# Patient Record
Sex: Female | Born: 1980
Health system: Southern US, Community
[De-identification: ages and names within clinical notes are randomized; demographics above are authoritative.]

## PROBLEM LIST (undated history)

## (undated) DIAGNOSIS — G35D Multiple sclerosis, unspecified: Secondary | ICD-10-CM

## (undated) DIAGNOSIS — I7300 Raynaud's syndrome without gangrene: Secondary | ICD-10-CM

## (undated) DIAGNOSIS — L439 Lichen planus, unspecified: Secondary | ICD-10-CM

## (undated) DIAGNOSIS — G35 Multiple sclerosis: Secondary | ICD-10-CM

## (undated) DIAGNOSIS — E282 Polycystic ovarian syndrome: Secondary | ICD-10-CM

## (undated) DIAGNOSIS — J45909 Unspecified asthma, uncomplicated: Secondary | ICD-10-CM

## (undated) HISTORY — PX: OTHER SURGICAL HISTORY: SHX169

## (undated) HISTORY — DX: Lichen planus, unspecified: L43.9

## (undated) HISTORY — DX: Raynaud's syndrome without gangrene: I73.00

## (undated) HISTORY — PX: DILATION AND CURETTAGE OF UTERUS: SHX78

## (undated) HISTORY — DX: Unspecified asthma, uncomplicated: J45.909

## (undated) HISTORY — PX: ESOPHAGOGASTRODUODENOSCOPY: SHX1529

## (undated) HISTORY — PX: TONSILLECTOMY AND ADENOIDECTOMY: SHX28

---
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
## 2013-03-25 ENCOUNTER — Emergency Department (HOSPITAL_COMMUNITY): Payer: 59

## 2013-03-25 ENCOUNTER — Encounter (HOSPITAL_COMMUNITY): Payer: Self-pay | Admitting: Emergency Medicine

## 2013-03-25 ENCOUNTER — Emergency Department (HOSPITAL_COMMUNITY)
Admission: EM | Admit: 2013-03-25 | Discharge: 2013-03-25 | Disposition: A | Discharge status: Home / Self Care | Payer: 59 | Attending: Emergency Medicine | Admitting: Emergency Medicine

## 2013-03-25 DIAGNOSIS — Z8742 Personal history of other diseases of the female genital tract: Secondary | ICD-10-CM | POA: Insufficient documentation

## 2013-03-25 DIAGNOSIS — S335XXA Sprain of ligaments of lumbar spine, initial encounter: Secondary | ICD-10-CM | POA: Insufficient documentation

## 2013-03-25 DIAGNOSIS — Z8669 Personal history of other diseases of the nervous system and sense organs: Secondary | ICD-10-CM | POA: Insufficient documentation

## 2013-03-25 DIAGNOSIS — S39012A Strain of muscle, fascia and tendon of lower back, initial encounter: Secondary | ICD-10-CM

## 2013-03-25 DIAGNOSIS — X500XXA Overexertion from strenuous movement or load, initial encounter: Secondary | ICD-10-CM | POA: Insufficient documentation

## 2013-03-25 DIAGNOSIS — Y929 Unspecified place or not applicable: Secondary | ICD-10-CM | POA: Insufficient documentation

## 2013-03-25 DIAGNOSIS — Y9389 Activity, other specified: Secondary | ICD-10-CM | POA: Insufficient documentation

## 2013-03-25 HISTORY — DX: Multiple sclerosis, unspecified: G35.D

## 2013-03-25 HISTORY — DX: Polycystic ovarian syndrome: E28.2

## 2013-03-25 HISTORY — DX: Multiple sclerosis: G35

## 2013-03-25 MED ORDER — KETOROLAC TROMETHAMINE 30 MG/ML IJ SOLN
30.0000 mg | Freq: Once | INTRAMUSCULAR | Status: AC
  Administered 2013-03-25: 30 mg via INTRAMUSCULAR
  Filled 2013-03-25: qty 1

## 2013-03-25 MED ORDER — OXYCODONE-ACETAMINOPHEN 5-325 MG PO TABS: 1.0000 | ORAL_TABLET | ORAL | Status: DC | PRN

## 2013-03-25 MED ORDER — DIAZEPAM 5 MG PO TABS: 5.0000 mg | ORAL_TABLET | Freq: Four times a day (QID) | ORAL | Status: DC | PRN

## 2013-03-25 NOTE — ED Notes (Signed)
Pt reports that she bent down yesterday after she came home from work. Reports that she has tried ice and heat at home. Reports that she has been taken ibuprofen at home, but has limited movement this morning. States that she can not sit comfortably this morning at all.

## 2013-03-25 NOTE — ED Notes (Signed)
MD at bedside. 

## 2013-03-25 NOTE — ED Provider Notes (Signed)
CSN: [id number]     Arrival date & time 03/25/13  0802 History   First MD Initiated Contact with Patient 03/25/13 [phone]     Chief Complaint  Patient presents with  . Back Pain   (Consider location/radiation/quality/duration/timing/severity/associated sxs/prior Treatment) HPI Comments: Patient presents to the ER for evaluation of sudden onset of severe low back pain yesterday. The patient reports that she bent over and felt sudden pain in the low back region. She did not feel a pop at the time of the onset of pain. She reports that the pain was constant and continuous. It has worsened overnight, now cannot bend over at all. Pain is radiating down into the gluteals bilaterally. No radiation into the thighs or lower legs. No loss of strength or sensation in the lower extremities. No numbness, tingling. She has not had any bowel or bladder incontinence.  Patient has been taking ibuprofen with only minimal relief. She has been icing and heating the area alternately.  Patient is a 33 y.o. female presenting with back pain.  Back Pain   Past Medical History  Diagnosis Date  . MS (multiple sclerosis)   . PCOS (polycystic ovarian syndrome)    History reviewed. No pertinent past surgical history. History reviewed. No pertinent family history. History  Substance Use Topics  . Smoking status: Never Smoker   . Smokeless tobacco: Not on file  . Alcohol Use: Not on file   OB History   Grav Para Term Preterm Abortions TAB SAB Ect Mult Living                 Review of Systems  Musculoskeletal: Positive for back pain.  All other systems reviewed and are negative.    Allergies  Review of patient's allergies indicates no known allergies.  Home Medications  No current outpatient prescriptions on file. BP 131/75  Temp(Src) 97.8 F (36.6 C) (Oral)  SpO2 99% Physical Exam  Constitutional: She is oriented to person, place, and time. She appears well-developed and well-nourished. No distress.    HENT:  Head: Normocephalic and atraumatic.  Right Ear: Hearing normal.  Left Ear: Hearing normal.  Nose: Nose normal.  Mouth/Throat: Oropharynx is clear and moist and mucous membranes are normal.  Eyes: Conjunctivae and EOM are normal. Pupils are equal, round, and reactive to light.  Neck: Normal range of motion. Neck supple.  Cardiovascular: Regular rhythm, S1 normal and S2 normal.  Exam reveals no gallop and no friction rub.   No murmur heard. Pulmonary/Chest: Effort normal and breath sounds normal. No respiratory distress. She exhibits no tenderness.  Abdominal: Soft. Normal appearance and bowel sounds are normal. There is no hepatosplenomegaly. There is no tenderness. There is no rebound, no guarding, no tenderness at McBurney's point and negative Murphy's sign. No hernia.  Musculoskeletal: Normal range of motion.       Lumbar back: She exhibits tenderness.       Back:  Neurological: She is alert and oriented to person, place, and time. She has normal strength. No cranial nerve deficit or sensory deficit. Coordination normal. GCS eye subscore is 4. GCS verbal subscore is 5. GCS motor subscore is 6.  Skin: Skin is warm, dry and intact. No rash noted. No cyanosis.     Psychiatric: She has a normal mood and affect. Her speech is normal and behavior is normal. Thought content normal.    ED Course  Procedures (including critical care time) Labs Review Labs Reviewed - No data to display Imaging Review Mr  Lumbar Spine Wo Contrast  03/25/2013   CLINICAL DATA:  Severe low back pain with radicular pain. History of multiple sclerosis.  EXAM: MRI LUMBAR SPINE WITHOUT CONTRAST  TECHNIQUE: Multiplanar, multisequence MR imaging was performed. No intravenous contrast was administered.  COMPARISON:  None.  FINDINGS: Five lumbar type vertebral bodies are assumed. The alignment is normal. There is no evidence of fracture or pars defect.  The conus medullaris extends to the upper L2 level and appears  normal. There are no significant paraspinal abnormalities. Mild subcutaneous edema is noted in the midline at L3-4.  There are no significant disc space findings from T11-12 through L2-3.  L3-4: Disc height and hydration are maintained. There is mild bilateral facet hypertrophy. No spinal stenosis or nerve root encroachment.  L4-5: Disc height and hydration are maintained. There is mild bilateral facet hypertrophy. No spinal stenosis or nerve root encroachment.  L5-S1: Disc height and hydration are maintained. There is a shallow central disc protrusion which is contained within the ventral epidural fat. There is no mass effect on the S1 nerve roots. Mild facet hypertrophy is present bilaterally. The foramina are patent.  IMPRESSION: 1. Small central disc protrusion at L5-S1 without resulting nerve root encroachment. 2. The additional discs appear normal. 3. Mild facet hypertrophy inferiorly. No evidence of nerve root encroachment.   Electronically Signed   By: Camie Patience M.D.   On: 03/25/2013 10:39    EKG Interpretation   None       MDM   1. Lumbar strain    Patient presents to the ER for evaluation of low back pain. Patient had sudden onset of pain after bending over which has progressively worsened. There is a slight radicular component with radiation to the gluteal region bilaterally. She does not have any neurologic deficit.  An MRI was performed to evaluate for nerve impingement. There is very slight disc protrusion at L5 that does not compress nerve roots. This is an essentially normal MRI without any findings to explain the patient's pain. Pain is therefore secondary to acute muscle injury. She'll be treated with rest, Percocet, Valium as needed for spasm.  The patient does have a previous history of MS, but as this is an acute onset, painful process or related to bending over, there is no concern that this is related to MS.    Orpah Greek, MD 03/25/13 1101

## 2013-03-25 NOTE — Discharge Instructions (Signed)
Back Pain, Adult Low back pain is very common. About 1 in 5 people have back pain.The cause of low back pain is rarely dangerous. The pain often gets better over time.About half of people with a sudden onset of back pain feel better in just 2 weeks. About 8 in 10 people feel better by 6 weeks.  CAUSES Some common causes of back pain include:  Strain of the muscles or ligaments supporting the spine.  Wear and tear (degeneration) of the spinal discs.  Arthritis.  Direct injury to the back. DIAGNOSIS Most of the time, the direct cause of low back pain is not known.However, back pain can be treated effectively even when the exact cause of the pain is unknown.Answering your caregiver's questions about your overall health and symptoms is one of the most accurate ways to make sure the cause of your pain is not dangerous. If your caregiver needs more information, he or she may order lab work or imaging tests (X-rays or MRIs).However, even if imaging tests show changes in your back, this usually does not require surgery. HOME CARE INSTRUCTIONS For many people, back pain returns.Since low back pain is rarely dangerous, it is often a condition that people can learn to manageon their own.   Remain active. It is stressful on the back to sit or stand in one place. Do not sit, drive, or stand in one place for more than 30 minutes at a time. Take short walks on level surfaces as soon as pain allows.Try to increase the length of time you walk each day.  Do not stay in bed.Resting more than 1 or 2 days can delay your recovery.  Do not avoid exercise or work.Your body is made to move.It is not dangerous to be active, even though your back may hurt.Your back will likely heal faster if you return to being active before your pain is gone.  Pay attention to your body when you bend and lift. Many people have less discomfortwhen lifting if they bend their knees, keep the load close to their bodies,and  avoid twisting. Often, the most comfortable positions are those that put less stress on your recovering back.  Find a comfortable position to sleep. Use a firm mattress and lie on your side with your knees slightly bent. If you lie on your back, put a pillow under your knees.  Only take over-the-counter or prescription medicines as directed by your caregiver. Over-the-counter medicines to reduce pain and inflammation are often the most helpful.Your caregiver may prescribe muscle relaxant drugs.These medicines help dull your pain so you can more quickly return to your normal activities and healthy exercise.  Put ice on the injured area.  Put ice in a plastic bag.  Place a towel between your skin and the bag.  Leave the ice on for 15-20 minutes, 03-04 times a day for the first 2 to 3 days. After that, ice and heat may be alternated to reduce pain and spasms.  Ask your caregiver about trying back exercises and gentle massage. This may be of some benefit.  Avoid feeling anxious or stressed.Stress increases muscle tension and can worsen back pain.It is important to recognize when you are anxious or stressed and learn ways to manage it.Exercise is a great option. SEEK MEDICAL CARE IF:  You have pain that is not relieved with rest or medicine.  You have pain that does not improve in 1 week.  You have new symptoms.  You are generally not feeling well. SEEK   IMMEDIATE MEDICAL CARE IF:   You have pain that radiates from your back into your legs.  You develop new bowel or bladder control problems.  You have unusual weakness or numbness in your arms or legs.  You develop nausea or vomiting.  You develop abdominal pain.  You feel faint. Document Released: 02/19/2005 Document Revised: 08/21/2011 Document Reviewed: 07/10/2010 [employer] Patient Information 2014 [employer].  

## 2014-10-21 ENCOUNTER — Ambulatory Visit (INDEPENDENT_AMBULATORY_CARE_PROVIDER_SITE_OTHER): Payer: 59 | Admitting: Family Medicine

## 2014-10-21 ENCOUNTER — Encounter: Payer: Self-pay | Admitting: Family Medicine

## 2014-10-21 VITALS — BP 110/82 | HR 87

## 2014-10-21 DIAGNOSIS — M9902 Segmental and somatic dysfunction of thoracic region: Secondary | ICD-10-CM

## 2014-10-21 DIAGNOSIS — M9903 Segmental and somatic dysfunction of lumbar region: Secondary | ICD-10-CM | POA: Diagnosis not present

## 2014-10-21 DIAGNOSIS — M999 Biomechanical lesion, unspecified: Secondary | ICD-10-CM | POA: Insufficient documentation

## 2014-10-21 DIAGNOSIS — M546 Pain in thoracic spine: Secondary | ICD-10-CM | POA: Diagnosis not present

## 2014-10-21 DIAGNOSIS — M9908 Segmental and somatic dysfunction of rib cage: Secondary | ICD-10-CM | POA: Diagnosis not present

## 2014-10-21 NOTE — Patient Instructions (Addendum)
Good to see you.  Ice 20 minutes 2 times daily. Usually after activity and before bed. Exercises 3 times a week.  Stand on wall with heels, butt shoulder and head touching for goal of 5 minutes a day Vitamin D continue Turmeric 500mg  twice daily Tennis ball between shoulder blades with doing notes.  See me again in 3-4 weeks.  Standing:  Secure a rubber exercise band/tubing so that it is at the height of your shoulders when you are either standing or sitting on a firm arm-less chair.  Grasp an end of the band/tubing in each hand and have your palms face each other. Straighten your elbows and lift your hands straight in front of you at shoulder height. Step back away from the secured end of band/tubing until it becomes tense.  Squeeze your shoulder blades together. Keeping your elbows locked and your hands at shoulder-height, bring your hands out to your side.  Hold __________ seconds. Slowly ease the tension on the band/tubing as you reverse the directions and return to the starting position. Repeat __________ times. Complete this exercise __________ times per day. STRENGTH - Scapular Retractors  Secure a rubber exercise band/tubing so that it is at the height of your shoulders when you are either standing or sitting on a firm arm-less chair.  With a palm-down grip, grasp an end of the band/tubing in each hand. Straighten your elbows and lift your hands straight in front of you at shoulder height. Step back away from the secured end of band/tubing until it becomes tense.  Squeezing your shoulder blades together, draw your elbows back as you bend them. Keep your upper arm lifted away from your body throughout the exercise.  Hold __________ seconds. Slowly ease the tension on the band/tubing as you reverse the directions and return to the starting position. Repeat __________ times. Complete this exercise __________ times per day. STRENGTH - Shoulder Extensors   Secure a rubber exercise  band/tubing so that it is at the height of your shoulders when you are either standing or sitting on a firm arm-less chair.  With a thumbs-up grip, grasp an end of the band/tubing in each hand. Straighten your elbows and lift your hands straight in front of you at shoulder height. Step back away from the secured end of band/tubing until it becomes tense.  Squeezing your shoulder blades together, pull your hands down to the sides of your thighs. Do not allow your hands to go behind you.  Hold for __________ seconds. Slowly ease the tension on the band/tubing as you reverse the directions and return to the starting position. Repeat __________ times. Complete this exercise __________ times per day.  STRENGTH - Scapular Retractors and External Rotators  Secure a rubber exercise band/tubing so that it is at the height of your shoulders when you are either standing or sitting on a firm arm-less chair.  With a palm-down grip, grasp an end of the band/tubing in each hand. Bend your elbows 90 degrees and lift your elbows to shoulder height at your sides. Step back away from the secured end of band/tubing until it becomes tense.  Squeezing your shoulder blades together, rotate your shoulder so that your upper arm and elbow remain stationary, but your fists travel upward to head-height.  Hold __________ for seconds. Slowly ease the tension on the band/tubing as you reverse the directions and return to the starting position. Repeat __________ times. Complete this exercise __________ times per day.  STRENGTH - Scapular Retractors and External  Rotators, Rowing  Secure a rubber exercise band/tubing so that it is at the height of your shoulders when you are either standing or sitting on a firm arm-less chair.  With a palm-down grip, grasp an end of the band/tubing in each hand. Straighten your elbows and lift your hands straight in front of you at shoulder height. Step back away from the secured end of  band/tubing until it becomes tense.  Step 1: Squeeze your shoulder blades together. Bending your elbows, draw your hands to your chest as if you are rowing a boat. At the end of this motion, your hands and elbow should be at shoulder-height and your elbows should be out to your sides.  Step 2: Rotate your shoulder to raise your hands above your head. Your forearms should be vertical and your upper-arms should be horizontal.  Hold for __________ seconds. Slowly ease the tension on the band/tubing as you reverse the directions and return to the starting position. Repeat __________ times. Complete this exercise __________ times per day.  STRENGTH - Scapular Retractors and Elevators  Secure a rubber exercise band/tubing so that it is at the height of your shoulders when you are either standing or sitting on a firm arm-less chair.  With a thumbs-up grip, grasp an end of the band/tubing in each hand. Step back away from the secured end of band/tubing until it becomes tense.  Squeezing your shoulder blades together, straighten your elbows and lift your hands straight over your head.  Hold for __________ seconds. Slowly ease the tension on the band/tubing as you reverse the directions and return to the starting position. Repeat __________ times. Complete this exercise __________ times per day.  Document Released: 02/19/2005 Document Revised: 05/14/2011 Document Reviewed: 06/03/2008 [facility] Patient Information 2015 [place], [place]. This information is not intended to replace advice given to you by your health care provider. Make sure you discuss any questions you have with your health care provider.

## 2014-10-21 NOTE — Assessment & Plan Note (Signed)
Patient's thoracic back pain is multifactorial but mostly muscle imbalances. We discussed ergonomic changes at work, home exercises, and patient work with Product/process development scientist today. We discussed icing regimen and over-the-counter natural supplementations. Patient given a trial of oral anti-inflammatory's when needed. Patient and will come back and see me again in 3 weeks for further evaluation and treatment.

## 2014-10-21 NOTE — Progress Notes (Signed)
[name] [address], [place] [address] Phone: [phone] Subjective:     CC: Back and neck pain  [id number] Kierstan Auer is a 34 y.o. female coming in with complaint of back and upper neck pain. Patient does have a past medical history significant for multiple sclerosis that is well controlled. Patient states that overall she has been doing relatively well. States though that she does have upper back pain that seems to be worse after working many days in a row. Patient describes it as a dull throbbing ache with no radiation down the arms or any numbness or tingling. Used to respond well to manipulation. In addition of this patient also has been going to massage which has been beneficial. Patient is looking for direction and how to control this on a more regular basis. Patient still able to do daily activities without any difficulty and no nighttime awakening. Rates the severity of 4 out of 10. Like to avoid anti-inflammatory when possible.  Past Medical History  Diagnosis Date  . MS (multiple sclerosis)   . PCOS (polycystic ovarian syndrome)   . Asthma    Past Surgical History  Procedure Laterality Date  . Tonsillectomy  1986   Social History   Social History  . Marital Status: Single    Spouse Name: N/A  . Number of Children: N/A  . Years of Education: N/A   Occupational History  . Not on file.   Social History Main Topics  . Smoking status: Never Smoker   . Smokeless tobacco: Not on file  . Alcohol Use: Not on file  . Drug Use: No  . Sexual Activity: Not on file   Other Topics Concern  . Not on file   Social History Narrative   No Known Allergies History reviewed. No pertinent family history.      Past medical history, social, surgical and family history all reviewed in electronic medical record.   Review of Systems: No headache, visual changes, nausea, vomiting, diarrhea, constipation, dizziness, abdominal  pain, skin rash, fevers, chills, night sweats, weight loss, swollen lymph nodes, body aches, joint swelling, muscle aches, chest pain, shortness of breath, mood changes.   Objective Blood pressure 110/82, pulse 87, SpO2 99 %.  General: No apparent distress alert and oriented x3 mood and affect normal, dressed appropriately.  HEENT: Pupils equal, extraocular movements intact  Respiratory: Patient's speak in full sentences and does not appear short of breath  Cardiovascular: No lower extremity edema, non tender, no erythema  Skin: Warm dry intact with no signs of infection or rash on extremities or on axial skeleton.  Abdomen: Soft nontender  Neuro: Cranial nerves II through XII are intact, neurovascularly intact in all extremities with 2+ DTRs and 2+ pulses.  Lymph: No lymphadenopathy of posterior or anterior cervical chain or axillae bilaterally.  Gait normal with good balance and coordination.  MSK:  Non tender with full range of motion and good stability and symmetric strength and tone of shoulders, elbows, wrist, hip, knee and ankles bilaterally.  Back Exam:  Inspection: Unremarkable  Motion: Flexion 45 deg, Extension 45 deg, Side Bending to 45 deg bilaterally,  Rotation to 45 deg bilaterally  SLR laying: Negative  XSLR laying: Negative  Palpable tenderness:  FABER: negative. Sensory change: Gross sensation intact to all lumbar and sacral dermatomes.  Reflexes: 2+ at both patellar tendons, 2+ at achilles tendons, Babinski's downgoing.  Strength at foot  Plantar-flexion: 5/5 Dorsi-flexion: 5/5 Eversion: 5/5 Inversion:  5/5  Leg strength  Quad: 5/5 Hamstring: 5/5 Hip flexor: 5/5 Hip abductors: 5/5  Gait unremarkable.  OMT Physical Exam  Standing structural       Occiput neutral  Shoulder right higher  Illiac crest right higher  Medial malleolus  Standing flexion right   Seated Flexion right  Cervical  C3 flexed rotated and side bent left C4 flexed rotated and side bent  right  Thoracic T3 extended rotated and side bent right T5 extended rotated and side bent left with inhaled fifth rib  Lumbar L2 flexed rotated and side bent right  Sacrum Left on left       Impression and Recommendations:     This case required medical decision making of moderate complexity.

## 2014-10-21 NOTE — Progress Notes (Signed)
Pre visit review using our clinic review tool, if applicable. No additional management support is needed unless otherwise documented below in the visit note. 

## 2014-10-21 NOTE — Assessment & Plan Note (Addendum)
Decision today to treat with OMT was based on Physical Exam  After verbal consent patient was treated with HVLA, ME techniques in rib (ME only), thoracic and lumbar areas  Patient tolerated the procedure well with improvement in symptoms  Patient given exercises, stretches and lifestyle modifications  See medications in patient instructions if given  Patient will follow up in 3 weeks

## 2014-11-17 ENCOUNTER — Ambulatory Visit: Payer: 59 | Admitting: Family Medicine

## 2014-11-19 ENCOUNTER — Ambulatory Visit: Payer: 59 | Admitting: Family Medicine

## 2014-12-01 ENCOUNTER — Ambulatory Visit (INDEPENDENT_AMBULATORY_CARE_PROVIDER_SITE_OTHER): Payer: 59 | Admitting: Family Medicine

## 2014-12-01 ENCOUNTER — Encounter: Payer: Self-pay | Admitting: Family Medicine

## 2014-12-01 VITALS — BP 122/84 | HR 87

## 2014-12-01 DIAGNOSIS — M9902 Segmental and somatic dysfunction of thoracic region: Secondary | ICD-10-CM | POA: Diagnosis not present

## 2014-12-01 DIAGNOSIS — M546 Pain in thoracic spine: Secondary | ICD-10-CM | POA: Diagnosis not present

## 2014-12-01 DIAGNOSIS — M9908 Segmental and somatic dysfunction of rib cage: Secondary | ICD-10-CM

## 2014-12-01 DIAGNOSIS — M9903 Segmental and somatic dysfunction of lumbar region: Secondary | ICD-10-CM

## 2014-12-01 DIAGNOSIS — M999 Biomechanical lesion, unspecified: Secondary | ICD-10-CM

## 2014-12-01 NOTE — Assessment & Plan Note (Signed)
Patient will continue to have to work on posture already. Patient is doing 50,000 units of vitamin D but has not had it checked recently. We discussed the possibility of adding and over-the-counter vitamins that could be beneficial. Discussed different changes she can make throughout the day but can be helpful. We also discussed iron supple mentation with patient having some mild muscle spasm. Patient had a make these changes and come back and see me again in 4 weeks for further evaluation and treatment.

## 2014-12-01 NOTE — Progress Notes (Signed)
[name] [address] [place], [place] [address] Phone: [phone] Subjective:     CC: Back and neck pain follow up  [id number] Delle Andrzejewski is a 34 y.o. female coming in with complaint of back and upper neck pain. Patient does have a past medical history significant for multiple sclerosis that is well controlled. Had poor posture and responded well to OMT.   Past Medical History  Diagnosis Date  . MS (multiple sclerosis)   . PCOS (polycystic ovarian syndrome)   . Asthma    Past Surgical History  Procedure Laterality Date  . Tonsillectomy  1986   Social History   Social History  . Marital Status: Single    Spouse Name: N/A  . Number of Children: N/A  . Years of Education: N/A   Occupational History  . Not on file.   Social History Main Topics  . Smoking status: Never Smoker   . Smokeless tobacco: Not on file  . Alcohol Use: Not on file  . Drug Use: No  . Sexual Activity: Not on file   Other Topics Concern  . Not on file   Social History Narrative   No Known Allergies No family history on file.      Past medical history, social, surgical and family history all reviewed in electronic medical record.   Review of Systems: No headache, visual changes, nausea, vomiting, diarrhea, constipation, dizziness, abdominal pain, skin rash, fevers, chills, night sweats, weight loss, swollen lymph nodes, body aches, joint swelling, muscle aches, chest pain, shortness of breath, mood changes.   Objective Blood pressure 122/84, pulse 87, SpO2 98 %.  General: No apparent distress alert and oriented x3 mood and affect normal, dressed appropriately.  HEENT: Pupils equal, extraocular movements intact  Respiratory: Patient's speak in full sentences and does not appear short of breath  Cardiovascular: No lower extremity edema, non tender, no erythema  Skin: Warm dry intact with no signs of infection or rash on extremities or on axial  skeleton.  Abdomen: Soft nontender  Neuro: Cranial nerves II through XII are intact, neurovascularly intact in all extremities with 2+ DTRs and 2+ pulses.  Lymph: No lymphadenopathy of posterior or anterior cervical chain or axillae bilaterally.  Gait normal with good balance and coordination.  MSK:  Non tender with full range of motion and good stability and symmetric strength and tone of shoulders, elbows, wrist, hip, knee and ankles bilaterally.  Back Exam:  Inspection: Unremarkable  Motion: Flexion 45 deg, Extension 45 deg, Side Bending to 45 deg bilaterally,  Rotation to 45 deg bilaterally  SLR laying: Negative  XSLR laying: Negative  Palpable tenderness:  FABER: negative. Sensory change: Gross sensation intact to all lumbar and sacral dermatomes.  Reflexes: 2+ at both patellar tendons, 2+ at achilles tendons, Babinski's downgoing.  Strength at foot  Plantar-flexion: 5/5 Dorsi-flexion: 5/5 Eversion: 5/5 Inversion: 5/5  Leg strength  Quad: 5/5 Hamstring: 5/5 Hip flexor: 5/5 Hip abductors: 5/5  Gait unremarkable.  OMT Physical Exam  Standing structural       Occiput neutral  Shoulder right higher  Illiac crest right higher  Medial malleolus  Standing flexion right   Seated Flexion right  Cervical  C3 flexed rotated and side bent left C4 flexed rotated and side bent right  Thoracic T3 extended rotated and side bent right T5 extended rotated and side bent left with inhaled fifth rib  Lumbar L2 flexed rotated and side bent right  Sacrum Left on  left       Impression and Recommendations:     This case required medical decision making of moderate complexity.

## 2014-12-01 NOTE — Assessment & Plan Note (Signed)
Decision today to treat with OMT was based on Physical Exam  After verbal consent patient was treated with HVLA, ME techniques in rib (ME only), thoracic and lumbar areas  Patient tolerated the procedure well with improvement in symptoms  Patient given exercises, stretches and lifestyle modifications  See medications in patient instructions if given  Patient will follow up in 4 weeks

## 2014-12-01 NOTE — Patient Instructions (Addendum)
Good to see you We still got some work to do on the posture.  Ice when you need it.  Consider iron 65mg  elemental 3 times a week.  Keep working at it If getting labs look at maybe iron panel, TSH, Vit D,  K2 is a vitmain See me again in 4 weeks.

## 2014-12-01 NOTE — Progress Notes (Signed)
Pre visit review using our clinic review tool, if applicable. No additional management support is needed unless otherwise documented below in the visit note. 

## 2014-12-29 ENCOUNTER — Ambulatory Visit (INDEPENDENT_AMBULATORY_CARE_PROVIDER_SITE_OTHER): Payer: 59 | Admitting: Family Medicine

## 2014-12-29 ENCOUNTER — Encounter: Payer: Self-pay | Admitting: Family Medicine

## 2014-12-29 VITALS — BP 126/76 | HR 76

## 2014-12-29 DIAGNOSIS — M9903 Segmental and somatic dysfunction of lumbar region: Secondary | ICD-10-CM | POA: Diagnosis not present

## 2014-12-29 DIAGNOSIS — M9902 Segmental and somatic dysfunction of thoracic region: Secondary | ICD-10-CM | POA: Diagnosis not present

## 2014-12-29 DIAGNOSIS — M9908 Segmental and somatic dysfunction of rib cage: Secondary | ICD-10-CM | POA: Diagnosis not present

## 2014-12-29 DIAGNOSIS — M546 Pain in thoracic spine: Secondary | ICD-10-CM

## 2014-12-29 DIAGNOSIS — M999 Biomechanical lesion, unspecified: Secondary | ICD-10-CM

## 2014-12-29 NOTE — Patient Instructions (Addendum)
Great to see you! Keep working on the posture Standing on the wall See me again 4-6 weeks!

## 2014-12-29 NOTE — Assessment & Plan Note (Signed)
Decision today to treat with OMT was based on Physical Exam  After verbal consent patient was treated with HVLA, ME techniques in rib (ME only), thoracic and lumbar areas  Patient tolerated the procedure well with improvement in symptoms  Patient given exercises, stretches and lifestyle modifications  See medications in patient instructions if given  Patient will follow up in 4 weeks

## 2014-12-29 NOTE — Progress Notes (Signed)
[name] [address] [place], [place] [address] Phone: [phone] Subjective:     CC: Back and neck pain follow up  [id number] Martha [name] is a 34 y.o. female coming in with complaint of back and upper neck pain. Patient does have a past medical history significant for multiple sclerosis that is well controlled. Had poor posture and responded well to OMT.  Patient hasn't remained reactive. Has not had the rib slip as frequently she states. Patient did do a lot of sitting the last week and states that that may give her some increasing discomfort but nothing that was debilitating. Patient denies any new symptoms. Overall fairly happy with the results of treatment so far.  Past Medical History  Diagnosis Date  . MS (multiple sclerosis) ([place])   . PCOS (polycystic ovarian syndrome)   . Asthma    Past Surgical History  Procedure Laterality Date  . Tonsillectomy  1986   Social History   Social History  . Marital Status: Single    Spouse Name: N/A  . Number of Children: N/A  . Years of Education: N/A   Occupational History  . Not on file.   Social History Main Topics  . Smoking status: Never Smoker   . Smokeless tobacco: Not on file  . Alcohol Use: Not on file  . Drug Use: No  . Sexual Activity: Not on file   Other Topics Concern  . Not on file   Social History Narrative   No Known Allergies History reviewed. No pertinent family history.      Past medical history, social, surgical and family history all reviewed in electronic medical record.   Review of Systems: No headache, visual changes, nausea, vomiting, diarrhea, constipation, dizziness, abdominal pain, skin rash, fevers, chills, night sweats, weight loss, swollen lymph nodes, body aches, joint swelling, muscle aches, chest pain, shortness of breath, mood changes.   Objective Blood pressure 126/76, pulse 76.  General: No apparent distress alert and oriented x3 mood and  affect normal, dressed appropriately.  HEENT: Pupils equal, extraocular movements intact  Respiratory: Patient's speak in full sentences and does not appear short of breath  Cardiovascular: No lower extremity edema, non tender, no erythema  Skin: Warm dry intact with no signs of infection or rash on extremities or on axial skeleton.  Abdomen: Soft nontender  Neuro: Cranial nerves II through XII are intact, neurovascularly intact in all extremities with 2+ DTRs and 2+ pulses.  Lymph: No lymphadenopathy of posterior or anterior cervical chain or axillae bilaterally.  Gait normal with good balance and coordination.  MSK:  Non tender with full range of motion and good stability and symmetric strength and tone of shoulders, elbows, wrist, hip, knee and ankles bilaterally.  Back Exam:  Inspection: Unremarkable  Motion: Flexion 45 deg, Extension 45 deg, Side Bending to 45 deg bilaterally,  Rotation to 45 deg bilaterally  SLR laying: Negative  XSLR laying: Negative  Palpable tenderness:  FABER: negative. Sensory change: Gross sensation intact to all lumbar and sacral dermatomes.  Reflexes: 2+ at both patellar tendons, 2+ at achilles tendons, Babinski's downgoing.  Strength at foot  Plantar-flexion: 5/5 Dorsi-flexion: 5/5 Eversion: 5/5 Inversion: 5/5  Leg strength  Quad: 5/5 Hamstring: 5/5 Hip flexor: 5/5 Hip abductors: 5/5  Gait unremarkable.  OMT Physical Exam   Standing flexion right   Seated Flexion right  Cervical  C3 flexed rotated and side bent left C4 flexed rotated and side bent right  Thoracic  T3 extended rotated and side bent right T5 extended rotated and side bent left with inhaled fifth rib 7 extended rotated and side bent left  Lumbar L2 flexed rotated and side bent right  Sacrum Left on left     Impression and Recommendations:     This case required medical decision making of moderate complexity.

## 2014-12-29 NOTE — Assessment & Plan Note (Signed)
Patient is doing remarkably well. We discussed again about possible changes in posture and ergonomics are at the data will be beneficial. We discussed icing regimen. Patient will try to make these changes and come back and see me again in 4-6 weeks for further evaluation and treatment.

## 2015-02-14 ENCOUNTER — Ambulatory Visit (INDEPENDENT_AMBULATORY_CARE_PROVIDER_SITE_OTHER): Payer: 59 | Admitting: Family Medicine

## 2015-02-14 ENCOUNTER — Encounter: Payer: Self-pay | Admitting: Family Medicine

## 2015-02-14 VITALS — BP 102/74 | HR 84

## 2015-02-14 DIAGNOSIS — M999 Biomechanical lesion, unspecified: Secondary | ICD-10-CM

## 2015-02-14 DIAGNOSIS — M9902 Segmental and somatic dysfunction of thoracic region: Secondary | ICD-10-CM | POA: Diagnosis not present

## 2015-02-14 DIAGNOSIS — M9903 Segmental and somatic dysfunction of lumbar region: Secondary | ICD-10-CM

## 2015-02-14 DIAGNOSIS — M9908 Segmental and somatic dysfunction of rib cage: Secondary | ICD-10-CM | POA: Diagnosis not present

## 2015-02-14 DIAGNOSIS — M546 Pain in thoracic spine: Secondary | ICD-10-CM | POA: Diagnosis not present

## 2015-02-14 DIAGNOSIS — G35 Multiple sclerosis: Secondary | ICD-10-CM

## 2015-02-14 MED ORDER — HYDROXYZINE HCL 25 MG PO TABS
25.0000 mg | ORAL_TABLET | Freq: Three times a day (TID) | ORAL | Status: DC | PRN
Start: 2015-02-14 — End: 2016-02-21

## 2015-02-14 NOTE — Progress Notes (Signed)
Pre visit review using our clinic review tool, if applicable. No additional management support is needed unless otherwise documented below in the visit note. 

## 2015-02-14 NOTE — Assessment & Plan Note (Signed)
Patient will continue to have this muscle imbalances. We discussed postural changes in patient to make throughout the day. Patient given hydroxyzine to see if this will be beneficial to help with as a muscle relaxer. This may help her with sleep as well which she is having difficulty to secondary to the discomfort. Patient is denying any headaches. No weakness of the external knees. Do not feel that we need a workup for multiple sclerosis at this time. Continue with the vitamin D supplementation. I would like to increase her frequency of office visit do 3-4 week intervals.

## 2015-02-14 NOTE — Patient Instructions (Signed)
Great to see you Stay active  Martha [name] is your friend Stay active and work on posture Hydroxyzine up to 3 times daily Nexium 2 times daily for next week.  See me again in 2-3 weeks.

## 2015-02-14 NOTE — Progress Notes (Signed)
[name] [address], [place] [address] Phone: [phone] Subjective:     CC: Back and neck pain follow up  [id number] Rushika Haberl is a 34 y.o. female coming in with complaint of back and upper neck pain. Patient does have a past medical history significant for multiple sclerosis that is well controlled. Had poor posture and responded well to OMT.  Patient has had some difficulty. Having increasing stress as well. Patient states that this time year is always difficult. Patient has been feeling significant a more fatigued. No significant weakness. No signs or symptoms of possible multiple sclerosis flare. Patient though states that the upper back has been significantly more stress. No radiation to the arms. Patient is also complaining a wedding which is difficult.  Past Medical History  Diagnosis Date  . MS (multiple sclerosis) ([place])   . PCOS (polycystic ovarian syndrome)   . Asthma    Past Surgical History  Procedure Laterality Date  . Tonsillectomy  1986   Social History   Social History  . Marital Status: Single    Spouse Name: N/A  . Number of Children: N/A  . Years of Education: N/A   Occupational History  . Not on file.   Social History Main Topics  . Smoking status: Never Smoker   . Smokeless tobacco: Not on file  . Alcohol Use: Not on file  . Drug Use: No  . Sexual Activity: Not on file   Other Topics Concern  . Not on file   Social History Narrative   No Known Allergies No family history on file.      Past medical history, social, surgical and family history all reviewed in electronic medical record.   Review of Systems: No headache, visual changes, nausea, vomiting, diarrhea, constipation, dizziness, abdominal pain, skin rash, fevers, chills, night sweats, weight loss, swollen lymph nodes, body aches, joint swelling, muscle aches, chest pain, shortness of breath, mood changes.   Objective Blood  pressure 102/74, pulse 84, SpO2 97 %.  General: No apparent distress alert and oriented x3 mood and affect normal, dressed appropriately.  HEENT: Pupils equal, extraocular movements intact  Respiratory: Patient's speak in full sentences and does not appear short of breath  Cardiovascular: No lower extremity edema, non tender, no erythema  Skin: Warm dry intact with no signs of infection or rash on extremities or on axial skeleton.  Abdomen: Soft nontender  Neuro: Cranial nerves II through XII are intact, neurovascularly intact in all extremities with 2+ DTRs and 2+ pulses.  Lymph: No lymphadenopathy of posterior or anterior cervical chain or axillae bilaterally.  Gait normal with good balance and coordination.  MSK:  Non tender with full range of motion and good stability and symmetric strength and tone of shoulders, elbows, wrist, hip, knee and ankles bilaterally.  Back Exam:  Inspection: Unremarkable  Motion: Flexion 45 deg, Extension 45 deg, Side Bending to 45 deg bilaterally,  Rotation to 45 deg bilaterally  SLR laying: Negative  XSLR laying: Negative  Palpable tenderness: Tenderness of the upper thoracic spine in the paraspinal musculature. FABER: negative. Sensory change: Gross sensation intact to all lumbar and sacral dermatomes.  Reflexes: 2+ at both patellar tendons, 2+ at achilles tendons, Babinski's downgoing.  Strength at foot  Plantar-flexion: 5/5 Dorsi-flexion: 5/5 Eversion: 5/5 Inversion: 5/5  Leg strength  Quad: 5/5 Hamstring: 5/5 Hip flexor: 5/5 Hip abductors: 5/5  Gait unremarkable. Neck: Inspection unremarkable. No palpable stepoffs. Negative Spurling's maneuver. Mild decrease  range of motion in 3 with rotation bilaterally. Grip strength and sensation normal in bilateral hands Strength good C4 to T1 distribution No sensory change to C4 to T1 Negative Hoffman sign bilaterally Reflexes normal  OMT Physical Exam   Standing flexion right   Seated Flexion  right  Cervical  C3 flexed rotated and side bent left C4 flexed rotated and side bent right C7 flexed rotated and side bent left  Thoracic T3 extended rotated and side bent right T5 extended rotated and side bent left with inhaled fifth rib 7 extended rotated and side bent left  Lumbar L2 flexed rotated and side bent right  Sacrum Left on left    Impression and Recommendations:     This case required medical decision making of moderate complexity.

## 2015-02-14 NOTE — Assessment & Plan Note (Signed)
Decision today to treat with OMT was based on Physical Exam  After verbal consent patient was treated with HVLA, ME techniques in rib (ME only), thoracic and lumbar areas  Patient tolerated the procedure well with improvement in symptoms  Patient given exercises, stretches and lifestyle modifications  See medications in patient instructions if given  Patient will follow up in 4 weeks      

## 2015-02-15 ENCOUNTER — Ambulatory Visit: Payer: 59 | Admitting: Family Medicine

## 2015-02-25 ENCOUNTER — Encounter: Payer: Self-pay | Admitting: Family Medicine

## 2015-02-25 ENCOUNTER — Ambulatory Visit (INDEPENDENT_AMBULATORY_CARE_PROVIDER_SITE_OTHER): Payer: 59 | Admitting: Family Medicine

## 2015-02-25 VITALS — BP 110/76 | HR 85

## 2015-02-25 DIAGNOSIS — M9902 Segmental and somatic dysfunction of thoracic region: Secondary | ICD-10-CM | POA: Diagnosis not present

## 2015-02-25 DIAGNOSIS — M999 Biomechanical lesion, unspecified: Secondary | ICD-10-CM

## 2015-02-25 DIAGNOSIS — K2100 Gastro-esophageal reflux disease with esophagitis, without bleeding: Secondary | ICD-10-CM

## 2015-02-25 DIAGNOSIS — K219 Gastro-esophageal reflux disease without esophagitis: Secondary | ICD-10-CM | POA: Insufficient documentation

## 2015-02-25 DIAGNOSIS — M9903 Segmental and somatic dysfunction of lumbar region: Secondary | ICD-10-CM | POA: Diagnosis not present

## 2015-02-25 DIAGNOSIS — M9908 Segmental and somatic dysfunction of rib cage: Secondary | ICD-10-CM | POA: Diagnosis not present

## 2015-02-25 DIAGNOSIS — M546 Pain in thoracic spine: Secondary | ICD-10-CM

## 2015-02-25 MED ORDER — ESOMEPRAZOLE MAGNESIUM 40 MG PO CPDR: 40.0000 mg | DELAYED_RELEASE_CAPSULE | Freq: Every day | ORAL | Status: DC

## 2015-02-25 NOTE — Assessment & Plan Note (Signed)
Decision today to treat with OMT was based on Physical Exam  After verbal consent patient was treated with HVLA, ME techniques in rib (ME only), thoracic and lumbar areas  Patient tolerated the procedure well with improvement in symptoms  Patient given exercises, stretches and lifestyle modifications  See medications in patient instructions if given  Patient will follow up in 4 weeks      

## 2015-02-25 NOTE — Progress Notes (Signed)
Pre visit review using our clinic review tool, if applicable. No additional management support is needed unless otherwise documented below in the visit note. 

## 2015-02-25 NOTE — Progress Notes (Signed)
[name] [address] [place], [place] [address] Phone: [phone] Subjective:     CC: Back and neck pain follow up  [id number] Salam Alphonso is a 34 y.o. female coming in with complaint of back and upper neck pain. Patient does have a past medical history significant for multiple sclerosis that is well controlled. Had poor posture and responded well to OMT.  Patient was having some gastroesophageal reflux disease as well as some increasing anxiety at last episode. States that the medications have been helpful. Continues to have stress though with her wedding planing. About to start a 8 day work week. Some stiffness but overall feels like she is doing relatively well. Not doing the exercises regularly.  Past Medical History  Diagnosis Date  . MS (multiple sclerosis) ([place])   . PCOS (polycystic ovarian syndrome)   . Asthma    Past Surgical History  Procedure Laterality Date  . Tonsillectomy  1986   Social History   Social History  . Marital Status: Single    Spouse Name: N/A  . Number of Children: N/A  . Years of Education: N/A   Occupational History  . Not on file.   Social History Main Topics  . Smoking status: Never Smoker   . Smokeless tobacco: Not on file  . Alcohol Use: Not on file  . Drug Use: No  . Sexual Activity: Not on file   Other Topics Concern  . Not on file   Social History Narrative   No Known Allergies No family history on file.      Past medical history, social, surgical and family history all reviewed in electronic medical record.   Review of Systems: No headache, visual changes, nausea, vomiting, diarrhea, constipation, dizziness, abdominal pain, skin rash, fevers, chills, night sweats, weight loss, swollen lymph nodes, body aches, joint swelling, muscle aches, chest pain, shortness of breath, mood changes.   Objective Blood pressure 110/76, pulse 85, SpO2 98 %.  General: No apparent distress alert and  oriented x3 mood and affect normal, dressed appropriately.  HEENT: Pupils equal, extraocular movements intact  Respiratory: Patient's speak in full sentences and does not appear short of breath  Cardiovascular: No lower extremity edema, non tender, no erythema  Skin: Warm dry intact with no signs of infection or rash on extremities or on axial skeleton.  Abdomen: Soft nontender  Neuro: Cranial nerves II through XII are intact, neurovascularly intact in all extremities with 2+ DTRs and 2+ pulses.  Lymph: No lymphadenopathy of posterior or anterior cervical chain or axillae bilaterally.  Gait normal with good balance and coordination.  MSK:  Non tender with full range of motion and good stability and symmetric strength and tone of shoulders, elbows, wrist, hip, knee and ankles bilaterally.  Back Exam:  Inspection: Unremarkable  Motion: Flexion 45 deg, Extension 45 deg, Side Bending to 45 deg bilaterally,  Rotation to 45 deg bilaterally  SLR laying: Negative  XSLR laying: Negative  Palpable tenderness: Tenderness of the upper thoracic spine in the paraspinal musculature. FABER: negative. Sensory change: Gross sensation intact to all lumbar and sacral dermatomes.  Reflexes: 2+ at both patellar tendons, 2+ at achilles tendons, Babinski's downgoing.  Strength at foot  Plantar-flexion: 5/5 Dorsi-flexion: 5/5 Eversion: 5/5 Inversion: 5/5  Leg strength  Quad: 5/5 Hamstring: 5/5 Hip flexor: 5/5 Hip abductors: 5/5  Gait unremarkable. Neck: Inspection unremarkable. No palpable stepoffs. Negative Spurling's maneuver. Increased range of motion from previous exam Grip strength and sensation  normal in bilateral hands Strength good C4 to T1 distribution No sensory change to C4 to T1 Negative Hoffman sign bilaterally Reflexes normal  OMT Physical Exam   Standing flexion right   Seated Flexion right  Cervical  C3 flexed rotated and side bent left C4 flexed rotated and side bent  right C45flexed rotated and side bent left  Thoracic T3 extended rotated and side bent right T5 extended rotated and side bent left with inhaled fifth rib T9 extended rotated and side bent left  Lumbar L2 flexed rotated and side bent right  Sacrum Left on left    Impression and Recommendations:     This case required medical decision making of moderate complexity.

## 2015-02-25 NOTE — Assessment & Plan Note (Signed)
Patient is doing somewhat better overall. I do think that some of it is from the systematic. Patient is not having any signs of flare of the multiple sclerosis. We discussed icing regimen for the back. Patient given medication for the gastroesophageal reflux disease. Discussed trying to do the exercises and different ergonomic changes she can make to the day. Patient will come back and see me again in 3-4 weeks for further evaluation and treatment.

## 2015-02-25 NOTE — Patient Instructions (Addendum)
Good to see you Ice is your friend Hydroxyzine 1/2 tab during the day Nexium 40mg  daily See me again in 3-4 weeks

## 2015-03-14 MED FILL — SAXENDA 18 MG/3 ML PEN: 18 | 28 days supply | Qty: 15 | Fill #1

## 2015-03-24 ENCOUNTER — Ambulatory Visit (INDEPENDENT_AMBULATORY_CARE_PROVIDER_SITE_OTHER): Payer: 59 | Admitting: Family Medicine

## 2015-03-24 ENCOUNTER — Encounter: Payer: Self-pay | Admitting: Family Medicine

## 2015-03-24 VITALS — BP 124/82 | HR 87

## 2015-03-24 DIAGNOSIS — M546 Pain in thoracic spine: Secondary | ICD-10-CM | POA: Diagnosis not present

## 2015-03-24 DIAGNOSIS — M9904 Segmental and somatic dysfunction of sacral region: Secondary | ICD-10-CM | POA: Diagnosis not present

## 2015-03-24 DIAGNOSIS — M9903 Segmental and somatic dysfunction of lumbar region: Secondary | ICD-10-CM

## 2015-03-24 DIAGNOSIS — M9902 Segmental and somatic dysfunction of thoracic region: Secondary | ICD-10-CM | POA: Diagnosis not present

## 2015-03-24 DIAGNOSIS — M999 Biomechanical lesion, unspecified: Secondary | ICD-10-CM

## 2015-03-24 NOTE — Assessment & Plan Note (Signed)
Decision today to treat with OMT was based on Physical Exam  After verbal consent patient was treated with HVLA, ME techniques in rib (ME only), thoracic and lumbar areas  Patient tolerated the procedure well with improvement in symptoms  Patient given exercises, stretches and lifestyle modifications  See medications in patient instructions if given  Patient will follow up in 4-6 weeks

## 2015-03-24 NOTE — Assessment & Plan Note (Signed)
Overall significant improvement, discussed posture again and ovrall though has made improvement.  No sign of any significant flare Return to clinic in 4-6 weeks

## 2015-03-24 NOTE — Patient Instructions (Signed)
I got nothin for you Awesome 4-6 weeks.

## 2015-03-24 NOTE — Progress Notes (Signed)
Pre visit review using our clinic review tool, if applicable. No additional management support is needed unless otherwise documented below in the visit note. 

## 2015-03-24 NOTE — Progress Notes (Signed)
[name] [address] [place], [place] [address] Phone: [phone] Subjective:     CC: Back and neck pain follow up  [id number] Martha [name] is a 35 y.o. female coming in with complaint of back and upper neck pain. Patient does have a past medical history significant for multiple sclerosis that is well controlled. Had poor posture and responded well to OMT.  Patient gastroesophageal reflux disease is significantly better. Feeling better overall. Patient is not having any significant flare of the multiple sclerosis. Still having some increased stress more secondary to her planning her wedding which is made. Mild exacerbation of the upper back pain but nothing significant.  Past Medical History  Diagnosis Date  . MS (multiple sclerosis) ([place])   . PCOS (polycystic ovarian syndrome)   . Asthma    Past Surgical History  Procedure Laterality Date  . Tonsillectomy  1986   Social History   Social History  . Marital Status: Single    Spouse Name: N/A  . Number of Children: N/A  . Years of Education: N/A   Occupational History  . Not on file.   Social History Main Topics  . Smoking status: Never Smoker   . Smokeless tobacco: Not on file  . Alcohol Use: Not on file  . Drug Use: No  . Sexual Activity: Not on file   Other Topics Concern  . Not on file   Social History Narrative   No Known Allergies No family history on file. positive family history multiple sclerosis      Past medical history, social, surgical and family history all reviewed in electronic medical record.   Review of Systems: No headache, visual changes, nausea, vomiting, diarrhea, constipation, dizziness, abdominal pain, skin rash, fevers, chills, night sweats, weight loss, swollen lymph nodes, body aches, joint swelling, muscle aches, chest pain, shortness of breath, mood changes.   Objective Blood pressure 124/82, pulse 87, SpO2 99 %.  General: No apparent  distress alert and oriented x3 mood and affect normal, dressed appropriately.  HEENT: Pupils equal, extraocular movements intact  Respiratory: Patient's speak in full sentences and does not appear short of breath  Cardiovascular: No lower extremity edema, non tender, no erythema  Skin: Warm dry intact with no signs of infection or rash on extremities or on axial skeleton.  Abdomen: Soft nontender  Neuro: Cranial nerves II through XII are intact, neurovascularly intact in all extremities with 2+ DTRs and 2+ pulses.  Lymph: No lymphadenopathy of posterior or anterior cervical chain or axillae bilaterally.  Gait normal with good balance and coordination.  MSK:  Non tender with full range of motion and good stability and symmetric strength and tone of shoulders, elbows, wrist, hip, knee and ankles bilaterally.  Back Exam:  Inspection: Unremarkable  Motion: Flexion 45 deg, Extension 45 deg, Side Bending to 45 deg bilaterally,  Rotation to 45 deg bilaterally  SLR laying: Negative  XSLR laying: Negative  Palpable tenderness: Continued tenderness in the thoracic spine in the paraspinal muscle which are FABER: negative. Sensory change: Gross sensation intact to all lumbar and sacral dermatomes.  Reflexes: 2+ at both patellar tendons, 2+ at achilles tendons, Babinski's downgoing.  Strength at foot  Plantar-flexion: 5/5 Dorsi-flexion: 5/5 Eversion: 5/5 Inversion: 5/5  Leg strength  Quad: 5/5 Hamstring: 5/5 Hip flexor: 5/5 Hip abductors: 5/5  Gait unremarkable. Neck: Inspection unremarkable. No palpable stepoffs. Negative Spurling's maneuver. Continued improvement in range of motion with near full range of motion Grip strength  and sensation normal in bilateral hands Strength good C4 to T1 distribution No sensory change to C4 to T1 Negative Hoffman sign bilaterally Reflexes normal  OMT Physical Exam   Standing flexion right   Seated Flexion right  Cervical  C2 flexed rotated and side  bent left   Thoracic T3 extended rotated and side bent right T5 extended rotated and side bent left with inhaled fifth rib T7 extended rotated and side bent left  Lumbar L2 flexed rotated and side bent right  Sacrum Left on left    Impression and Recommendations:     This case required medical decision making of moderate complexity.

## 2015-03-25 ENCOUNTER — Ambulatory Visit: Payer: 59 | Admitting: Family Medicine

## 2015-03-28 MED FILL — ZOLPIDEM TARTRATE 10 MG TAB: 10 | 30 days supply | Qty: 30 | Fill #1

## 2015-03-29 MED FILL — DENTA 5000 PLUS CREAM: 1.1 | 30 days supply | Qty: 51 | Fill #0

## 2015-04-11 DIAGNOSIS — L309 Dermatitis, unspecified: Secondary | ICD-10-CM | POA: Diagnosis not present

## 2015-04-11 DIAGNOSIS — L304 Erythema intertrigo: Secondary | ICD-10-CM | POA: Diagnosis not present

## 2015-04-11 MED FILL — HYDROCORTISONE 2.5% LOTION: 2.5 | 14 days supply | Qty: 118 | Fill #0

## 2015-04-11 MED FILL — KETOCONAZOLE 2% CREAM: 2 | 14 days supply | Qty: 60 | Fill #0

## 2015-04-15 MED FILL — SAXENDA 18 MG/3 ML PEN: 18 | 28 days supply | Qty: 15 | Fill #2

## 2015-04-15 MED FILL — PENICILLIN VK 500 MG TABLET: 500 | 10 days supply | Qty: 20 | Fill #0

## 2015-04-26 ENCOUNTER — Encounter: Payer: Self-pay | Admitting: Family Medicine

## 2015-04-26 ENCOUNTER — Ambulatory Visit (INDEPENDENT_AMBULATORY_CARE_PROVIDER_SITE_OTHER): Payer: 59 | Admitting: Family Medicine

## 2015-04-26 VITALS — BP 118/82

## 2015-04-26 DIAGNOSIS — M9903 Segmental and somatic dysfunction of lumbar region: Secondary | ICD-10-CM

## 2015-04-26 DIAGNOSIS — M9902 Segmental and somatic dysfunction of thoracic region: Secondary | ICD-10-CM | POA: Diagnosis not present

## 2015-04-26 DIAGNOSIS — M9908 Segmental and somatic dysfunction of rib cage: Secondary | ICD-10-CM | POA: Diagnosis not present

## 2015-04-26 DIAGNOSIS — M546 Pain in thoracic spine: Secondary | ICD-10-CM

## 2015-04-26 DIAGNOSIS — M999 Biomechanical lesion, unspecified: Secondary | ICD-10-CM

## 2015-04-26 MED ORDER — TIZANIDINE HCL 4 MG PO TABS: 4.0000 mg | ORAL_TABLET | Freq: Every evening | ORAL | Status: DC

## 2015-04-26 MED FILL — tiZANidine HCL 4 MG TABS: 4 | 30 days supply | Qty: 30 | Fill #0

## 2015-04-26 NOTE — Progress Notes (Signed)
[name] [address] [place], [place] [address] Phone: [phone] Subjective:     CC: Back and neck pain follow up  [id number] Martha [name] is a 35 y.o. female coming in with complaint of back and upper neck pain. Patient does have a past medical history significant for multiple sclerosis that is well controlled.  Patient had had some back spasms we'll see the right side recently. Stated that she could not hydroxyzine. Patient states though that seems to be a little more frequent. Denies any radiation down the arms or in the neck itself. Denies any weakness.  Past Medical History  Diagnosis Date  . MS (multiple sclerosis) ([place])   . PCOS (polycystic ovarian syndrome)   . Asthma    Past Surgical History  Procedure Laterality Date  . Tonsillectomy  1986   Social History   Social History  . Marital Status: Single    Spouse Name: N/A  . Number of Children: N/A  . Years of Education: N/A   Occupational History  . Not on file.   Social History Main Topics  . Smoking status: Never Smoker   . Smokeless tobacco: Not on file  . Alcohol Use: Not on file  . Drug Use: No  . Sexual Activity: Not on file   Other Topics Concern  . Not on file   Social History Narrative   No Known Allergies No family history on file. positive family history multiple sclerosis     Past medical history, social, surgical and family history all reviewed in electronic medical record.   Review of Systems: No headache, visual changes, nausea, vomiting, diarrhea, constipation, dizziness, abdominal pain, skin rash, fevers, chills, night sweats, weight loss, swollen lymph nodes, body aches, joint swelling, muscle aches, chest pain, shortness of breath, mood changes.   Objective Blood pressure 118/82.  General: No apparent distress alert and oriented x3 mood and affect normal, dressed appropriately.  HEENT: Pupils equal, extraocular movements intact    Respiratory: Patient's speak in full sentences and does not appear short of breath  Cardiovascular: No lower extremity edema, non tender, no erythema  Skin: Warm dry intact with no signs of infection or rash on extremities or on axial skeleton.  Abdomen: Soft nontender  Neuro: Cranial nerves II through XII are intact, neurovascularly intact in all extremities with 2+ DTRs and 2+ pulses.  Lymph: No lymphadenopathy of posterior or anterior cervical chain or axillae bilaterally.  Gait normal with good balance and coordination.  MSK:  Non tender with full range of motion and good stability and symmetric strength and tone of shoulders, elbows, wrist, hip, knee and ankles bilaterally.  Back Exam:  Inspection: Unremarkable  Motion: Flexion 45 deg, Extension 45 deg, Side Bending to 45 deg bilaterally,  Rotation to 45 deg bilaterally  SLR laying: Negative  XSLR laying: Negative  Palpable tenderness: Continued tenderness in the thoracic spine in the paraspinal muscle which are FABER: negative. Sensory change: Gross sensation intact to all lumbar and sacral dermatomes.  Reflexes: 2+ at both patellar tendons, 2+ at achilles tendons, Babinski's downgoing.  Strength at foot  Plantar-flexion: 5/5 Dorsi-flexion: 5/5 Eversion: 5/5 Inversion: 5/5  Leg strength  Quad: 5/5 Hamstring: 5/5 Hip flexor: 5/5 Hip abductors: 5/5  Gait unremarkable. Neck: Inspection unremarkable. No palpable stepoffs. Negative Spurling's maneuver. Continued improvement in range of motion with near full range of motion Grip strength and sensation normal in bilateral hands Strength good C4 to T1 distribution No sensory change to  C4 to T1 Negative Hoffman sign bilaterally Reflexes normal Has some scapular dysfunctionstill present on the left side  OMT Physical Exam   Cervical  C2 flexed rotated and side bent left  Thoracic T3 extended rotated and side bent right T5 extended rotated and side bent left with inhaled fifth  rib T8 extended rotated inside that right  Lumbar L2 flexed rotated and side bent right L4 flexed rotated and side bent right  Sacrum Left on left    Impression and Recommendations:     This case required medical decision making of moderate complexity.

## 2015-04-26 NOTE — Progress Notes (Signed)
Pre visit review using our clinic review tool, if applicable. No additional management support is needed unless otherwise documented below in the visit note. 

## 2015-04-26 NOTE — Assessment & Plan Note (Signed)
Decision today to treat with OMT was based on Physical Exam  After verbal consent patient was treated with HVLA, ME techniques in rib (ME only), thoracic and lumbar areas  Patient tolerated the procedure well with improvement in symptoms  Patient given exercises, stretches and lifestyle modifications  See medications in patient instructions if given  Patient will follow up in 4-6 weeks

## 2015-04-26 NOTE — Patient Instructions (Signed)
Good to see you  Ice 20 minutes 2 times daily. Usually after activity and before bed. Keep up with the posture See me again in 6 weeks Zanaflex at night

## 2015-04-26 NOTE — Assessment & Plan Note (Signed)
Patient overall seems to be doing better. Encourage her to do more the scapula. Patient given a prescription for muscle relaxer if any more spasms occur. Discussed possibly seen patient is day 6 weeks for further evaluation and manipulation. Patient has had some stressors in her life that likely is contribute in some of the setback she's had recently.

## 2015-04-28 MED FILL — ZOLPIDEM TARTRATE 10 MG TAB: 10 | 30 days supply | Qty: 30 | Fill #2

## 2015-05-20 DIAGNOSIS — G35 Multiple sclerosis: Secondary | ICD-10-CM | POA: Diagnosis not present

## 2015-05-20 DIAGNOSIS — R635 Abnormal weight gain: Secondary | ICD-10-CM | POA: Diagnosis not present

## 2015-05-20 MED FILL — LEVONOR-ETH ESTRAD 0.15-0.0: 0.15-30 | 84 days supply | Qty: 84 | Fill #1

## 2015-05-20 MED FILL — SAXENDA 18 MG/3 ML PEN: 18 | 28 days supply | Qty: 15 | Fill #3

## 2015-05-25 DIAGNOSIS — J00 Acute nasopharyngitis [common cold]: Secondary | ICD-10-CM | POA: Diagnosis not present

## 2015-05-25 DIAGNOSIS — J111 Influenza due to unidentified influenza virus with other respiratory manifestations: Secondary | ICD-10-CM | POA: Diagnosis not present

## 2015-06-02 MED FILL — ONDANSETRON HCL 8 MG TABLET: 8 | 15 days supply | Qty: 45 | Fill #1

## 2015-06-02 MED FILL — ZOLPIDEM TARTRATE 10 MG TAB: 10 | 30 days supply | Qty: 30 | Fill #3

## 2015-06-02 MED FILL — ESOMEPRAZOLE MAG DR 40 MG C: 40 | 90 days supply | Qty: 90 | Fill #1

## 2015-06-07 ENCOUNTER — Encounter: Payer: Self-pay | Admitting: Family Medicine

## 2015-06-07 ENCOUNTER — Ambulatory Visit (INDEPENDENT_AMBULATORY_CARE_PROVIDER_SITE_OTHER): Payer: 59 | Admitting: Family Medicine

## 2015-06-07 VITALS — BP 118/80 | HR 76 | Wt 175.0 lb

## 2015-06-07 DIAGNOSIS — M9903 Segmental and somatic dysfunction of lumbar region: Secondary | ICD-10-CM

## 2015-06-07 DIAGNOSIS — M999 Biomechanical lesion, unspecified: Secondary | ICD-10-CM

## 2015-06-07 DIAGNOSIS — M546 Pain in thoracic spine: Secondary | ICD-10-CM | POA: Diagnosis not present

## 2015-06-07 DIAGNOSIS — M9908 Segmental and somatic dysfunction of rib cage: Secondary | ICD-10-CM

## 2015-06-07 DIAGNOSIS — M9902 Segmental and somatic dysfunction of thoracic region: Secondary | ICD-10-CM

## 2015-06-07 MED ORDER — DICLOFENAC SODIUM 2 % TD SOLN: TRANSDERMAL | Status: DC

## 2015-06-07 NOTE — Assessment & Plan Note (Signed)
Patient overall seems to be doing relatively well. Continues respond very well to osteopathic manipulation. We discussed topical anti-inflammatories which were prescribed for her today. Encourage her to continue the vitamin D supplementation. I do think the patient is doing well. Patient has muscle relaxer for breakthrough pain. Patient come back and see me again in 6-8 weeks for further evaluation and treatment.

## 2015-06-07 NOTE — Assessment & Plan Note (Signed)
Decision today to treat with OMT was based on Physical Exam  After verbal consent patient was treated with HVLA, ME techniques in rib (ME only), thoracic and lumbar areas  Patient tolerated the procedure well with improvement in symptoms  Patient given exercises, stretches and lifestyle modifications  See medications in patient instructions if given  Patient will follow up in 6-8 weeks                    

## 2015-06-07 NOTE — Progress Notes (Signed)
[name] [address] [place], [place] [address] Phone: [phone] Subjective:     CC: Back and neck pain follow up  [id number] Martha [name] is a 35 y.o. female coming in with complaint of back and upper neck pain. Patient does have a past medical history significant for multiple sclerosis that is well controlled.  Patient overall has been doing relatively well. Patient states that she is feeling better. Patient states no significant pain radiating down the arms or legs. Patient states some mild muscle spasm from time to time but muscle relaxer does help.  Past Medical History  Diagnosis Date  . MS (multiple sclerosis) ([place])   . PCOS (polycystic ovarian syndrome)   . Asthma    Past Surgical History  Procedure Laterality Date  . Tonsillectomy  1986   Social History   Social History  . Marital Status: Single    Spouse Name: N/A  . Number of Children: N/A  . Years of Education: N/A   Occupational History  . Not on file.   Social History Main Topics  . Smoking status: Never Smoker   . Smokeless tobacco: Not on file  . Alcohol Use: Not on file  . Drug Use: No  . Sexual Activity: Not on file   Other Topics Concern  . Not on file   Social History Narrative   No Known Allergies No family history on file. positive family history multiple sclerosis     Past medical history, social, surgical and family history all reviewed in electronic medical record.   Review of Systems: No headache, visual changes, nausea, vomiting, diarrhea, constipation, dizziness, abdominal pain, skin [name], fevers, chills, night sweats, weight loss, swollen lymph nodes, body aches, joint swelling, muscle aches, chest pain, shortness of breath, mood changes.   Objective Blood pressure 118/80, pulse 76, weight 175 lb (79.379 kg).  General: No apparent distress alert and oriented x3 mood and affect normal, dressed appropriately.  HEENT: Pupils equal,  extraocular movements intact  Respiratory: Patient's speak in full sentences and does not appear short of breath  Cardiovascular: No lower extremity edema, non tender, no erythema  Skin: Warm dry intact with no signs of infection or [name] on extremities or on axial skeleton.  Abdomen: Soft nontender  Neuro: Cranial nerves II through XII are intact, neurovascularly intact in all extremities with 2+ DTRs and 2+ pulses.  Lymph: No lymphadenopathy of posterior or anterior cervical chain or axillae bilaterally.  Gait normal with good balance and coordination.  MSK:  Non tender with full range of motion and good stability and symmetric strength and tone of shoulders, elbows, wrist, hip, knee and ankles bilaterally.  Back Exam:  Inspection: Unremarkable  Motion: Flexion 45 deg, Extension 25 deg, Side Bending to 45 deg bilaterally,  Rotation to 45 deg bilaterally  SLR laying: Negative  XSLR laying: Negative  Palpable tenderness: Continued tightness and mild discomfort of the paraspinal musculature at the thoracolumbar junction bilaterally FABER: negative. Sensory change: Gross sensation intact to all lumbar and sacral dermatomes.  Reflexes: 2+ at both patellar tendons, 2+ at achilles tendons, Babinski's downgoing.  Strength at foot  Plantar-flexion: 5/5 Dorsi-flexion: 5/5 Eversion: 5/5 Inversion: 5/5  Leg strength  Quad: 5/5 Hamstring: 5/5 Hip flexor: 5/5 Hip abductors: 4/5 mild worsening from previous exam Gait unremarkable. Neck: Inspection unremarkable. No palpable stepoffs. Negative Spurling's maneuver. Full range of motion which is improvement Grip strength and sensation normal in bilateral hands Strength good C4 to T1 distribution  No sensory change to C4 to T1 Negative Hoffman sign bilaterally Reflexes normal Scapular dysfunction some noted left side  OMT Physical Exam   Cervical  C2 flexed rotated and side bent left  Thoracic T3 extended rotated and side bent right T5  extended rotated and side bent left with inhaled fifth rib  Lumbar L2 flexed rotated and side bent right L4 flexed rotated and side bent right  Sacrum Left on left    Impression and Recommendations:     This case required medical decision making of moderate complexity.

## 2015-06-07 NOTE — Patient Instructions (Signed)
Good to see you  Keep doing what you are doing pennsaid pinkie amount topically 2 times daily as needed.  See me either before or after the BIG DAY!

## 2015-06-13 DIAGNOSIS — G35 Multiple sclerosis: Secondary | ICD-10-CM | POA: Diagnosis not present

## 2015-06-24 MED FILL — SAXENDA 18 MG/3 ML PEN: 18 | 30 days supply | Qty: 15 | Fill #0

## 2015-07-02 DIAGNOSIS — M25571 Pain in right ankle and joints of right foot: Secondary | ICD-10-CM | POA: Diagnosis not present

## 2015-07-11 DIAGNOSIS — M79671 Pain in right foot: Secondary | ICD-10-CM | POA: Diagnosis not present

## 2015-07-18 ENCOUNTER — Ambulatory Visit: Payer: 59 | Admitting: Family Medicine

## 2015-07-27 ENCOUNTER — Encounter: Payer: Self-pay | Admitting: Family Medicine

## 2015-07-27 ENCOUNTER — Ambulatory Visit (INDEPENDENT_AMBULATORY_CARE_PROVIDER_SITE_OTHER): Payer: 59 | Admitting: Family Medicine

## 2015-07-27 VITALS — BP 112/80 | HR 64 | Wt 175.0 lb

## 2015-07-27 DIAGNOSIS — M9903 Segmental and somatic dysfunction of lumbar region: Secondary | ICD-10-CM

## 2015-07-27 DIAGNOSIS — M9908 Segmental and somatic dysfunction of rib cage: Secondary | ICD-10-CM

## 2015-07-27 DIAGNOSIS — M9902 Segmental and somatic dysfunction of thoracic region: Secondary | ICD-10-CM

## 2015-07-27 DIAGNOSIS — M546 Pain in thoracic spine: Secondary | ICD-10-CM

## 2015-07-27 DIAGNOSIS — M999 Biomechanical lesion, unspecified: Secondary | ICD-10-CM

## 2015-07-27 NOTE — Progress Notes (Signed)
[name] [address] [place], [place] [address] Phone: [phone] Subjective:     CC: Back and neck pain follow up  [id number] Martha [name] is a 35 y.o. female coming in with complaint of back and upper neck pain. Patient does have a past medical history significant for multiple sclerosis that is well controlled. Patient states that overall patient is doing well. Patient did get married and states that that relieves some of the stress. Continues to stay fairly active. Patient states still some tightness of the upper back.Nothing that is severe that stops her from activity.  Past Medical History  Diagnosis Date  . MS (multiple sclerosis) ([place])   . PCOS (polycystic ovarian syndrome)   . Asthma    Past Surgical History  Procedure Laterality Date  . Tonsillectomy  1986   Social History   Social History  . Marital Status: Single    Spouse Name: N/A  . Number of Children: N/A  . Years of Education: N/A   Occupational History  . Not on file.   Social History Main Topics  . Smoking status: Never Smoker   . Smokeless tobacco: Not on file  . Alcohol Use: Not on file  . Drug Use: No  . Sexual Activity: Not on file   Other Topics Concern  . Not on file   Social History Narrative   No Known Allergies No family history on file. positive family history multiple sclerosis     Past medical history, social, surgical and family history all reviewed in electronic medical record.   Review of Systems: No headache, visual changes, nausea, vomiting, diarrhea, constipation, dizziness, abdominal pain, skin rash, fevers, chills, night sweats, weight loss, swollen lymph nodes, body aches, joint swelling, muscle aches, chest pain, shortness of breath, mood changes.   Objective Blood pressure 112/80, pulse 64, weight 175 lb (79.379 kg).  General: No apparent distress alert and oriented x3 mood and affect normal, dressed appropriately.  HEENT:  Pupils equal, extraocular movements intact  Respiratory: Patient's speak in full sentences and does not appear short of breath  Cardiovascular: No lower extremity edema, non tender, no erythema  Skin: Warm dry intact with no signs of infection or rash on extremities or on axial skeleton.  Abdomen: Soft nontender  Neuro: Cranial nerves II through XII are intact, neurovascularly intact in all extremities with 2+ DTRs and 2+ pulses.  Lymph: No lymphadenopathy of posterior or anterior cervical chain or axillae bilaterally.  Gait normal with good balance and coordination.  MSK:  Non tender with full range of motion and good stability and symmetric strength and tone of shoulders, elbows, wrist, hip, knee and ankles bilaterally.  Back Exam:  Inspection: Unremarkable  Motion: Flexion 45 deg, Extension 25 deg, Side Bending to 35 deg bilaterally,  Rotation to 45 deg bilaterally  SLR laying: Negative  XSLR laying: Negative  Palpable tenderness: Less tenderness than before but still has pain mostly being tackled lumbar junction FABER: negative. Sensory change: Gross sensation intact to all lumbar and sacral dermatomes.  Reflexes: 2+ at both patellar tendons, 2+ at achilles tendons, Babinski's downgoing.  Strength at foot  Plantar-flexion: 5/5 Dorsi-flexion: 5/5 Eversion: 5/5 Inversion: 5/5  Leg strength  Quad: 5/5 Hamstring: 5/5 Hip flexor: 5/5 Hip abductors: 4+/5 mild worsening from previous exam Gait unremarkable. Neck: Inspection unremarkable. No palpable stepoffs. Negative Spurling's maneuver. Full range of motion which is improvement Grip strength and sensation normal in bilateral hands Strength good C4 to T1  distribution No sensory change to C4 to T1 Negative Hoffman sign bilaterally Reflexes normal Mild improvement with the scapular dysfunction  OMT Physical Exam   Cervical  C2 flexed rotated and side bent left  Thoracic T3 extended rotated and side bent right T7 extended  rotated and side bent left with inhaled fifth rib  Lumbar L2 flexed rotated and side bent right L4 flexed rotated and side bent right  Sacrum Left on left    Impression and Recommendations:     This case required medical decision making of moderate complexity.

## 2015-07-27 NOTE — Progress Notes (Signed)
Pre visit review using our clinic review tool, if applicable. No additional management support is needed unless otherwise documented below in the visit note. 

## 2015-07-27 NOTE — Assessment & Plan Note (Signed)
Decision today to treat with OMT was based on Physical Exam  After verbal consent patient was treated with HVLA, ME techniques in rib (ME only), thoracic and lumbar areas  Patient tolerated the procedure well with improvement in symptoms  Patient given exercises, stretches and lifestyle modifications  See medications in patient instructions if given  Patient will follow up in 4 weeks

## 2015-07-27 NOTE — Assessment & Plan Note (Signed)
Overall doing fairly well. Encourage patient to do more and core strengthening. Patient has not been doing well and tingling loss of the moment. Encourage her to try to stand more regimen. Patient is building a house which is going to give her more stress. Patient will come back and see me again in 3-4 weeks for further evaluation and treatment.

## 2015-08-09 MED FILL — SAXENDA 18 MG/3 ML PEN: 18 | 30 days supply | Qty: 15 | Fill #1

## 2015-08-24 MED FILL — ZOLPIDEM TARTRATE 10 MG TAB: 10 | 30 days supply | Qty: 30 | Fill #0

## 2015-09-01 DIAGNOSIS — G35 Multiple sclerosis: Secondary | ICD-10-CM | POA: Diagnosis not present

## 2015-09-01 MED FILL — MARLISSA-28 TABLET: 0.15-30 | 84 days supply | Qty: 84 | Fill #2

## 2015-09-07 ENCOUNTER — Ambulatory Visit (INDEPENDENT_AMBULATORY_CARE_PROVIDER_SITE_OTHER): Payer: 59 | Admitting: Family Medicine

## 2015-09-07 ENCOUNTER — Encounter: Payer: Self-pay | Admitting: Family Medicine

## 2015-09-07 VITALS — BP 112/80

## 2015-09-07 DIAGNOSIS — M546 Pain in thoracic spine: Secondary | ICD-10-CM | POA: Diagnosis not present

## 2015-09-07 DIAGNOSIS — M9903 Segmental and somatic dysfunction of lumbar region: Secondary | ICD-10-CM

## 2015-09-07 DIAGNOSIS — M9902 Segmental and somatic dysfunction of thoracic region: Secondary | ICD-10-CM

## 2015-09-07 DIAGNOSIS — M9908 Segmental and somatic dysfunction of rib cage: Secondary | ICD-10-CM

## 2015-09-07 DIAGNOSIS — M999 Biomechanical lesion, unspecified: Secondary | ICD-10-CM

## 2015-09-07 NOTE — Progress Notes (Signed)
Pre visit review using our clinic review tool, if applicable. No additional management support is needed unless otherwise documented below in the visit note. 

## 2015-09-07 NOTE — Patient Instructions (Signed)
Good to see you  Thanks for the pics See me again in 6-8 weeks

## 2015-09-07 NOTE — Assessment & Plan Note (Signed)
Decision today to treat with OMT was based on Physical Exam  After verbal consent patient was treated with HVLA, ME techniques in rib (ME only), thoracic and lumbar areas  Patient tolerated the procedure well with improvement in symptoms  Patient given exercises, stretches and lifestyle modifications  See medications in patient instructions if given  Patient will follow up in 6-12 weeks

## 2015-09-07 NOTE — Progress Notes (Signed)
[name] [place], [place] [address] Phone: [phone] Subjective:     CC: Back and neck pain follow up  [id number] Martha [name] is a 35 y.o. female coming in with complaint of back and upper neck pain. Patient does have a past medical history significant for multiple sclerosis that is well controlled. Since patient has been married she has been less stress. Patient is in the process of building a house. Patient states though that she is doing relatively well. Went on a long vacation which was beneficial.   Past Medical History  Diagnosis Date  . MS (multiple sclerosis) ([place])   . PCOS (polycystic ovarian syndrome)   . Asthma    Past Surgical History  Procedure Laterality Date  . Tonsillectomy  1986   Social History   Social History  . Marital Status: Single    Spouse Name: N/A  . Number of Children: N/A  . Years of Education: N/A   Occupational History  . Not on file.   Social History Main Topics  . Smoking status: Never Smoker   . Smokeless tobacco: Not on file  . Alcohol Use: Not on file  . Drug Use: No  . Sexual Activity: Not on file   Other Topics Concern  . Not on file   Social History Narrative   No Known Allergies No family history on file. positive family history multiple sclerosis     Past medical history, social, surgical and family history all reviewed in electronic medical record.   Review of Systems: No headache, visual changes, nausea, vomiting, diarrhea, constipation, dizziness, abdominal pain, skin rash, fevers, chills, night sweats, weight loss, swollen lymph nodes, body aches, joint swelling, muscle aches, chest pain, shortness of breath, mood changes.   Objective Blood pressure 112/80.  General: No apparent distress alert and oriented x3 mood and affect normal, dressed appropriately.  HEENT: Pupils equal, extraocular movements intact  Respiratory: Patient's speak in full sentences and  does not appear short of breath  Cardiovascular: No lower extremity edema, non tender, no erythema  Skin: Warm dry intact with no signs of infection or rash on extremities or on axial skeleton.  Abdomen: Soft nontender  Neuro: Cranial nerves II through XII are intact, neurovascularly intact in all extremities with 2+ DTRs and 2+ pulses.  Lymph: No lymphadenopathy of posterior or anterior cervical chain or axillae bilaterally.  Gait normal with good balance and coordination.  MSK:  Non tender with full range of motion and good stability and symmetric strength and tone of shoulders, elbows, wrist, hip, knee and ankles bilaterally.  Back Exam:  Inspection: Unremarkable  Motion: Flexion 45 deg, Extension 25 deg, Side Bending to 35 deg bilaterally,  Rotation to 45 deg bilaterally  SLR laying: Negative  XSLR laying: Negative  Palpable tenderness: Less tenderness than before but still has pain mostly being tackled lumbar junction FABER: negative. Sensory change: Gross sensation intact to all lumbar and sacral dermatomes.  Reflexes: 2+ at both patellar tendons, 2+ at achilles tendons, Babinski's downgoing.  Strength at foot  Plantar-flexion: 5/5 Dorsi-flexion: 5/5 Eversion: 5/5 Inversion: 5/5  Leg strength  Quad: 5/5 Hamstring: 5/5 Hip flexor: 5/5 Hip abductors: 4+/5 mild worsening from previous exam Gait unremarkable. Neck: Inspection unremarkable. No palpable stepoffs. Negative Spurling's maneuver. Full range of motion which is improvement Grip strength and sensation normal in bilateral hands Strength good C4 to T1 distribution No sensory change to C4 to T1 Negative Hoffman sign bilaterally Reflexes  normal Mild improvement with the scapular dysfunction  OMT Physical Exam   Cervical  C2 flexed rotated and side bent left  Thoracic T3 extended rotated and side bent right T5 extended rotated and side bent left with inhaled fifth rib  Lumbar L2 flexed rotated and side bent  right L5 flexed rotated and side bent right  Sacrum Left on left    Impression and Recommendations:     This case required medical decision making of moderate complexity.

## 2015-09-07 NOTE — Assessment & Plan Note (Signed)
Overall doing relatively well. We discussed icing regimen. We discussed home exercises. We discussed which activities to do in which ones to avoid. Discussed with patient about ergonomics at work. Follow-up again in 6-12 weeks

## 2015-09-20 MED FILL — ESOMEPRAZOLE MAG DR 40 MG C: 40 | 90 days supply | Qty: 90 | Fill #2

## 2015-09-20 MED FILL — tiZANidine HCL 4 MG TABS: 4 | 30 days supply | Qty: 30 | Fill #1

## 2015-09-22 MED FILL — ZOLPIDEM TARTRATE 10 MG TAB: 10 | 30 days supply | Qty: 30 | Fill #1

## 2015-09-23 MED FILL — SAXENDA 18 MG/3 ML PEN: 18 | 30 days supply | Qty: 15 | Fill #2

## 2015-09-26 ENCOUNTER — Encounter: Payer: Self-pay | Admitting: Gastroenterology

## 2015-09-26 ENCOUNTER — Ambulatory Visit (INDEPENDENT_AMBULATORY_CARE_PROVIDER_SITE_OTHER): Payer: 59 | Admitting: Gastroenterology

## 2015-09-26 VITALS — BP 118/88 | HR 80 | Ht 64.57 in | Wt 174.0 lb

## 2015-09-26 DIAGNOSIS — K589 Irritable bowel syndrome without diarrhea: Secondary | ICD-10-CM

## 2015-09-26 DIAGNOSIS — R7989 Other specified abnormal findings of blood chemistry: Secondary | ICD-10-CM | POA: Diagnosis not present

## 2015-09-26 DIAGNOSIS — R197 Diarrhea, unspecified: Secondary | ICD-10-CM

## 2015-09-26 DIAGNOSIS — R945 Abnormal results of liver function studies: Secondary | ICD-10-CM

## 2015-09-26 NOTE — Patient Instructions (Signed)
Go to the basement for labs today Use VSL #3 112 B units 1 capsule daily  We have faxed your records release for your records to [facility]

## 2015-09-27 NOTE — Progress Notes (Signed)
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 She was noted to have AST 44 and ALT 129 on 07/05/2015, on repeat check 08/31/13 AST 21 and ALT 31. She discontinued Tecfidera in the interim. She was prescribed Zanaflex but she took only a few doses prior to her LFT abnormality in May and hasn't taken any since then . She has history of multiple sclerosis , currently in remission. She has had elevated transaminases intermittently in the past but never over 100-150 range. 5 years ago she had EGD and colonoscopy for evaluation of diarrhea, bloating and abdominal cramps . Biopsies and workup were negative for IBD. She was diagnosed with irritable bowel syndrome , she started taking cholestyramine with significant improvement of diarrhea . She was episode of bright red blood per rectum with excessive straining after she took cholestyramine and and L[place]otil around the time of her wedding to prevent any episodes of diarrhea and noticed a prolapsed hemorrhoid that has since resolved but complains of burning sensation posterior to the anal verge and thinks may be eczema. She has lost 20-25 pounds in the past few months for her wedding , feeling well overall . She is on Nexium as needed for GERD symptoms could control.Denies any nausea, vomiting, abdominal pain, melena or bright red blood per rectum    Outpatient Encounter Prescriptions as of 09/26/2015  Medication Sig  . Diclofenac Sodium 2 % SOLN Apply 1 pump twice daily.  . Dimethyl Fumarate (TECFIDERA) 240 MG CPDR Take 2 capsules by mouth daily.  Marland Kitchen esomeprazole (NEXIUM) 40 MG capsule Take 1 capsule (40 mg total) by mouth daily.  . hydrOXYzine (ATARAX/VISTARIL) 25 MG tablet Take 1 tablet (25 mg total) by mouth 3 (three) times daily as needed.  .  norgestimate-ethinyl estradiol (ORTHO-CYCLEN,SPRINTEC,PREVIFEM) 0.25-35 MG-MCG tablet Take 1 tablet by mouth daily.  Marland Kitchen tiZANidine (ZANAFLEX) 4 MG tablet Take 1 tablet (4 mg total) by mouth Nightly.  . Vitamin D, Ergocalciferol, (DRISDOL) 50000 UNITS CAPS capsule Take 50,000 Units by mouth every 7 (seven) days. Every sunday   No facility-administered encounter medications on file as of 09/26/2015.     Allergies as of 09/26/2015  . (No Known Allergies)    Past Medical History:  Diagnosis Date  . Asthma   . MS (multiple sclerosis) ([place])   . PCOS (polycystic ovarian syndrome)     Past Surgical History:  Procedure Laterality Date  . TONSILECTOMY, ADENOIDECTOMY, BILATERAL MYRINGOTOMY AND TUBES  1986    Family History  Problem Relation Age of Onset  . Diabetes Mother   . Heart disease Mother   . Thyroid disease Mother   . Diabetes Father   . Thyroid disease Father   . Hyperlipidemia Father   . CAD Father   . Breast cancer Paternal Aunt     Social History   Social History  . Marital status: Married    Spouse name: N/A  . Number of children: 0  . Years of education: N/A   Occupational History  . physicain    Social History Main Topics  . Smoking status: Never Smoker  . Smokeless tobacco: Never Used  . Alcohol use No  . Drug use: No  . Sexual activity: Not on file   Other Topics Concern  . Not on  file   Social History Narrative  . No narrative on file      Review of systems: Review of Systems  Constitutional: Negative for fever and chills.  HENT: Negative.   Eyes: Negative for blurred vision.  Respiratory: Negative for cough, shortness of breath and wheezing.   Cardiovascular: Negative for chest pain and palpitations.  Gastrointestinal: as per HPI Genitourinary: Negative for dysuria, urgency, frequency and hematuria.  Musculoskeletal: Negative for myalgias, back pain and joint pain.  Skin: Negative for itching and rash.  Neurological: Negative for  dizziness, tremors, focal weakness, seizures and loss of consciousness.  Endo/Heme/Allergies: Negative for environmental allergies.  Psychiatric/Behavioral: Negative for depression, suicidal ideas and hallucinations.  All other systems reviewed and are negative.   Physical Exam: Vitals:   09/26/15 1323  BP: 118/88  Pulse: 80   Gen:      No acute distress HEENT:  EOMI, sclera anicteric Neck:     No masses; no thyromegaly Lungs:    Clear to auscultation bilaterally; normal respiratory effort CV:         Regular rate and rhythm; no murmurs Abd:      + bowel sounds; soft, non-tender; no palpable masses, no distension Ext:    No edema; adequate peripheral perfusion Skin:      Warm and dry; no rash Neuro: alert and oriented x 3 Psych: normal mood and affect Rectal exam: Normal anal sphincter tone, no anal fissure or external hemorrhoids. Small linear skin breakdown ~1-2 mm posterior to anal verge with surrounding pigmentation skin, no nodules or blisters.  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 She has discontinued nsaids, lost some weight intentionally and also discontinued Ticfedera. Differential includes drug-induced liver injury, nonalcoholic steatohepatitis, autoimmune We will recheck LFTs and also check autoimmune panel, IgG A and TTG antibody Will try to obtain records of previous GI workup to review If continues to have elevated transaminases, we will request abdominal ultrasound to evaluate liver Diarrhea: Improved with cholestyramine could be bile salt diarrhea, continue cholestyramine as needed IBS: We will do a trial of probiotic VSL #3 112 B units 1 capsule daily  Advised to use barrier skin cream, destin to help heal the skin breakdown near the anal verge and avoid excessive  cleaning or moisture Return as needed   Greater than 50% of the time used for counseling as well as treatment plan and follow-up. She had multiple questions which were answered to her satisfaction   K. Denzil Magnuson , MD [phone] Mon-Fri 8a-5p [phone] after 5p, weekends, holidays  CC: No ref. provider found

## 2015-09-28 MED FILL — PREVIDENT 5000 BOOSTER PLUS: 1.1 | 30 days supply | Qty: 100 | Fill #0

## 2015-10-11 DIAGNOSIS — S93402A Sprain of unspecified ligament of left ankle, initial encounter: Secondary | ICD-10-CM | POA: Diagnosis not present

## 2015-10-21 ENCOUNTER — Other Ambulatory Visit (INDEPENDENT_AMBULATORY_CARE_PROVIDER_SITE_OTHER): Payer: 59

## 2015-10-21 DIAGNOSIS — R945 Abnormal results of liver function studies: Principal | ICD-10-CM

## 2015-10-21 DIAGNOSIS — R7989 Other specified abnormal findings of blood chemistry: Secondary | ICD-10-CM | POA: Diagnosis not present

## 2015-10-21 LAB — HEPATIC FUNCTION PANEL
ALT: 20 U/L (ref 0–35)
AST: 17 U/L (ref 0–37)
Albumin: 4.3 g/dL (ref 3.5–5.2)
Alkaline Phosphatase: 47 U/L (ref 39–117)
Bilirubin, Direct: 0.1 mg/dL (ref 0.0–0.3)
TOTAL PROTEIN: 7.4 g/dL (ref 6.0–8.3)
Total Bilirubin: 0.3 mg/dL (ref 0.2–1.2)

## 2015-10-21 LAB — IGA: IgA: 200 mg/dL (ref 68–378)

## 2015-10-24 LAB — ANTI-NUCLEAR AB-TITER (ANA TITER)

## 2015-10-24 LAB — ALPHA-1-ANTITRYPSIN: A1 ANTITRYPSIN SER: 214 mg/dL — AB (ref 83–199)

## 2015-10-24 LAB — ANA: ANA: POSITIVE — AB

## 2015-10-24 LAB — MITOCHONDRIAL ANTIBODIES

## 2015-10-24 LAB — ANTI-SMOOTH MUSCLE ANTIBODY, IGG: Smooth Muscle Ab: <20 U (ref <20)

## 2015-10-24 LAB — TISSUE TRANSGLUTAMINASE, IGA: TISSUE TRANSGLUTAMINASE AB, IGA: 1 U/mL (ref <4)

## 2015-11-01 NOTE — Progress Notes (Signed)
[name] [address], [place] [address] Phone: [phone] Subjective:     CC: Back and neck pain follow up  [id number]  Martha [name] is a 35 y.o. female coming in with complaint of back and upper neck pain. Patient does have a past medical history significant for multiple sclerosis that is well controlled.  Patient is getting ready to move houses. Some mild increase in stress. Discuss some mild discomfort. Also had an ankle injury which she was in a Dispensing optician for 3 weeks. Making progress but unfortunately having more back pain secondary to wearing the boot.  Past Medical History:  Diagnosis Date  . Asthma   . MS (multiple sclerosis) ([place])   . PCOS (polycystic ovarian syndrome)    Past Surgical History:  Procedure Laterality Date  . TONSILECTOMY, ADENOIDECTOMY, BILATERAL MYRINGOTOMY AND TUBES  1986   Social History   Social History  . Marital status: Married    Spouse name: N/A  . Number of children: 0  . Years of education: N/A   Occupational History  . physicain    Social History Main Topics  . Smoking status: Never Smoker  . Smokeless tobacco: Never Used  . Alcohol use No  . Drug use: No  . Sexual activity: Not on file   Other Topics Concern  . Not on file   Social History Narrative  . No narrative on file   No Known Allergies Family History  Problem Relation Age of Onset  . Diabetes Mother   . Heart disease Mother   . Thyroid disease Mother   . Diabetes Father   . Thyroid disease Father   . Hyperlipidemia Father   . CAD Father   . Breast cancer Paternal Aunt    positive family history multiple sclerosis     Past medical history, social, surgical and family history all reviewed in electronic medical record.   Review of Systems: No headache, visual changes, nausea, vomiting, diarrhea, constipation, dizziness, abdominal pain, skin rash, fevers, chills, night sweats, weight loss, swollen lymph nodes,  body aches, joint swelling, muscle aches, chest pain, shortness of breath, mood changes.   Objective  Blood pressure 114/76, pulse 87, SpO2 98 %.  General: No apparent distress alert and oriented x3 mood and affect normal, dressed appropriately.  HEENT: Pupils equal, extraocular movements intact  Respiratory: Patient's speak in full sentences and does not appear short of breath  Cardiovascular: No lower extremity edema, non tender, no erythema  Skin: Warm dry intact with no signs of infection or rash on extremities or on axial skeleton.  Abdomen: Soft nontender  Neuro: Cranial nerves II through XII are intact, neurovascularly intact in all extremities with 2+ DTRs and 2+ pulses.  Lymph: No lymphadenopathy of posterior or anterior cervical chain or axillae bilaterally.  Gait normal with good balance and coordination.  MSK:  Non tender with full range of motion and good stability and symmetric strength and tone of shoulders, elbows, wrist, hip, knee and ankles bilaterally.  Back Exam:  Inspection: Unremarkable  Motion: Flexion 45 deg, Extension 25 deg, Side Bending to 35 deg bilaterally,  Rotation to 45 deg bilaterally  SLR laying: Negative  XSLR laying: Negative  Palpable tenderness: Continued tenderness in the paraspinal musculature of the lumbar spine FABER: negative. Sensory change: Gross sensation intact to all lumbar and sacral dermatomes.  Reflexes: 2+ at both patellar tendons, 2+ at achilles tendons, Babinski's downgoing.  Strength at foot  Plantar-flexion: 5/5 Dorsi-flexion:  5/5 Eversion: 5/5 Inversion: 5/5  Leg strength  Quad: 5/5 Hamstring: 5/5 Hip flexor: 5/5 Hip abductors: 4+/5 mild worsening from previous exam Gait unremarkable. Neck: Inspection unremarkable. No palpable stepoffs. Negative Spurling's maneuver. Mild tightness today which is a little worse than previous exam Grip strength and sensation normal in bilateral hands Strength good C4 to T1 distribution No  sensory change to C4 to T1 Negative Hoffman sign bilaterally Reflexes normal Mild improvement with the scapular dysfunction  OMT Physical Exam  Cervical  C2 flexed rotated and side bent left  Thoracic T3 extended rotated and side bent right T7 extended rotated and side bent left with inhaled fifth rib  Lumbar L2 flexed rotated and side bent right L4 flexed rotated and side bent right  Sacrum Left on left    Impression and Recommendations:     This case required medical decision making of moderate complexity.

## 2015-11-01 NOTE — Assessment & Plan Note (Signed)
Decision today to treat with OMT was based on Physical Exam  After verbal consent patient was treated with HVLA, ME techniques in rib (ME only), thoracic and lumbar areas  Patient tolerated the procedure well with improvement in symptoms  Patient given exercises, stretches and lifestyle modifications  See medications in patient instructions if given  Patient will follow up in 6-12 weeks

## 2015-11-01 NOTE — Assessment & Plan Note (Signed)
Patient is been doing relatively well. Encourage her to continue to work on posture and ergonomics and scapular stabilization. No significant changes in management at this time. His lungs we sent making progress with the posture of expectant anemia less.

## 2015-11-02 ENCOUNTER — Ambulatory Visit (INDEPENDENT_AMBULATORY_CARE_PROVIDER_SITE_OTHER): Payer: 59 | Admitting: Family Medicine

## 2015-11-02 ENCOUNTER — Encounter: Payer: Self-pay | Admitting: Family Medicine

## 2015-11-02 ENCOUNTER — Other Ambulatory Visit: Payer: Self-pay | Admitting: Family Medicine

## 2015-11-02 VITALS — BP 114/76 | HR 87

## 2015-11-02 DIAGNOSIS — M999 Biomechanical lesion, unspecified: Secondary | ICD-10-CM

## 2015-11-02 DIAGNOSIS — M9908 Segmental and somatic dysfunction of rib cage: Secondary | ICD-10-CM | POA: Diagnosis not present

## 2015-11-02 DIAGNOSIS — M546 Pain in thoracic spine: Secondary | ICD-10-CM

## 2015-11-02 DIAGNOSIS — M9902 Segmental and somatic dysfunction of thoracic region: Secondary | ICD-10-CM | POA: Diagnosis not present

## 2015-11-02 DIAGNOSIS — M9903 Segmental and somatic dysfunction of lumbar region: Secondary | ICD-10-CM | POA: Diagnosis not present

## 2015-11-02 MED FILL — ZOLPIDEM TARTRATE 10 MG TAB: 10 | 30 days supply | Qty: 30 | Fill #2

## 2015-11-02 NOTE — Patient Instructions (Signed)
Good to see you as always.  Good luck with the mattress Ankle exercises 3 times a week.  See me again during the move.

## 2015-11-03 NOTE — Telephone Encounter (Signed)
Refill done.  

## 2015-11-25 MED FILL — SAXENDA 18 MG/3 ML PEN: 18 | 30 days supply | Qty: 15 | Fill #3

## 2015-11-29 NOTE — Progress Notes (Signed)
[name] [address], [place] [address] Phone: [phone] Subjective:     CC: Back and neck pain follow up  [id number]  [place] Martha [name] is a 35 y.o. female coming in with complaint of back and upper neck pain. Patient does have a past medical history significant for multiple sclerosis that is well controlled.  Patient is getting ready to move houses. Some mild increase in stress. Discuss some mild discomfort. Patient states though that seems to be doing well overall. No increasing pain anywhere. Continues to be very active. Working a significant amount more.  Past Medical History:  Diagnosis Date  . Asthma   . MS (multiple sclerosis) ([place])   . PCOS (polycystic ovarian syndrome)    Past Surgical History:  Procedure Laterality Date  . TONSILECTOMY, ADENOIDECTOMY, BILATERAL MYRINGOTOMY AND TUBES  1986   Social History   Social History  . Marital status: Married    Spouse name: N/A  . Number of children: 0  . Years of education: N/A   Occupational History  . physicain    Social History Main Topics  . Smoking status: Never Smoker  . Smokeless tobacco: Never Used  . Alcohol use No  . Drug use: No  . Sexual activity: Not on file   Other Topics Concern  . Not on file   Social History Narrative  . No narrative on file   No Known Allergies Family History  Problem Relation Age of Onset  . Diabetes Mother   . Heart disease Mother   . Thyroid disease Mother   . Diabetes Father   . Thyroid disease Father   . Hyperlipidemia Father   . CAD Father   . Breast cancer Paternal Aunt    positive family history multiple sclerosis     Past medical history, social, surgical and family history all reviewed in electronic medical record.   Review of Systems: No headache, visual changes, nausea, vomiting, diarrhea, constipation, dizziness, abdominal pain, skin rash, fevers, chills, night sweats, weight loss, swollen lymph nodes, body  aches, joint swelling, muscle aches, chest pain, shortness of breath, mood changes.   Objective  Blood pressure 122/78.  General: No apparent distress alert and oriented x3 mood and affect normal, dressed appropriately.  HEENT: Pupils equal, extraocular movements intact  Respiratory: Patient's speak in full sentences and does not appear short of breath  Cardiovascular: No lower extremity edema, non tender, no erythema  Skin: Warm dry intact with no signs of infection or rash on extremities or on axial skeleton.  Abdomen: Soft nontender  Neuro: Cranial nerves II through XII are intact, neurovascularly intact in all extremities with 2+ DTRs and 2+ pulses.  Lymph: No lymphadenopathy of posterior or anterior cervical chain or axillae bilaterally.  Gait normal with good balance and coordination.  MSK:  Non tender with full range of motion and good stability and symmetric strength and tone of shoulders, elbows, wrist, hip, knee and ankles bilaterally.  Back Exam:  Inspection: Unremarkable  Motion: Flexion 45 deg, Extension 25 deg, Side Bending to 35 deg bilaterally,  Rotation to 45 deg bilaterally  SLR laying: Negative  XSLR laying: Negative  Palpable tenderness: Continued tenderness in the paraspinal musculature of the lumbar spine FABER: negative. Sensory change: Gross sensation intact to all lumbar and sacral dermatomes.  Reflexes: 2+ at both patellar tendons, 2+ at achilles tendons, Babinski's downgoing.  Strength at foot  Plantar-flexion: 5/5 Dorsi-flexion: 5/5 Eversion: 5/5 Inversion: 5/5  Leg strength  Quad: 5/5 Hamstring: 5/5 Hip flexor: 5/5 Hip abductors: 4+/5 mild worsening from previous exam Gait unremarkable. Neck: Inspection unremarkable. No palpable stepoffs. Negative Spurling's maneuver. Mild tightness today which is a little worse than previous exam Grip strength and sensation normal in bilateral hands Strength good C4 to T1 distribution No sensory change to C4 to  T1 Negative Hoffman sign bilaterally Reflexes normal Continued scapular dysfunction noted  OMT Physical Exam  Cervical  C2 flexed rotated and side bent left C4 flexed rotated and side bent right  Thoracic T3 extended rotated and side bent right T8 extended rotated and side bent left with inhaled fifth rib  Lumbar L2 flexed rotated and side bent right L4 flexed rotated and side bent right  Sacrum Left on left    Impression and Recommendations:     This case required medical decision making of moderate complexity.

## 2015-11-30 ENCOUNTER — Encounter: Payer: Self-pay | Admitting: Family Medicine

## 2015-11-30 ENCOUNTER — Ambulatory Visit (INDEPENDENT_AMBULATORY_CARE_PROVIDER_SITE_OTHER): Payer: 59 | Admitting: Family Medicine

## 2015-11-30 VITALS — BP 122/78

## 2015-11-30 DIAGNOSIS — M999 Biomechanical lesion, unspecified: Secondary | ICD-10-CM

## 2015-11-30 DIAGNOSIS — M546 Pain in thoracic spine: Secondary | ICD-10-CM | POA: Diagnosis not present

## 2015-11-30 DIAGNOSIS — M9908 Segmental and somatic dysfunction of rib cage: Secondary | ICD-10-CM | POA: Diagnosis not present

## 2015-11-30 DIAGNOSIS — M9903 Segmental and somatic dysfunction of lumbar region: Secondary | ICD-10-CM

## 2015-11-30 DIAGNOSIS — M9902 Segmental and somatic dysfunction of thoracic region: Secondary | ICD-10-CM | POA: Diagnosis not present

## 2015-11-30 MED FILL — ZOLPIDEM TARTRATE 10 MG TAB: 10 | 30 days supply | Qty: 30 | Fill #3

## 2015-11-30 NOTE — Patient Instructions (Signed)
Good to see you  Ice when you need it See me again in 4-8 weeks.

## 2015-11-30 NOTE — Assessment & Plan Note (Signed)
Decision today to treat with OMT was based on Physical Exam  After verbal consent patient was treated with HVLA, ME techniques in rib (ME only), thoracic and lumbar areas  Patient tolerated the procedure well with improvement in symptoms  Patient given exercises, stretches and lifestyle modifications  See medications in patient instructions if given  Patient will follow up in 4-8 weeks

## 2015-11-30 NOTE — Assessment & Plan Note (Signed)
Overall patient has been doing relatively well. Still having some mild scapular dysfunction. We discussed the importance of core strengthening and posture. Patient will continue to stay active. Patient will follow-up and see me again in 4-8 weeks.

## 2015-12-01 DIAGNOSIS — G35 Multiple sclerosis: Secondary | ICD-10-CM | POA: Diagnosis not present

## 2015-12-22 DIAGNOSIS — M79675 Pain in left toe(s): Secondary | ICD-10-CM | POA: Diagnosis not present

## 2015-12-28 DIAGNOSIS — H52223 Regular astigmatism, bilateral: Secondary | ICD-10-CM | POA: Diagnosis not present

## 2015-12-28 DIAGNOSIS — H468 Other optic neuritis: Secondary | ICD-10-CM | POA: Diagnosis not present

## 2016-01-02 MED FILL — ZOLPIDEM TARTRATE 10 MG TAB: 10 | 30 days supply | Qty: 30 | Fill #4

## 2016-01-02 MED FILL — ESOMEPRAZOLE MAG DR 40 MG C: 40 | 90 days supply | Qty: 90 | Fill #3

## 2016-01-02 MED FILL — PROMETHAZINE 25 MG TABLET: 25 | 8 days supply | Qty: 20 | Fill #1

## 2016-01-02 MED FILL — tiZANidine HCL 4 MG TABS: 4 | 30 days supply | Qty: 30 | Fill #2

## 2016-01-02 MED FILL — ONDANSETRON HCL 8 MG TABLET: 8 | 15 days supply | Qty: 45 | Fill #2

## 2016-01-11 ENCOUNTER — Ambulatory Visit: Payer: 59 | Admitting: Family Medicine

## 2016-01-11 NOTE — Progress Notes (Signed)
[name] [address] [place], [place] [address] Phone: [phone] Subjective:     CC: Back and neck pain follow up  [id number]  Martha [name] is a 35 y.o. female coming in with complaint of back and upper neck pain. Patient does have a past medical history significant for multiple sclerosis that is well controlled. Patient states some mild tightness but nothing severe. Continues to do the exercises intermittently. Has had some mild increase stress at work but nothing severe.   Past Medical History:  Diagnosis Date  . Asthma   . MS (multiple sclerosis) ([place])   . PCOS (polycystic ovarian syndrome)    Past Surgical History:  Procedure Laterality Date  . TONSILECTOMY, ADENOIDECTOMY, BILATERAL MYRINGOTOMY AND TUBES  1986   Social History   Social History  . Marital status: Married    Spouse name: N/A  . Number of children: 0  . Years of education: N/A   Occupational History  . physicain    Social History Main Topics  . Smoking status: Never Smoker  . Smokeless tobacco: Never Used  . Alcohol use No  . Drug use: No  . Sexual activity: Not on file   Other Topics Concern  . Not on file   Social History Narrative  . No narrative on file   No Known Allergies Family History  Problem Relation Age of Onset  . Diabetes Mother   . Heart disease Mother   . Thyroid disease Mother   . Diabetes Father   . Thyroid disease Father   . Hyperlipidemia Father   . CAD Father   . Breast cancer Paternal Aunt    positive family history multiple sclerosis     Past medical history, social, surgical and family history all reviewed in electronic medical record.   Review of Systems: No headache, visual changes, nausea, vomiting, diarrhea, constipation, dizziness, abdominal pain, skin rash, fevers, chills, night sweats, weight loss, swollen lymph nodes, body aches, joint swelling, muscle aches, chest pain, shortness of breath, mood changes.    Objective  Blood pressure 116/82, height 5\' 5"  (1.651 m), weight 190 lb (86.2 kg).  Systems examined below as of 01/12/16 General: NAD A&O x3 mood, affect normal  HEENT: Pupils equal, extraocular movements intact no nystagmus Respiratory: not short of breath at rest or with speaking Cardiovascular: No lower extremity edema, non tender Skin: Warm dry intact with no signs of infection or rash on extremities or on axial skeleton. Abdomen: Soft nontender, no masses Neuro: Cranial nerves  intact, neurovascularly intact in all extremities with 2+ DTRs and 2+ pulses. Lymph: No lymphadenopathy appreciated today  Gait normal with good balance and coordination.  MSK: Non tender with full range of motion and good stability and symmetric strength and tone of shoulders, elbows, wrist,  knee hips and ankles bilaterally.   Back Exam:  Inspection: Unremarkable  Motion: Flexion 45 deg, Extension 25 deg, Side Bending to 35 deg bilaterally,  Rotation to 45 deg bilaterally  SLR laying: Negative  XSLR laying: Negative  Palpable tenderness: Less tightness of the lumbar spine FABER: negative. Sensory change: Gross sensation intact to all lumbar and sacral dermatomes.  Reflexes: 2+ at both patellar tendons, 2+ at achilles tendons, Babinski's downgoing.  Strength at foot  Plantar-flexion: 5/5 Dorsi-flexion: 5/5 Eversion: 5/5 Inversion: 5/5  Leg strength  Quad: 5/5 Hamstring: 5/5 Hip flexor: 5/5 Hip abductors: 4+/5 mild worsening from previous exam Gait unremarkable.  Neck: Inspection unremarkable. No palpable stepoffs. Negative Spurling's  maneuver. Continue mild tightness. Seems to be stable from previous exam Grip strength and sensation normal in bilateral hands Strength good C4 to T1 distribution No sensory change to C4 to T1 Negative Hoffman sign bilaterally Reflexes normal Continued scapular dysfunction noted  OMT Physical Exam  Cervical  C2 flexed rotated and side bent left C6 flexed  rotated and side bent right  Thoracic T3 extended rotated and side bent right T7 extended rotated and side bent left with inhaled fifth rib  Lumbar L2 flexed rotated and side bent right L5 flexed rotated and side bent right  Sacrum Left on left    Impression and Recommendations:     This case required medical decision making of moderate complexity.

## 2016-01-12 ENCOUNTER — Ambulatory Visit (INDEPENDENT_AMBULATORY_CARE_PROVIDER_SITE_OTHER): Payer: 59 | Admitting: Family Medicine

## 2016-01-12 ENCOUNTER — Encounter: Payer: Self-pay | Admitting: Family Medicine

## 2016-01-12 VITALS — BP 116/82 | Ht 65.0 in | Wt 190.0 lb

## 2016-01-12 DIAGNOSIS — M999 Biomechanical lesion, unspecified: Secondary | ICD-10-CM | POA: Diagnosis not present

## 2016-01-12 DIAGNOSIS — G8929 Other chronic pain: Secondary | ICD-10-CM

## 2016-01-12 DIAGNOSIS — M546 Pain in thoracic spine: Secondary | ICD-10-CM

## 2016-01-12 NOTE — Assessment & Plan Note (Signed)
Patient continues to have some mild pain. This is all secondary to patient's poor posture. We discussed ergonomics and continuing to stay active. Patient is to work on this while she is working. Patient hasn't different medications for breakthrough. We'll continue the once weekly vitamin D. Patient follow-up and see me again in 6-8 weeks.

## 2016-01-12 NOTE — Assessment & Plan Note (Signed)
Decision today to treat with OMT was based on Physical Exam  After verbal consent patient was treated with HVLA, ME techniques in rib (ME only), thoracic and lumbar areas  Patient tolerated the procedure well with improvement in symptoms  Patient given exercises, stretches and lifestyle modifications  See medications in patient instructions if given  Patient will follow up in 6-8 weeks

## 2016-01-12 NOTE — Patient Instructions (Addendum)
Make him cook the [place].  Ice is your friend when needed Keep working on everything.  See me again 6-8 weeks.

## 2016-02-06 MED FILL — ONDANSETRON HCL 8 MG TABLET: 8 | 15 days supply | Qty: 45 | Fill #3

## 2016-02-06 MED FILL — ZOLPIDEM TARTRATE 10 MG TAB: 10 | 30 days supply | Qty: 30 | Fill #5

## 2016-02-06 MED FILL — PROMETHAZINE 25 MG TABLET: 25 | 8 days supply | Qty: 20 | Fill #2

## 2016-02-08 DIAGNOSIS — D649 Anemia, unspecified: Secondary | ICD-10-CM | POA: Diagnosis not present

## 2016-02-08 DIAGNOSIS — Z131 Encounter for screening for diabetes mellitus: Secondary | ICD-10-CM | POA: Diagnosis not present

## 2016-02-08 DIAGNOSIS — R7301 Impaired fasting glucose: Secondary | ICD-10-CM | POA: Diagnosis not present

## 2016-02-08 DIAGNOSIS — Z0001 Encounter for general adult medical examination with abnormal findings: Secondary | ICD-10-CM | POA: Diagnosis not present

## 2016-02-08 DIAGNOSIS — E559 Vitamin D deficiency, unspecified: Secondary | ICD-10-CM | POA: Diagnosis not present

## 2016-02-08 DIAGNOSIS — E039 Hypothyroidism, unspecified: Secondary | ICD-10-CM | POA: Diagnosis not present

## 2016-02-08 DIAGNOSIS — E785 Hyperlipidemia, unspecified: Secondary | ICD-10-CM | POA: Diagnosis not present

## 2016-02-08 DIAGNOSIS — Z6832 Body mass index (BMI) 32.0-32.9, adult: Secondary | ICD-10-CM | POA: Diagnosis not present

## 2016-02-08 DIAGNOSIS — N943 Premenstrual tension syndrome: Secondary | ICD-10-CM | POA: Diagnosis not present

## 2016-02-09 DIAGNOSIS — Z01419 Encounter for gynecological examination (general) (routine) without abnormal findings: Secondary | ICD-10-CM | POA: Diagnosis not present

## 2016-02-09 DIAGNOSIS — L309 Dermatitis, unspecified: Secondary | ICD-10-CM | POA: Diagnosis not present

## 2016-02-09 DIAGNOSIS — Z1151 Encounter for screening for human papillomavirus (HPV): Secondary | ICD-10-CM | POA: Diagnosis not present

## 2016-02-09 DIAGNOSIS — Z6832 Body mass index (BMI) 32.0-32.9, adult: Secondary | ICD-10-CM | POA: Diagnosis not present

## 2016-02-09 MED FILL — PHENTERMINE 37.5 MG TABLET: 37.5 | 30 days supply | Qty: 30 | Fill #0

## 2016-02-09 MED FILL — CLOBETASOL 0.05% OINTMENT: 0.05 | 28 days supply | Qty: 30 | Fill #0

## 2016-02-13 MED FILL — SAXENDA 18 MG/3 ML PEN: 18 | 30 days supply | Qty: 15 | Fill #0

## 2016-02-21 ENCOUNTER — Ambulatory Visit (INDEPENDENT_AMBULATORY_CARE_PROVIDER_SITE_OTHER): Payer: 59 | Admitting: Family Medicine

## 2016-02-21 ENCOUNTER — Encounter: Payer: Self-pay | Admitting: Family Medicine

## 2016-02-21 VITALS — BP 122/82 | Ht 65.0 in | Wt 193.0 lb

## 2016-02-21 DIAGNOSIS — G8929 Other chronic pain: Secondary | ICD-10-CM | POA: Diagnosis not present

## 2016-02-21 DIAGNOSIS — M999 Biomechanical lesion, unspecified: Secondary | ICD-10-CM | POA: Diagnosis not present

## 2016-02-21 DIAGNOSIS — M546 Pain in thoracic spine: Secondary | ICD-10-CM | POA: Diagnosis not present

## 2016-02-21 MED ORDER — ESOMEPRAZOLE MAGNESIUM 40 MG PO CPDR: 40.0000 mg | DELAYED_RELEASE_CAPSULE | Freq: Every day | ORAL | 3 refills | Status: DC

## 2016-02-21 NOTE — Assessment & Plan Note (Signed)
Patient has more of a thoracic back pain. Discussed with patient at great length. We discussed home exercises. We discussed that patient has had reflex previously and refill patient's exam. We discussed aches and what medications to date. Patient has been doing very well with her MS. We will continue to monitor. Continue manipulation every 2 months.

## 2016-02-21 NOTE — Assessment & Plan Note (Signed)
Decision today to treat with OMT was based on Physical Exam  After verbal consent patient was treated with HVLA, ME techniques in rib (ME only), thoracic and lumbar areas  Patient tolerated the procedure well with improvement in symptoms  Patient given exercises, stretches and lifestyle modifications  See medications in patient instructions if given  Patient will follow up in 8 weeks

## 2016-02-21 NOTE — Patient Instructions (Signed)
Good to see you  House and tree beautiful Very easy today Every 2-3 months.

## 2016-02-21 NOTE — Progress Notes (Signed)
[name] [place] [place], [place] [address] Phone: [phone] Subjective:     CC: Back and neck pain follow up  [id number]  Martha [name] is a 35 y.o. female coming in with complaint of back and upper neck pain. Has been doing very well. Continues to have stress at work. Denies any radiation to any of the extremities. Mild increase in fatigue but nothing severe. Continues to try to be active.   Past Medical History:  Diagnosis Date  . Asthma   . MS (multiple sclerosis) ([place])   . PCOS (polycystic ovarian syndrome)    Past Surgical History:  Procedure Laterality Date  . TONSILECTOMY, ADENOIDECTOMY, BILATERAL MYRINGOTOMY AND TUBES  1986   Social History   Social History  . Marital status: Married    Spouse name: N/A  . Number of children: 0  . Years of education: N/A   Occupational History  . physicain    Social History Main Topics  . Smoking status: Never Smoker  . Smokeless tobacco: Never Used  . Alcohol use No  . Drug use: No  . Sexual activity: Not on file   Other Topics Concern  . Not on file   Social History Narrative  . No narrative on file   No Known Allergies Family History  Problem Relation Age of Onset  . Diabetes Mother   . Heart disease Mother   . Thyroid disease Mother   . Diabetes Father   . Thyroid disease Father   . Hyperlipidemia Father   . CAD Father   . Breast cancer Paternal Aunt    positive family history multiple sclerosis     Past medical history, social, surgical and family history all reviewed in electronic medical record.  Review of Systems: No headache, visual changes, nausea, vomiting, diarrhea, constipation, dizziness, abdominal pain, skin rash, fevers, chills, night sweats, weight loss, swollen lymph nodes, body aches, joint swelling, muscle aches, chest pain, shortness of breath, mood changes.     Objective  Blood pressure 122/82, height 5\' 5"  (1.651 m), weight 193 lb (87.5  kg).  Systems examined below as of 02/21/16 General: NAD A&O x3 mood, affect normal  HEENT: Pupils equal, extraocular movements intact no nystagmus Respiratory: not short of breath at rest or with speaking Cardiovascular: No lower extremity edema, non tender Skin: Warm dry intact with no signs of infection or rash on extremities or on axial skeleton. Abdomen: Soft nontender, no masses Neuro: Cranial nerves  intact, neurovascularly intact in all extremities with 2+ DTRs and 2+ pulses. Lymph: No lymphadenopathy appreciated today  Gait normal with good balance and coordination.  MSK: Non tender with full range of motion and good stability and symmetric strength and tone of shoulders, elbows, wrist,  knee hips and ankles bilaterally.    Back Exam:  Inspection: Unremarkable  Motion: Flexion 45 deg, Extension 25 deg, Side Bending to 40 deg bilaterally,  Rotation to 40 deg bilaterally increase in side bending but decrease in rotation SLR laying: Negative  XSLR laying: Negative  Palpable tenderness: Mild increasing pain in the paraspinal musculature of L5-S1 bilaterally FABER: negative. Sensory change: Gross sensation intact to all lumbar and sacral dermatomes.  Reflexes: 2+ at both patellar tendons, 2+ at achilles tendons, Babinski's downgoing.  Strength at foot  Plantar-flexion: 5/5 Dorsi-flexion: 5/5 Eversion: 5/5 Inversion: 5/5  Leg strength  Quad: 5/5 Hamstring: 5/5 Hip flexor: 5/5 Hip abductors: 5/5 mild worsening from previous exam Gait unremarkable.  Neck: Inspection unremarkable.  No palpable stepoffs. Negative Spurling's maneuver. Continue mild tightness. Patient does have near full range of motion Grip strength and sensation normal in bilateral hands Strength good C4 to T1 distribution No sensory change to C4 to T1 Negative Hoffman sign bilaterally Reflexes normal Continued scapular dysfunction noted  OMT Physical Exam  Cervical  C2 flexed rotated and side bent left C7  flexed rotated and side bent right  Thoracic T2 extended rotated and side bent right T5 extended rotated and side bent left with inhaled fifth rib  Lumbar L2 flexed rotated and side bent right L4 flexed rotated and side bent right  Sacrum Left on left    Impression and Recommendations:     This case required medical decision making of moderate complexity.

## 2016-03-06 MED FILL — OSELTAMIVIR PHOS 75 MG CAP: 75 | 7 days supply | Qty: 7 | Fill #0

## 2016-03-15 MED FILL — ZOLPIDEM TARTRATE 10 MG TAB: 10 | 30 days supply | Qty: 30 | Fill #0

## 2016-04-21 NOTE — Progress Notes (Signed)
[name] [place], [place] [address] Phone: [phone] Subjective:     CC: Back and neck pain follow up  [id number]  Martha [name] is a 36 y.o. female coming in with complaint of back and upper neck pain. Has been doing very well. Continues to have stress at work. Denies any radiation to any of the extremities. Mild increase in fatigue but nothing severe. Continues to try to be active. Patient states that she has had more aged in the upper back recently. Nothing severe. Just worsening of previous symptoms. Has not affected her daily activities.   Past Medical History:  Diagnosis Date  . Asthma   . MS (multiple sclerosis) ([place])   . PCOS (polycystic ovarian syndrome)    Past Surgical History:  Procedure Laterality Date  . TONSILECTOMY, ADENOIDECTOMY, BILATERAL MYRINGOTOMY AND TUBES  1986   Social History   Social History  . Marital status: Married    Spouse name: N/A  . Number of children: 0  . Years of education: N/A   Occupational History  . physicain    Social History Main Topics  . Smoking status: Never Smoker  . Smokeless tobacco: Never Used  . Alcohol use No  . Drug use: No  . Sexual activity: Not on file   Other Topics Concern  . Not on file   Social History Narrative  . No narrative on file   No Known Allergies Family History  Problem Relation Age of Onset  . Diabetes Mother   . Heart disease Mother   . Thyroid disease Mother   . Diabetes Father   . Thyroid disease Father   . Hyperlipidemia Father   . CAD Father   . Breast cancer Paternal Aunt    positive family history multiple sclerosis     Past medical history, social, surgical and family history all reviewed in electronic medical record.   Review of Systems: No headache, visual changes, nausea, vomiting, diarrhea, constipation, dizziness, abdominal pain, skin rash, fevers, chills, night sweats, weight loss, swollen lymph nodes, body aches,  joint swelling,  chest pain, shortness of breath, mood changes. Positive muscle aches     Objective  Blood pressure 106/80, pulse 86, weight 231 lb (104.8 kg).  Systems examined below as of 04/23/16 General: NAD A&O x3 mood, affect normal  HEENT: Pupils equal, extraocular movements intact no nystagmus Respiratory: not short of breath at rest or with speaking Cardiovascular: No lower extremity edema, non tender Skin: Warm dry intact with no signs of infection or rash on extremities or on axial skeleton. Abdomen: Soft nontender, no masses Neuro: Cranial nerves  intact, neurovascularly intact in all extremities with 2+ DTRs and 2+ pulses. Lymph: No lymphadenopathy appreciated today  Gait normal with good balance and coordination.  MSK: Non tender with full range of motion and good stability and symmetric strength and tone of shoulders, elbows, wrist,  knee hips and ankles bilaterally.    Back Exam:  Inspection: Unremarkable  Motion: Flexion 45 deg, Extension 45 deg, Side Bending to 45 deg bilaterally,  Rotation to 45 deg bilaterally  SLR laying: Negative  XSLR laying: Negative  Palpable tenderness: Tender to palpation of the paraspinal musculature lumbar spine. FABER: negative. Sensory change: Gross sensation intact to all lumbar and sacral dermatomes.  Reflexes: 2+ at both patellar tendons, 2+ at achilles tendons, Babinski's downgoing.  Strength at foot  Plantar-flexion: 5/5 Dorsi-flexion: 5/5 Eversion: 5/5 Inversion: 5/5  Leg strength  Quad: 5/5 Hamstring:  5/5 Hip flexor: 5/5 Hip abductors: 5/5  Gait unremarkable.  Neck: Inspection unremarkable. No palpable stepoffs. Negative Spurling's maneuver. Full neck range of motion Grip strength and sensation normal in bilateral hands Strength good C4 to T1 distribution No sensory change to C4 to T1 Negative Hoffman sign bilaterally Reflexes normal  Osteopathic findings Cervical C2 flexed rotated and side bent right C4 flexed  rotated and side bent left C6 flexed rotated and side bent left T3 extended rotated and side bent right inhaled third rib T9 extended rotated and side bent left L2 flexed rotated and side bent right Sacrum right on right     Impression and Recommendations:     This case required medical decision making of moderate complexity.

## 2016-04-23 ENCOUNTER — Ambulatory Visit: Payer: 59 | Admitting: Family Medicine

## 2016-04-23 ENCOUNTER — Encounter: Payer: Self-pay | Admitting: Family Medicine

## 2016-04-23 ENCOUNTER — Ambulatory Visit (INDEPENDENT_AMBULATORY_CARE_PROVIDER_SITE_OTHER): Payer: 59 | Admitting: Family Medicine

## 2016-04-23 VITALS — BP 106/80 | HR 86 | Wt 231.0 lb

## 2016-04-23 DIAGNOSIS — M999 Biomechanical lesion, unspecified: Secondary | ICD-10-CM

## 2016-04-23 DIAGNOSIS — M546 Pain in thoracic spine: Secondary | ICD-10-CM | POA: Diagnosis not present

## 2016-04-23 DIAGNOSIS — G8929 Other chronic pain: Secondary | ICD-10-CM

## 2016-04-23 NOTE — Assessment & Plan Note (Signed)
Decision today to treat with OMT was based on Physical Exam  After verbal consent patient was treated with HVLA, ME, FPR techniques in cervical, thoracic, rib lumbar and sacral areas  Patient tolerated the procedure well with improvement in symptoms  Patient given exercises, stretches and lifestyle modifications  See medications in patient instructions if given  Patient will follow up in 8 weeks 

## 2016-04-23 NOTE — Assessment & Plan Note (Signed)
Significant thoracic back pain overall. Patient still needs work on core strength. Patient's on his work on [employer]. Still responds fairly well to osteopathic manipulation. Follow-up again in 8 weeks

## 2016-04-23 NOTE — Patient Instructions (Signed)
watch the shoulder Ice is your friend.  Keep hands within peripheral vision  See me again 2 months.

## 2016-04-26 MED FILL — ZOLPIDEM TARTRATE 10 MG TAB: 10 | 30 days supply | Qty: 30 | Fill #1

## 2016-04-26 MED FILL — PHENTERMINE 37.5 MG TABLET: 37.5 | 30 days supply | Qty: 30 | Fill #1

## 2016-05-18 MED FILL — FLUCONAZOLE 150 MG TABLET: 150 | 3 days supply | Qty: 2 | Fill #0

## 2016-06-14 MED FILL — ZOLPIDEM TARTRATE 10 MG TAB: 10 | 30 days supply | Qty: 30 | Fill #2

## 2016-06-14 MED FILL — PHENTERMINE 37.5 MG TABLET: 37.5 | 30 days supply | Qty: 30 | Fill #2

## 2016-06-15 DIAGNOSIS — Z3201 Encounter for pregnancy test, result positive: Secondary | ICD-10-CM | POA: Diagnosis not present

## 2016-06-18 ENCOUNTER — Ambulatory Visit (INDEPENDENT_AMBULATORY_CARE_PROVIDER_SITE_OTHER): Payer: 59 | Admitting: Family Medicine

## 2016-06-18 VITALS — BP 110/82 | HR 70 | Resp 16 | Wt 199.0 lb

## 2016-06-18 DIAGNOSIS — M999 Biomechanical lesion, unspecified: Secondary | ICD-10-CM

## 2016-06-18 DIAGNOSIS — O209 Hemorrhage in early pregnancy, unspecified: Secondary | ICD-10-CM | POA: Diagnosis not present

## 2016-06-18 DIAGNOSIS — Z3A01 Less than 8 weeks gestation of pregnancy: Secondary | ICD-10-CM | POA: Diagnosis not present

## 2016-06-18 NOTE — Progress Notes (Signed)
Pre-visit discussion using our clinic review tool. No additional management support is needed unless otherwise documented below in the visit note.  

## 2016-06-21 NOTE — Progress Notes (Signed)
Entered in error

## 2016-07-02 DIAGNOSIS — Z3A01 Less than 8 weeks gestation of pregnancy: Secondary | ICD-10-CM | POA: Diagnosis not present

## 2016-07-02 DIAGNOSIS — Z6833 Body mass index (BMI) 33.0-33.9, adult: Secondary | ICD-10-CM | POA: Diagnosis not present

## 2016-07-02 DIAGNOSIS — O039 Complete or unspecified spontaneous abortion without complication: Secondary | ICD-10-CM | POA: Diagnosis not present

## 2016-07-09 ENCOUNTER — Other Ambulatory Visit: Payer: Self-pay

## 2016-07-12 DIAGNOSIS — E282 Polycystic ovarian syndrome: Secondary | ICD-10-CM | POA: Diagnosis not present

## 2016-07-12 DIAGNOSIS — E669 Obesity, unspecified: Secondary | ICD-10-CM | POA: Diagnosis not present

## 2016-07-12 DIAGNOSIS — Z131 Encounter for screening for diabetes mellitus: Secondary | ICD-10-CM | POA: Diagnosis not present

## 2016-07-12 DIAGNOSIS — Z6833 Body mass index (BMI) 33.0-33.9, adult: Secondary | ICD-10-CM | POA: Diagnosis not present

## 2016-07-12 DIAGNOSIS — N943 Premenstrual tension syndrome: Secondary | ICD-10-CM | POA: Diagnosis not present

## 2016-07-12 MED FILL — NALTREXONE 50 MG TABLET: 50 | 30 days supply | Qty: 30 | Fill #0

## 2016-07-12 MED FILL — PROGESTERONE 100 MG CAPSULE: 100 | 30 days supply | Qty: 30 | Fill #0

## 2016-07-19 DIAGNOSIS — Z3201 Encounter for pregnancy test, result positive: Secondary | ICD-10-CM | POA: Diagnosis not present

## 2016-07-19 DIAGNOSIS — O09291 Supervision of pregnancy with other poor reproductive or obstetric history, first trimester: Secondary | ICD-10-CM | POA: Diagnosis not present

## 2016-07-19 DIAGNOSIS — Z8759 Personal history of other complications of pregnancy, childbirth and the puerperium: Secondary | ICD-10-CM | POA: Diagnosis not present

## 2016-07-19 DIAGNOSIS — Z3A01 Less than 8 weeks gestation of pregnancy: Secondary | ICD-10-CM | POA: Diagnosis not present

## 2016-07-19 MED FILL — ZOLPIDEM TARTRATE 10 MG TAB: 10 | 30 days supply | Qty: 30 | Fill #3

## 2016-07-23 DIAGNOSIS — O09291 Supervision of pregnancy with other poor reproductive or obstetric history, first trimester: Secondary | ICD-10-CM | POA: Diagnosis not present

## 2016-07-23 DIAGNOSIS — Z3A01 Less than 8 weeks gestation of pregnancy: Secondary | ICD-10-CM | POA: Diagnosis not present

## 2016-07-25 DIAGNOSIS — Z3A01 Less than 8 weeks gestation of pregnancy: Secondary | ICD-10-CM | POA: Diagnosis not present

## 2016-07-25 DIAGNOSIS — O09291 Supervision of pregnancy with other poor reproductive or obstetric history, first trimester: Secondary | ICD-10-CM | POA: Diagnosis not present

## 2016-07-27 DIAGNOSIS — O0281 Inappropriate change in quantitative human chorionic gonadotropin (hCG) in early pregnancy: Secondary | ICD-10-CM | POA: Diagnosis not present

## 2016-07-31 DIAGNOSIS — O0281 Inappropriate change in quantitative human chorionic gonadotropin (hCG) in early pregnancy: Secondary | ICD-10-CM | POA: Diagnosis not present

## 2016-08-08 DIAGNOSIS — O0281 Inappropriate change in quantitative human chorionic gonadotropin (hCG) in early pregnancy: Secondary | ICD-10-CM | POA: Diagnosis not present

## 2016-08-13 DIAGNOSIS — N96 Recurrent pregnancy loss: Secondary | ICD-10-CM | POA: Diagnosis not present

## 2016-08-13 MED FILL — PHENTERMINE 37.5 MG TABLET: 37.5 | 30 days supply | Qty: 30 | Fill #0

## 2016-08-13 MED FILL — NALTREXONE 50 MG TABLET: 50 | 30 days supply | Qty: 30 | Fill #1

## 2016-08-16 MED FILL — ZOLPIDEM TARTRATE 10 MG TAB: 10 | 30 days supply | Qty: 30 | Fill #4

## 2016-08-24 DIAGNOSIS — Z01812 Encounter for preprocedural laboratory examination: Secondary | ICD-10-CM | POA: Diagnosis not present

## 2016-08-24 DIAGNOSIS — Z31430 Encounter of female for testing for genetic disease carrier status for procreative management: Secondary | ICD-10-CM | POA: Diagnosis not present

## 2016-08-24 DIAGNOSIS — N96 Recurrent pregnancy loss: Secondary | ICD-10-CM | POA: Diagnosis not present

## 2016-09-06 DIAGNOSIS — N925 Other specified irregular menstruation: Secondary | ICD-10-CM | POA: Diagnosis not present

## 2016-09-06 DIAGNOSIS — N710 Acute inflammatory disease of uterus: Secondary | ICD-10-CM | POA: Diagnosis not present

## 2016-09-06 DIAGNOSIS — Z3202 Encounter for pregnancy test, result negative: Secondary | ICD-10-CM | POA: Diagnosis not present

## 2016-09-20 MED FILL — ZOLPIDEM TARTRATE 10 MG TAB: 10 | 30 days supply | Qty: 30 | Fill #0

## 2016-09-21 MED FILL — NALTREXONE 50 MG TABLET: 50 | 30 days supply | Qty: 30 | Fill #2

## 2016-09-21 MED FILL — PHENTERMINE 37.5 MG TABLET: 37.5 | 30 days supply | Qty: 30 | Fill #1

## 2016-10-09 ENCOUNTER — Ambulatory Visit: Payer: 59 | Admitting: Family Medicine

## 2016-10-25 MED FILL — ZOLPIDEM TARTRATE 10 MG TAB: 10 | 30 days supply | Qty: 30 | Fill #1

## 2016-10-26 MED FILL — PHENTERMINE 37.5 MG TABLET: 37.5 | 30 days supply | Qty: 30 | Fill #2

## 2016-10-26 MED FILL — NALTREXONE 50 MG TABLET: 50 | 30 days supply | Qty: 30 | Fill #3

## 2016-10-31 ENCOUNTER — Encounter: Payer: Self-pay | Admitting: Family Medicine

## 2016-10-31 ENCOUNTER — Ambulatory Visit (INDEPENDENT_AMBULATORY_CARE_PROVIDER_SITE_OTHER): Payer: 59 | Admitting: Family Medicine

## 2016-10-31 VITALS — BP 120/78 | HR 79 | Ht 65.0 in | Wt 200.0 lb

## 2016-10-31 DIAGNOSIS — M999 Biomechanical lesion, unspecified: Secondary | ICD-10-CM | POA: Diagnosis not present

## 2016-10-31 DIAGNOSIS — G8929 Other chronic pain: Secondary | ICD-10-CM

## 2016-10-31 DIAGNOSIS — M546 Pain in thoracic spine: Secondary | ICD-10-CM

## 2016-10-31 NOTE — Assessment & Plan Note (Signed)
Continues to have some tenderness in the area. Patient will continue to work on posture. We discussed ergonomics throughout the day. Patient does have significant amount of breast tissue that can also be causing some discomfort. Patient will come back and see me again in 4 weeks

## 2016-10-31 NOTE — Assessment & Plan Note (Signed)
Decision today to treat with OMT was based on Physical Exam  After verbal consent patient was treated with HVLA, ME, FPR techniques in cervical, thoracic, rib lumbar and sacral areas  Patient tolerated the procedure well with improvement in symptoms  Patient given exercises, stretches and lifestyle modifications  See medications in patient instructions if given  Patient will follow up in 4 weeks 

## 2016-10-31 NOTE — Patient Instructions (Signed)
Thanks for the pics See me again in 6-8 weeks.

## 2016-10-31 NOTE — Progress Notes (Signed)
[name] [place] [place], [place] [address] Phone: [phone] Subjective:    I'm seeing this patient by the request  of:    CC: Neck pain and back pain  [id number]  Martha [name] is a 36 y.o. female coming in with complaint of neck and back pain. 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 Patient has unfortunately had a couple miscarriages and has been very difficult things giving her some emotional strain. Patient is also known to have a past pedicle history significant for multiple sclerosis but has not had flares in quite some time. Patient is having some increasing tightness of the neck and back. No radiation of the pain. Seems to stay axial. Keeping her from working out on a regular basis.     Past Medical History:  Diagnosis Date  . Asthma   . MS (multiple sclerosis) ([place])   . PCOS (polycystic ovarian syndrome)    Past Surgical History:  Procedure Laterality Date  . TONSILECTOMY, ADENOIDECTOMY, BILATERAL MYRINGOTOMY AND TUBES  1986   Social History   Social History  . Marital status: Married    Spouse name: N/A  . Number of children: 0  . Years of education: N/A   Occupational History  . physicain    Social History Main Topics  . Smoking status: Never Smoker  . Smokeless tobacco: Never Used  . Alcohol use No  . Drug use: No  . Sexual activity: Not Asked   Other Topics Concern  . None   Social History Narrative  . None   No Known Allergies Family History  Problem Relation Age of Onset  . Diabetes Mother   . Heart disease Mother   . Thyroid disease Mother   . Diabetes Father   . Thyroid disease Father   . Hyperlipidemia Father   . CAD Father   . Breast cancer Paternal Aunt      Past medical history, social, surgical and family history all reviewed in electronic medical record.  No pertanent information unless stated regarding to the chief complaint.   Review of Systems:Review of systems  updated and as accurate as of 10/31/16  No headache, visual changes, nausea, vomiting, diarrhea, constipation, dizziness, abdominal pain, skin rash, fevers, chills, night sweats, weight loss, swollen lymph nodes, body aches, joint swelling,chest pain, shortness of breath, mood changes.  Positive muscle aches Objective  Blood pressure 120/78, pulse 79, height 5\' 5"  (1.651 m), weight 200 lb (90.7 kg). Systems examined below as of 10/31/16   General: No apparent distress alert and oriented x3 mood and affect normal, dressed appropriately.  HEENT: Pupils equal, extraocular movements intact  Respiratory: Patient's speak in full sentences and does not appear short of breath  Cardiovascular: No lower extremity edema, non tender, no erythema  Skin: Warm dry intact with no signs of infection or rash on extremities or on axial skeleton.  Abdomen: Soft nontender  Neuro: Cranial nerves II through XII are intact, neurovascularly intact in all extremities with 2+ DTRs and 2+ pulses.  Lymph: No lymphadenopathy of posterior or anterior cervical chain or axillae bilaterally.  Gait normal with good balance and coordination.  MSK:  Non tender with full range of motion and good stability and symmetric strength and tone of shoulders, elbows, wrist, hip, knee and ankles bilaterally. Mild hypermobility syndrome Neck: Inspection unremarkable. No palpable stepoffs. Negative Spurling's maneuver. Mild lack of the last 10 of extension Grip strength and sensation normal in bilateral hands  Strength good C4 to T1 distribution No sensory change to C4 to T1 Negative Hoffman sign bilaterally Reflexes normal  Back Exam:  Inspection: Mild loss of lordosis Motion: Flexion 45 deg, Extension 25 deg, Side Bending to 45 deg bilaterally,  Rotation to 45 deg bilaterally  SLR laying: Negative  XSLR laying: Negative  Palpable tenderness: Tenderness to palpation of the paraspinal musculature mostly at the lumbosacral area  record of left. FABER: Positive right. Sensory change: Gross sensation intact to all lumbar and sacral dermatomes.  Reflexes: 2+ at both patellar tendons, 2+ at achilles tendons, Babinski's downgoing.  Strength at foot  Plantar-flexion: 5/5 Dorsi-flexion: 5/5 Eversion: 5/5 Inversion: 5/5  Leg strength  Quad: 5/5 Hamstring: 5/5 Hip flexor: 5/5 Hip abductors: 5/5  Gait unremarkable.  Osteopathic findings C2 flexed rotated and side bent left C4 flexed rotated and side bent left C6 flexed rotated and side bent left T3 extended rotated and side bent left inhaled third rib T8 extended rotated and side bent right L2 flexed rotated and side bent right Sacrum right on right    Impression and Recommendations:     This case required medical decision making of moderate complexity.      Note: This dictation was prepared with Dragon dictation along with smaller phrase technology. Any transcriptional errors that result from this process are unintentional.

## 2016-11-01 DIAGNOSIS — E282 Polycystic ovarian syndrome: Secondary | ICD-10-CM | POA: Diagnosis not present

## 2016-11-01 DIAGNOSIS — F419 Anxiety disorder, unspecified: Secondary | ICD-10-CM | POA: Diagnosis not present

## 2016-11-01 DIAGNOSIS — E669 Obesity, unspecified: Secondary | ICD-10-CM | POA: Diagnosis not present

## 2016-11-01 DIAGNOSIS — Z6833 Body mass index (BMI) 33.0-33.9, adult: Secondary | ICD-10-CM | POA: Diagnosis not present

## 2016-11-09 MED FILL — SAXENDA 18 MG/3 ML PEN: 18 | 30 days supply | Qty: 15 | Fill #0

## 2016-11-16 MED FILL — ESOMEPRAZOLE MAG DR 40 MG C: 40 | 90 days supply | Qty: 90 | Fill #0

## 2016-11-27 MED FILL — NALTREXONE 50 MG TABLET: 50 | 30 days supply | Qty: 30 | Fill #4

## 2016-11-27 MED FILL — ZOLPIDEM TARTRATE 10 MG TAB: 10 | 30 days supply | Qty: 30 | Fill #2

## 2016-11-27 MED FILL — PHENTERMINE 37.5 MG TABLET: 37.5 | 30 days supply | Qty: 30 | Fill #0

## 2016-12-11 MED FILL — SAXENDA 18 MG/3 ML PEN: 18 | 30 days supply | Qty: 15 | Fill #1

## 2016-12-17 ENCOUNTER — Ambulatory Visit: Payer: 59 | Admitting: Family Medicine

## 2016-12-26 NOTE — Progress Notes (Signed)
[name] [place], [place] [address] Phone: [phone] Subjective:    I'm seeing this patient by the request  of:    CC: Back and neck pain  [id number]  Khiara Shuping is a 36 y.o. female coming in with complaint of back and neck pain. Patient has a past medical history significant for multiple sclerosis. Has been in remission for quite some time. Patient has responded well to a supine manipulation. Patient states Mild back pain overall. Continues to have some neck pain as well. Some increasing stress recently.    Past Medical History:  Diagnosis Date  . Asthma   . MS (multiple sclerosis) ([place])   . PCOS (polycystic ovarian syndrome)    Past Surgical History:  Procedure Laterality Date  . TONSILECTOMY, ADENOIDECTOMY, BILATERAL MYRINGOTOMY AND TUBES  1986   Social History   Social History  . Marital status: Married    Spouse name: N/A  . Number of children: 0  . Years of education: N/A   Occupational History  . physicain    Social History Main Topics  . Smoking status: Never Smoker  . Smokeless tobacco: Never Used  . Alcohol use No  . Drug use: No  . Sexual activity: Not Asked   Other Topics Concern  . None   Social History Narrative  . None   No Known Allergies Family History  Problem Relation Age of Onset  . Diabetes Mother   . Heart disease Mother   . Thyroid disease Mother   . Diabetes Father   . Thyroid disease Father   . Hyperlipidemia Father   . CAD Father   . Breast cancer Paternal Aunt      Past medical history, social, surgical and family history all reviewed in electronic medical record.  No pertanent information unless stated regarding to the chief complaint.   Review of Systems:Review of systems updated and as accurate as of 12/27/16  No visual changes, nausea, vomiting, diarrhea, constipation, dizziness, abdominal pain, skin rash, fevers, chills, night sweats, weight loss, swollen lymph  nodes, body aches, joint swelling,  chest pain, shortness of breath, mood changes. Positive headaches, muscle aches  Objective  Blood pressure 110/80, height 5\' 5"  (1.651 m), weight 199 lb (90.3 kg). Systems examined below as of 12/27/16   General: No apparent distress alert and oriented x3 mood and affect normal, dressed appropriately.  HEENT: Pupils equal, extraocular movements intact  Respiratory: Patient's speak in full sentences and does not appear short of breath  Cardiovascular: No lower extremity edema, non tender, no erythema  Skin: Warm dry intact with no signs of infection or rash on extremities or on axial skeleton.  Abdomen: Soft nontender  Neuro: Cranial nerves II through XII are intact, neurovascularly intact in all extremities with 2+ DTRs and 2+ pulses.  Lymph: No lymphadenopathy of posterior or anterior cervical chain or axillae bilaterally.  Gait normal with good balance and coordination.  MSK:  Non tender with full range of motion and good stability and symmetric strength and tone of shoulders, elbows, wrist, hip, knee and ankles bilaterally.  Neck: Inspection loss of lordosis. No palpable stepoffs. Negative Spurling's maneuver. Mild limitation in range of motion Grip strength and sensation normal in bilateral hands Strength good C4 to T1 distribution No sensory change to C4 to T1 Negative Hoffman sign bilaterally Reflexes normal Back Exam:  Inspection: Unremarkable  Motion: Flexion 45 deg, Extension 45 deg, Side Bending to 45 deg bilaterally,  Rotation  to 45 deg bilaterally  SLR laying: Negative  XSLR laying: Negative  Palpable tenderness: Tender to palpation in the per spinal musculature lumbar spine or the lumbosacral juncture. FABER: negative. Sensory change: Gross sensation intact to all lumbar and sacral dermatomes.  Reflexes: 2+ at both patellar tendons, 2+ at achilles tendons, Babinski's downgoing.  Strength at foot  Plantar-flexion: 5/5 Dorsi-flexion:  5/5 Eversion: 5/5 Inversion: 5/5  Leg strength  Quad: 5/5 Hamstring: 5/5 Hip flexor: 5/5 Hip abductors: 5/5  Gait unremarkable.  Osteopathic findings C2 flexed rotated and side bent right C4 flexed rotated and side bent left C7 flexed rotated and side bent left T3 extended rotated and side bent right inhaled third rib T6 extended rotated and side bent left L2 flexed rotated and side bent right Sacrum right on right     Impression and Recommendations:     This case required medical decision making of moderate complexity.      Note: This dictation was prepared with Dragon dictation along with smaller phrase technology. Any transcriptional errors that result from this process are unintentional.

## 2016-12-27 ENCOUNTER — Ambulatory Visit (INDEPENDENT_AMBULATORY_CARE_PROVIDER_SITE_OTHER): Payer: 59 | Admitting: Family Medicine

## 2016-12-27 ENCOUNTER — Encounter: Payer: Self-pay | Admitting: Family Medicine

## 2016-12-27 VITALS — BP 110/80 | Ht 65.0 in | Wt 199.0 lb

## 2016-12-27 DIAGNOSIS — G8929 Other chronic pain: Secondary | ICD-10-CM

## 2016-12-27 DIAGNOSIS — M999 Biomechanical lesion, unspecified: Secondary | ICD-10-CM

## 2016-12-27 DIAGNOSIS — M546 Pain in thoracic spine: Secondary | ICD-10-CM

## 2016-12-27 MED ORDER — METFORMIN HCL 500 MG PO TABS: 500.0000 mg | ORAL_TABLET | Freq: Every day | ORAL | 3 refills | Status: DC

## 2016-12-27 MED FILL — metFORMIN HCL 500 MG TABS: 500 | 30 days supply | Qty: 30 | Fill #0

## 2016-12-27 NOTE — Assessment & Plan Note (Addendum)
Decision today to treat with OMT was based on Physical Exam  After verbal consent patient was treated with HVLA, ME, FPR techniques in cervical, thoracic, rib, lumbar and sacral areas  Patient tolerated the procedure well with improvement in symptoms  Patient given exercises, stretches and lifestyle modifications  See medications in patient instructions if given  Patient will follow up in 6-8 weeks 

## 2016-12-27 NOTE — Assessment & Plan Note (Signed)
Patient continues to have thoracic back pain. We discussed icing regimen and home exercise. We discussed which activities to do an which was to avoid. Patient follow-up again in 6-8 weeks

## 2016-12-27 NOTE — Patient Instructions (Signed)
Good to see you  Ice is your friend Metformin 500mg  daily  See me when you need me

## 2017-01-01 MED FILL — ZOLPIDEM TARTRATE 10 MG TAB: 10 | 30 days supply | Qty: 30 | Fill #3

## 2017-01-04 ENCOUNTER — Other Ambulatory Visit: Payer: Self-pay | Admitting: Neurology

## 2017-01-04 DIAGNOSIS — R112 Nausea with vomiting, unspecified: Secondary | ICD-10-CM

## 2017-01-04 DIAGNOSIS — G43011 Migraine without aura, intractable, with status migrainosus: Secondary | ICD-10-CM

## 2017-01-04 MED ORDER — METHYLPREDNISOLONE 4 MG PO TBPK: ORAL_TABLET | ORAL | 1 refills | Status: DC

## 2017-01-04 MED ORDER — RIZATRIPTAN BENZOATE 10 MG PO TABS: 10.0000 mg | ORAL_TABLET | ORAL | 11 refills | Status: DC | PRN

## 2017-01-04 MED ORDER — ONDANSETRON 4 MG PO TBDP: 4.0000 mg | ORAL_TABLET | Freq: Three times a day (TID) | ORAL | 11 refills | Status: DC | PRN

## 2017-01-04 MED FILL — ONDANSETRON ODT 4 MG TABLET: 4 | 20 days supply | Qty: 60 | Fill #0

## 2017-01-04 MED FILL — METHYLPREDNISOLONE 4 MG TAB: 4 | 6 days supply | Qty: 21 | Fill #0

## 2017-01-04 MED FILL — RIZATRIPTAN 10 MG TABLET: 10 | 30 days supply | Qty: 10 | Fill #0

## 2017-01-06 ENCOUNTER — Telehealth: Payer: Self-pay | Admitting: Neurology

## 2017-01-06 NOTE — Telephone Encounter (Signed)
Martha [name], can you add this patient to our schedule Tuesday at 430 please? New patient for migraines thanks

## 2017-01-07 NOTE — Telephone Encounter (Signed)
Appt scheduled for Tuesday 11/6 @ 4:30 pm arrival time 4:00. Confirmed with patient.

## 2017-01-08 ENCOUNTER — Encounter: Payer: Self-pay | Admitting: Neurology

## 2017-01-08 ENCOUNTER — Ambulatory Visit: Payer: 59 | Admitting: Neurology

## 2017-01-08 DIAGNOSIS — G43001 Migraine without aura, not intractable, with status migrainosus: Secondary | ICD-10-CM | POA: Diagnosis not present

## 2017-01-08 NOTE — Progress Notes (Signed)
[facility]    Provider:  Dr Jaynee Eagles Referring Provider: No ref. provider found Primary Care Physician:  Patient, No Pcp Per  CC:  migraines  HPI:  Martha [name] is a 36 y.o. female here for migraines. She has a hx of MS diagnosed in 2009 with optic neuritis. Past headaches were more tension type, like a bad frontal headaches, more pressure, she would have a caffeine headache that would be throbbing sometimes. GI issue and then developed a headache starting last Tuesday that was intractable and continuous, on the left side behind the left eye, nothing helped, it was pounding behind the left eye and progressively worsened all week, finally felt better 5 days later, she had associated nausea, no vomiting, she endorsed sound sensitivity. Her mother had migraines. She had nausea and vomited and diarhhea.   Review of Systems: Patient complains of symptoms per HPI as well as the following symptoms headache, MS. Pertinent negatives and positives per HPI. All others negative.   Social History   Socioeconomic History  . Marital status: Married    Spouse name: Not on file  . Number of children: 0  . Years of education: Not on file  . Highest education level: Not on file  Social Needs  . Financial resource strain: Not on file  . Food insecurity - worry: Not on file  . Food insecurity - inability: Not on file  . Transportation needs - medical: Not on file  . Transportation needs - non-medical: Not on file  Occupational History  . Occupation: physicain  Tobacco Use  . Smoking status: Never Smoker  . Smokeless tobacco: Never Used  Substance and Sexual Activity  . Alcohol use: No  . Drug use: No  . Sexual activity: Not on file  Other Topics Concern  . Not on file  Social History Narrative   Right handed   1-2 cups caffeine daily   Lives at home with husband    Family History  Problem Relation Age of Onset  . Diabetes Mother   . Heart disease Mother   . Thyroid  disease Mother   . Hypertension Mother   . Hypercholesterolemia Mother   . Diabetes Father   . Thyroid disease Father   . Hyperlipidemia Father   . CAD Father   . Hypercholesterolemia Father   . Hypertension Father   . Breast cancer Paternal Aunt     Past Medical History:  Diagnosis Date  . Asthma   . MS (multiple sclerosis) ([place])   . PCOS (polycystic ovarian syndrome)   . Raynaud's disease     Past Surgical History:  Procedure Laterality Date  . TONSILECTOMY, ADENOIDECTOMY, BILATERAL MYRINGOTOMY AND TUBES  1986    Current Outpatient Medications  Medication Sig Dispense Refill  . esomeprazole (NEXIUM) 40 MG capsule Take 1 capsule (40 mg total) by mouth daily. 90 capsule 3  . zolpidem (AMBIEN) 10 MG tablet Take 5 mg as needed by mouth.   5  . loratadine (CLARITIN) 10 MG tablet Take 10 mg by mouth daily as needed for allergies.    . metFORMIN (GLUCOPHAGE) 500 MG tablet Take 1 tablet (500 mg total) by mouth daily with breakfast. 30 tablet 3  . methylPREDNISolone (MEDROL DOSEPAK) 4 MG TBPK tablet follow package directions (Patient not taking: Reported on 01/08/2017) 21 tablet 1  . ondansetron (ZOFRAN ODT) 4 MG disintegrating tablet Take 1 tablet (4 mg total) by mouth every 8 (eight) hours as needed for nausea or vomiting. (Patient not taking:  Reported on 01/08/2017) 60 tablet 11  . rizatriptan (MAXALT) 10 MG tablet Take 1 tablet (10 mg total) by mouth as needed for migraine. May repeat in 2 hours if needed (Patient not taking: Reported on 01/08/2017) 10 tablet 11   No current facility-administered medications for this visit.     Allergies as of 01/08/2017  . (No Known Allergies)    Vitals: BP 121/86 (BP Location: Right Arm, Patient Position: Sitting)   Pulse 77   Wt 201 lb 12.8 oz (91.5 kg)   BMI 33.58 kg/m  Last Weight:  Wt Readings from Last 1 Encounters:  01/08/17 201 lb 12.8 oz (91.5 kg)   Last Height:   Ht Readings from Last 1 Encounters:  12/27/16 5\' 5"  (1.651 m)     Physical exam: Exam: Gen: NAD, conversant, well nourised, obese, well groomed                     CV: RRR, no MRG. No Carotid Bruits. No peripheral edema, warm, nontender Eyes: Conjunctivae clear without exudates or hemorrhage  Neuro: Detailed Neurologic Exam  Speech:    Speech is normal; fluent and spontaneous with normal comprehension.  Cognition:    The patient is oriented to person, place, and time;     recent and remote memory intact;     language fluent;     normal attention, concentration,     fund of knowledge Cranial Nerves:    The pupils are equal, round, and reactive to light. The fundi are normal and spontaneous venous pulsations are present. Visual fields are full to finger confrontation. Extraocular movements are intact. Trigeminal sensation is intact and the muscles of mastication are normal. The face is symmetric. The palate elevates in the midline. Hearing intact. Voice is normal. Shoulder shrug is normal. The tongue has normal motion without fasciculations.   Coordination:    Normal finger to nose and heel to shin. Normal rapid alternating movements.   Gait:    Heel-toe and tandem gait are normal.   Motor Observation:    No asymmetry, no atrophy, and no involuntary movements noted. Tone:    Normal muscle tone.    Posture:    Posture is normal. normal erect    Strength:    Strength is V/V in the upper and lower limbs.      Sensation: intact to LT     Reflex Exam:  DTR's:    Deep tendon reflexes in the upper and lower extremities are normal bilaterally.   Toes:    The toes are downgoing bilaterally.   Clonus:    Clonus is absent.       Assessment/Plan:  36 year old patient with migraine without aura not intractable  Steroid taper for status migrainosus Maxalt at onset of headache (contraindicated if pregnant)  Discussed: To prevent or relieve headaches, try the following: Cool Compress. Lie down and place a cool compress on your head.   Avoid headache triggers. If certain foods or odors seem to have triggered your migraines in the past, avoid them. A headache diary might help you identify triggers.  Include physical activity in your daily routine. Try a daily walk or other moderate aerobic exercise.  Manage stress. Find healthy ways to cope with the stressors, such as delegating tasks on your to-do list.  Practice relaxation techniques. Try deep breathing, yoga, massage and visualization.  Eat regularly. Eating regularly scheduled meals and maintaining a healthy diet might help prevent headaches. Also, drink plenty  of fluids.  Follow a regular sleep schedule. Sleep deprivation might contribute to headaches Consider biofeedback. With this mind-body technique, you learn to control certain bodily functions - such as muscle tension, heart rate and blood pressure - to prevent headaches or reduce headache pain.    Proceed to emergency room if you experience new or worsening symptoms or symptoms do not resolve, if you have new neurologic symptoms or if headache is severe, or for any concerning symptom.   Provided education and documentation from American headache Society toolbox including articles on: chronic migraine medication overuse headache, chronic migraines, prevention of migraines, behavioral and other nonpharmacologic treatments for headache.   Sarina Ill, MD  [facility] [address] [place], [place] [address]  Phone [phone] Fax [phone]  A total of 40 minutes was spent face-to-face with this patient. Over half this time was spent on counseling patient on the migraine diagnosis and different diagnostic and therapeutic options available.

## 2017-01-09 DIAGNOSIS — Z131 Encounter for screening for diabetes mellitus: Secondary | ICD-10-CM | POA: Diagnosis not present

## 2017-01-09 DIAGNOSIS — E559 Vitamin D deficiency, unspecified: Secondary | ICD-10-CM | POA: Diagnosis not present

## 2017-01-09 DIAGNOSIS — Z6832 Body mass index (BMI) 32.0-32.9, adult: Secondary | ICD-10-CM | POA: Diagnosis not present

## 2017-01-09 DIAGNOSIS — N943 Premenstrual tension syndrome: Secondary | ICD-10-CM | POA: Diagnosis not present

## 2017-01-09 DIAGNOSIS — E669 Obesity, unspecified: Secondary | ICD-10-CM | POA: Diagnosis not present

## 2017-01-09 MED FILL — PROGESTERONE 100 MG CAPSULE: 100 | 30 days supply | Qty: 30 | Fill #0

## 2017-01-10 ENCOUNTER — Encounter: Payer: Self-pay | Admitting: Neurology

## 2017-01-10 DIAGNOSIS — G43001 Migraine without aura, not intractable, with status migrainosus: Secondary | ICD-10-CM | POA: Insufficient documentation

## 2017-01-10 NOTE — Patient Instructions (Signed)
Rizatriptan tablets What is this medicine? RIZATRIPTAN (rye za TRIP tan) is used to treat migraines with or without aura. An aura is a strange feeling or visual disturbance that warns you of an attack. It is not used to prevent migraines. This medicine may be used for other purposes; ask your health care provider or pharmacist if you have questions. COMMON BRAND NAME(S): Maxalt What should I tell my health care provider before I take this medicine? They need to know if you have any of these conditions: -bowel disease or colitis -diabetes -family history of heart disease -fast or irregular heart beat -heart or blood vessel disease, angina (chest pain), or previous heart attack -high blood pressure -high cholesterol -history of stroke, transient ischemic attacks (TIAs or mini-strokes), or intracranial bleeding -kidney or liver disease -overweight -poor circulation -postmenopausal or surgical removal of uterus and ovaries -Raynaud's disease -seizure disorder -an unusual or allergic reaction to rizatriptan, other medicines, foods, dyes, or preservatives -pregnant or trying to get pregnant -breast-feeding How should I use this medicine? This medicine is taken by mouth with a glass of water. Follow the directions on the prescription label. This medicine is taken at the first symptoms of a migraine. It is not for everyday use. If your migraine headache returns after one dose, you can take another dose as directed. You must leave at least 2 hours between doses, and do not take more than 30 mg total in 24 hours. If there is no improvement at all after the first dose, do not take a second dose without talking to your doctor or health care professional. Do not take your medicine more often than directed. Talk to your pediatrician regarding the use of this medicine in children. While this drug may be prescribed for children as young as 6 years for selected conditions, precautions do apply. Overdosage:  If you think you have taken too much of this medicine contact a poison control center or emergency room at once. NOTE: This medicine is only for you. Do not share this medicine with others. What if I miss a dose? This does not apply; this medicine is not for regular use. What may interact with this medicine? Do not take this medicine with any of the following medicines: -amphetamine, dextroamphetamine or cocaine -dihydroergotamine, ergotamine, ergoloid mesylates, methysergide, or ergot-type medication - do not take within 24 hours of taking rizatriptan -feverfew -MAOIs like Carbex, Eldepryl, Marplan, Nardil, and Parnate - do not take rizatriptan within 2 weeks of stopping MAOI therapy. -other migraine medicines like almotriptan, eletriptan, naratriptan, sumatriptan, zolmitriptan - do not take within 24 hours of taking rizatriptan -tryptophan This medicine may also interact with the following medications: -medicines for mental depression, anxiety or mood problems -propranolol This list may not describe all possible interactions. Give your health care provider a list of all the medicines, herbs, non-prescription drugs, or dietary supplements you use. Also tell them if you smoke, drink alcohol, or use illegal drugs. Some items may interact with your medicine. What should I watch for while using this medicine? Only take this medicine for a migraine headache. Take it if you get warning symptoms or at the start of a migraine attack. It is not for regular use to prevent migraine attacks. You may get drowsy or dizzy. Do not drive, use machinery, or do anything that needs mental alertness until you know how this medicine affects you. To reduce dizzy or fainting spells, do not sit or stand up quickly, especially if you are an older   patient. Alcohol can increase drowsiness, dizziness and flushing. Avoid alcoholic drinks. Smoking cigarettes may increase the risk of heart-related side effects from using this  medicine. If you take migraine medicines for 10 or more days a month, your migraines may get worse. Keep a diary of headache days and medicine use. Contact your healthcare professional if your migraine attacks occur more frequently. What side effects may I notice from receiving this medicine? Side effects that you should report to your doctor or health care professional as soon as possible: -allergic reactions like skin rash, itching or hives, swelling of the face, lips, or tongue -fast, slow, or irregular heart beat -increased or decreased blood pressure -seizures -severe stomach pain and cramping, bloody diarrhea -signs and symptoms of a blood clot such as breathing problems; changes in vision; chest pain; severe, sudden headache; pain, swelling, warmth in the leg; trouble speaking; sudden numbness or weakness of the face, arm or leg -tingling, pain, or numbness in the face, hands, or feet Side effects that usually do not require medical attention (report to your doctor or health care professional if they continue or are bothersome): -drowsiness -dry mouth -feeling warm, flushing, or redness of the face -headache -muscle cramps, pain -nausea, vomiting -unusually weak or tired This list may not describe all possible side effects. Call your doctor for medical advice about side effects. You may report side effects to FDA at 1-800-FDA-1088. Where should I keep my medicine? Keep out of the reach of children. Store at room temperature between 15 and 30 degrees C (59 and 86 degrees F). Keep container tightly closed. Throw away any unused medicine after the expiration date. NOTE: This sheet is a summary. It may not cover all possible information. If you have questions about this medicine, talk to your doctor, pharmacist, or health care provider.  2018 Elsevier/Gold Standard (2012-10-21 10:16:39)  

## 2017-01-28 MED FILL — SAXENDA 18 MG/3 ML PEN: 18 | 30 days supply | Qty: 15 | Fill #2

## 2017-02-05 MED FILL — PHENTERMINE 37.5 MG TABLET: 37.5 | 30 days supply | Qty: 30 | Fill #1

## 2017-02-05 MED FILL — ZOLPIDEM TARTRATE 10 MG TAB: 10 | 30 days supply | Qty: 30 | Fill #4

## 2017-02-06 ENCOUNTER — Ambulatory Visit: Payer: 59 | Admitting: Family Medicine

## 2017-02-16 NOTE — Progress Notes (Signed)
[name] [name], [name] [address] Phone: [phone] Subjective:    I'm seeing this patient by the request  of:    CC: Back pain and neck pain follow-up  [id number]  Martha [name] is a 36 y.o. female coming in with complaint of neck and back pain.  History of migraines as well. She has been feeling good and feels that she needs to get adjustment.  Patient has had some increasing stress recently.  Did do some traveling but unfortunately had to come home early secondary to the weather.  Patient denies any radiation of the arm.     Past Medical History:  Diagnosis Date  . Asthma   . MS (multiple sclerosis) ([place])   . PCOS (polycystic ovarian syndrome)   . Raynaud's disease    Past Surgical History:  Procedure Laterality Date  . TONSILECTOMY, ADENOIDECTOMY, BILATERAL MYRINGOTOMY AND TUBES  1986   Social History   Socioeconomic History  . Marital status: Married    Spouse name: Not on file  . Number of children: 0  . Years of education: Not on file  . Highest education level: Not on file  Social Needs  . Financial resource strain: Not on file  . Food insecurity - worry: Not on file  . Food insecurity - inability: Not on file  . Transportation needs - medical: Not on file  . Transportation needs - non-medical: Not on file  Occupational History  . Occupation: physicain  Tobacco Use  . Smoking status: Never Smoker  . Smokeless tobacco: Never Used  Substance and Sexual Activity  . Alcohol use: No  . Drug use: No  . Sexual activity: Not on file  Other Topics Concern  . Not on file  Social History Narrative   Right handed   1-2 cups caffeine daily   Lives at home with husband   No Known Allergies Family History  Problem Relation Age of Onset  . Diabetes Mother   . Heart disease Mother   . Thyroid disease Mother   . Hypertension Mother   . Hypercholesterolemia Mother   . Migraines Mother   . Diabetes Father   .  Thyroid disease Father   . Hyperlipidemia Father   . CAD Father   . Hypercholesterolemia Father   . Hypertension Father   . Breast cancer Paternal Aunt      Past medical history, social, surgical and family history all reviewed in electronic medical record.  No pertanent information unless stated regarding to the chief complaint.   Review of Systems:Review of systems updated and as accurate as of 02/16/17  No headache, visual changes, nausea, vomiting, diarrhea, constipation, dizziness, abdominal pain, skin rash, fevers, chills, night sweats, weight loss, swollen lymph nodes, body aches, joint swelling, muscle aches, chest pain, shortness of breath, mood changes.   Objective  There were no vitals taken for this visit. Systems examined below as of 02/16/17   General: No apparent distress alert and oriented x3 mood and affect normal, dressed appropriately.  HEENT: Pupils equal, extraocular movements intact  Respiratory: Patient's speak in full sentences and does not appear short of breath  Cardiovascular: No lower extremity edema, non tender, no erythema  Skin: Warm dry intact with no signs of infection or rash on extremities or on axial skeleton.  Abdomen: Soft nontender  Neuro: Cranial nerves II through XII are intact, neurovascularly intact in all extremities with 2+ DTRs and 2+ pulses.  Lymph: No lymphadenopathy of  posterior or anterior cervical chain or axillae bilaterally.  Gait normal with good balance and coordination.  MSK:  Non tender with full range of motion and good stability and symmetric strength and tone of shoulders, elbows, wrist, hip, knee and ankles bilaterally.     Impression and Recommendations:     This case required medical decision making of moderate complexity.      Note: This dictation was prepared with Dragon dictation along with smaller phrase technology. Any transcriptional errors that result from this process are unintentional.

## 2017-02-18 ENCOUNTER — Ambulatory Visit (INDEPENDENT_AMBULATORY_CARE_PROVIDER_SITE_OTHER): Payer: 59 | Admitting: Family Medicine

## 2017-02-18 ENCOUNTER — Encounter: Payer: Self-pay | Admitting: Family Medicine

## 2017-02-18 VITALS — BP 112/88 | HR 95 | Ht 65.0 in | Wt 202.0 lb

## 2017-02-18 DIAGNOSIS — G8929 Other chronic pain: Secondary | ICD-10-CM | POA: Diagnosis not present

## 2017-02-18 DIAGNOSIS — M546 Pain in thoracic spine: Secondary | ICD-10-CM

## 2017-02-18 DIAGNOSIS — M999 Biomechanical lesion, unspecified: Secondary | ICD-10-CM | POA: Diagnosis not present

## 2017-02-18 NOTE — Assessment & Plan Note (Signed)
Decision today to treat with OMT was based on Physical Exam  After verbal consent patient was treated with HVLA, ME, FPR techniques in cervical, thoracic, rib, lumbar and sacral areas  Patient tolerated the procedure well with improvement in symptoms  Patient given exercises, stretches and lifestyle modifications  See medications in patient instructions if given  Patient will follow up in 6-8 weeks 

## 2017-02-18 NOTE — Assessment & Plan Note (Signed)
Stable overall.  Encourage patient to do the exercises on a more regular basis.  We discussed core strengthening and stability.  Patient will start to increase activity slowly over the course the next all days.  Patient will follow up with me again in 6 weeks.

## 2017-02-22 ENCOUNTER — Other Ambulatory Visit: Payer: Self-pay | Admitting: Family Medicine

## 2017-02-22 MED FILL — metFORMIN HCL 500 MG TABS: 500 | 30 days supply | Qty: 30 | Fill #1

## 2017-02-25 MED FILL — ESOMEPRAZOLE MAG DR 40 MG C: 40 | 90 days supply | Qty: 90 | Fill #0

## 2017-02-25 NOTE — Telephone Encounter (Signed)
Refill done.  

## 2017-02-27 MED FILL — SAXENDA 18 MG/3 ML PEN: 18 | 30 days supply | Qty: 15 | Fill #3

## 2017-04-01 DIAGNOSIS — Z131 Encounter for screening for diabetes mellitus: Secondary | ICD-10-CM | POA: Diagnosis not present

## 2017-04-01 DIAGNOSIS — E559 Vitamin D deficiency, unspecified: Secondary | ICD-10-CM | POA: Diagnosis not present

## 2017-04-01 DIAGNOSIS — N943 Premenstrual tension syndrome: Secondary | ICD-10-CM | POA: Diagnosis not present

## 2017-04-02 NOTE — Progress Notes (Signed)
[name] [name], [name] [address] Phone: [phone] Subjective:    I'm seeing this patient by the request  of:    CC: Low back and thoracic back pain  [id number]  Martha [name] is a 37 y.o. female coming in with complaint of thoracic and low back pain.  Patient has responded fairly well to osteopathic manipulation.  History of multiple sclerosis.  We discussed icing regimen as well as working on posture.  Patient states that her back has been ok. She thought that a rib on the right side may have popped out. She feels like she has a viral neuritis.        Past Medical History:  Diagnosis Date  . Asthma   . MS (multiple sclerosis) ([place])   . PCOS (polycystic ovarian syndrome)   . Raynaud's disease    Past Surgical History:  Procedure Laterality Date  . TONSILECTOMY, ADENOIDECTOMY, BILATERAL MYRINGOTOMY AND TUBES  1986   Social History   Socioeconomic History  . Marital status: Married    Spouse name: Not on file  . Number of children: 0  . Years of education: Not on file  . Highest education level: Not on file  Social Needs  . Financial resource strain: Not on file  . Food insecurity - worry: Not on file  . Food insecurity - inability: Not on file  . Transportation needs - medical: Not on file  . Transportation needs - non-medical: Not on file  Occupational History  . Occupation: physicain  Tobacco Use  . Smoking status: Never Smoker  . Smokeless tobacco: Never Used  Substance and Sexual Activity  . Alcohol use: No  . Drug use: No  . Sexual activity: Not on file  Other Topics Concern  . Not on file  Social History Narrative   Right handed   1-2 cups caffeine daily   Lives at home with husband   No Known Allergies Family History  Problem Relation Age of Onset  . Diabetes Mother   . Heart disease Mother   . Thyroid disease Mother   . Hypertension Mother   . Hypercholesterolemia Mother   . Migraines  Mother   . Diabetes Father   . Thyroid disease Father   . Hyperlipidemia Father   . CAD Father   . Hypercholesterolemia Father   . Hypertension Father   . Breast cancer Paternal Aunt      Past medical history, social, surgical and family history all reviewed in electronic medical record.  No pertanent information unless stated regarding to the chief complaint.   Review of Systems:Review of systems updated and as accurate as of 04/03/17  No  visual changes, nausea, vomiting, diarrhea, constipation, dizziness, abdominal pain, skin rash, fevers, chills, night sweats, weight loss, swollen lymph nodes, body aches, joint swelling, muscle aches, chest pain, shortness of breath, mood changes.  Mild positive headache  Objective  Blood pressure 112/88, height 5\' 5"  (1.651 m), weight 194 lb (88 kg). Systems examined below as of 04/03/17   General: No apparent distress alert and oriented x3 mood and affect normal, dressed appropriately.  HEENT: Pupils equal, extraocular movements intact but nystagmus of the right eye noted Respiratory: Patient's speak in full sentences and does not appear short of breath  Cardiovascular: No lower extremity edema, non tender, no erythema  Skin: Warm dry intact with no signs of infection or rash on extremities or on axial skeleton.  Abdomen: Soft nontender  Neuro: Cranial  nerves II through XII are intact, neurovascularly intact in all extremities with 2+ DTRs and 2+ pulses.  Lymph: No lymphadenopathy of posterior or anterior cervical chain or axillae bilaterally.  Gait normal with good balance and coordination.  MSK:  Non tender with full range of motion and good stability and symmetric strength and tone of shoulders, elbows, wrist, hip, knee and ankles bilaterally.  Neck: Inspection mild loss of lordosis. No palpable stepoffs. Negative Spurling's maneuver. Mild limitation in sidebending bilaterally Grip strength and sensation normal in bilateral hands Strength  good C4 to T1 distribution No sensory change to C4 to T1 Negative Hoffman sign bilaterally Reflexes normal Mild tightness in the right trapezius  Osteopathic findings C2 flexed rotated and side bent right C4 flexed rotated and side bent left C7 flexed rotated and side bent left T3 extended rotated and side bent right inhaled third rib T6 extended rotated and side bent left L3 flexed rotated and side bent right Sacrum right on right     Impression and Recommendations:     This case required medical decision making of moderate complexity.      Note: This dictation was prepared with Dragon dictation along with smaller phrase technology. Any transcriptional errors that result from this process are unintentional.

## 2017-04-03 ENCOUNTER — Ambulatory Visit (INDEPENDENT_AMBULATORY_CARE_PROVIDER_SITE_OTHER): Payer: 59 | Admitting: Family Medicine

## 2017-04-03 ENCOUNTER — Encounter: Payer: Self-pay | Admitting: Family Medicine

## 2017-04-03 VITALS — BP 112/88 | Ht 65.0 in | Wt 194.0 lb

## 2017-04-03 DIAGNOSIS — M999 Biomechanical lesion, unspecified: Secondary | ICD-10-CM | POA: Diagnosis not present

## 2017-04-03 DIAGNOSIS — M546 Pain in thoracic spine: Secondary | ICD-10-CM | POA: Diagnosis not present

## 2017-04-03 DIAGNOSIS — G8929 Other chronic pain: Secondary | ICD-10-CM | POA: Diagnosis not present

## 2017-04-03 MED FILL — ZOLPIDEM TARTRATE 10 MG TAB: 10 | 30 days supply | Qty: 30 | Fill #0

## 2017-04-03 MED FILL — PHENTERMINE 37.5 MG TABLET: 37.5 | 30 days supply | Qty: 30 | Fill #2

## 2017-04-03 NOTE — Patient Instructions (Signed)
good to see you  I am here [phone]

## 2017-04-03 NOTE — Assessment & Plan Note (Signed)
Decision today to treat with OMT was based on Physical Exam  After verbal consent patient was treated with HVLA, ME, FPR techniques in cervical, thoracic, rib, lumbar and sacral areas  Patient tolerated the procedure well with improvement in symptoms  Patient given exercises, stretches and lifestyle modifications  See medications in patient instructions if given  Patient will follow up in 4-6 weeks 

## 2017-04-03 NOTE — Assessment & Plan Note (Signed)
Much better overall.  Continues to have somewhat of a slipped rib.  We discussed the patient continues to have a nystagmus with patient's history of multiple sclerosis we may want to consider potentially further workup including an MRI.  Patient has declined it at this time.  We will continue to monitor.  Patient will call if any worsening symptoms.  Follow-up with me every 3 months at least O for manipulation.

## 2017-04-11 MED FILL — AZITHROMYCIN 250 MG TABLET: 250 | 5 days supply | Qty: 6 | Fill #0

## 2017-05-07 DIAGNOSIS — N96 Recurrent pregnancy loss: Secondary | ICD-10-CM | POA: Diagnosis not present

## 2017-05-14 MED FILL — valACYclovir HCL 1 GM TABS: 1 | 10 days supply | Qty: 20 | Fill #0

## 2017-05-20 DIAGNOSIS — Z3200 Encounter for pregnancy test, result unknown: Secondary | ICD-10-CM | POA: Diagnosis not present

## 2017-05-20 MED FILL — BD INSULIN SYR 1 ML 6MMX31G: 31G X 15/64 | 30 days supply | Qty: 60 | Fill #0

## 2017-05-21 MED FILL — PHENTERMINE 37.5 MG TABLET: 37.5 | 30 days supply | Qty: 30 | Fill #3

## 2017-05-21 MED FILL — HEPARIN SODIUM (PORCINE) 50: 5000 | 30 days supply | Qty: 60 | Fill #0

## 2017-05-24 DIAGNOSIS — Z3200 Encounter for pregnancy test, result unknown: Secondary | ICD-10-CM | POA: Diagnosis not present

## 2017-05-24 MED FILL — metFORMIN HCL 1000 MG TABS: 1000 | 30 days supply | Qty: 60 | Fill #0

## 2017-06-05 MED FILL — ONDANSETRON ODT 4 MG TABLET: 4 | 20 days supply | Qty: 60 | Fill #1

## 2017-06-07 DIAGNOSIS — O021 Missed abortion: Secondary | ICD-10-CM | POA: Diagnosis not present

## 2017-06-11 MED FILL — PROMETHAZINE 12.5 MG TABLET: 12.5 | 2 days supply | Qty: 10 | Fill #0

## 2017-06-11 MED FILL — traMADol HCL 50 MG TABS: 50 | 2 days supply | Qty: 6 | Fill #0

## 2017-06-12 DIAGNOSIS — N96 Recurrent pregnancy loss: Secondary | ICD-10-CM | POA: Diagnosis not present

## 2017-06-12 DIAGNOSIS — Z3200 Encounter for pregnancy test, result unknown: Secondary | ICD-10-CM | POA: Diagnosis not present

## 2017-06-12 DIAGNOSIS — O021 Missed abortion: Secondary | ICD-10-CM | POA: Diagnosis not present

## 2017-07-05 MED FILL — ZOLPIDEM TARTRATE 10 MG TAB: 10 | 30 days supply | Qty: 30 | Fill #1

## 2017-07-05 MED FILL — metFORMIN HCL 1000 MG TABS: 1000 | 30 days supply | Qty: 60 | Fill #1

## 2017-07-09 MED FILL — SAXENDA 18 MG/3 ML PEN: 18 | 30 days supply | Qty: 15 | Fill #4

## 2017-07-21 NOTE — Progress Notes (Signed)
[name] [address] [place], [place] [address] Phone: [phone] Subjective:    I'm seeing this patient by the request  of:    CC: Neck and back pain  WEX:[id number]  Jaselle Pryer is a 37 y.o. female coming in with complaint of neck and back pain.  Patient has had some difficulty for quite some time.  Patient also has a history of multiple sclerosis.  Some increasing stress is causing some tightness again.  Has been doing relatively well overall with the home exercises but of doing them very intermittently.  Has had some increasing stress over the course of time. Has had 3 miscarriages over the course of time    Past Medical History:  Diagnosis Date  . Asthma   . MS (multiple sclerosis) ([place])   . PCOS (polycystic ovarian syndrome)   . Raynaud's disease    Past Surgical History:  Procedure Laterality Date  . TONSILECTOMY, ADENOIDECTOMY, BILATERAL MYRINGOTOMY AND TUBES  1986   Social History   Socioeconomic History  . Marital status: Married    Spouse name: Not on file  . Number of children: 0  . Years of education: Not on file  . Highest education level: Not on file  Occupational History  . Occupation: physicain  Social Needs  . Financial resource strain: Not on file  . Food insecurity:    Worry: Not on file    Inability: Not on file  . Transportation needs:    Medical: Not on file    Non-medical: Not on file  Tobacco Use  . Smoking status: Never Smoker  . Smokeless tobacco: Never Used  Substance and Sexual Activity  . Alcohol use: No  . Drug use: No  . Sexual activity: Not on file  Lifestyle  . Physical activity:    Days per week: Not on file    Minutes per session: Not on file  . Stress: Not on file  Relationships  . Social connections:    Talks on phone: Not on file    Gets together: Not on file    Attends religious service: Not on file    Active member of club or organization: Not on file    Attends meetings of  clubs or organizations: Not on file    Relationship status: Not on file  Other Topics Concern  . Not on file  Social History Narrative   Right handed   1-2 cups caffeine daily   Lives at home with husband   No Known Allergies Family History  Problem Relation Age of Onset  . Diabetes Mother   . Heart disease Mother   . Thyroid disease Mother   . Hypertension Mother   . Hypercholesterolemia Mother   . Migraines Mother   . Diabetes Father   . Thyroid disease Father   . Hyperlipidemia Father   . CAD Father   . Hypercholesterolemia Father   . Hypertension Father   . Breast cancer Paternal Aunt      Past medical history, social, surgical and family history all reviewed in electronic medical record.  No pertanent information unless stated regarding to the chief complaint.   Review of Systems:Review of systems updated and as accurate as of 07/22/17  No headache, visual changes, nausea, vomiting, diarrhea, constipation, dizziness, abdominal pain, skin rash, fevers, chills, night sweats, weight loss, swollen lymph nodes, body aches, joint swelling, muscle aches, chest pain, shortness of breath, mood changes.   Objective  Blood pressure 126/70,  pulse 83, height 5\' 5"  (1.651 m), weight 196 lb (88.9 kg), SpO2 98 %. Systems examined below as of 07/22/17   General: No apparent distress alert and oriented x3 mood and affect normal, dressed appropriately.  HEENT: Pupils equal, extraocular movements intact  Respiratory: Patient's speak in full sentences and does not appear short of breath  Cardiovascular: No lower extremity edema, non tender, no erythema  Skin: Warm dry intact with no signs of infection or rash on extremities or on axial skeleton.  Abdomen: Soft nontender  Neuro: Cranial nerves II through XII are intact, neurovascularly intact in all extremities with 2+ DTRs and 2+ pulses.  Lymph: No lymphadenopathy of posterior or anterior cervical chain or axillae bilaterally.  Gait  normal with good balance and coordination.  MSK:  Non tender with full range of motion and good stability and symmetric strength and tone of shoulders, elbows, wrist, hip, knee and ankles bilaterally.  Neck: Inspection loss of lordosis. No palpable stepoffs. Negative Spurling's maneuver. Full neck range of motion Grip strength and sensation normal in bilateral hands Strength good C4 to T1 distribution No sensory change to C4 to T1 Negative Hoffman sign bilaterally Reflexes normal Tightness of the trapezius bilaterally  Back Exam:  Inspection: Unremarkable  Motion: Flexion 45 deg, Extension 25 deg, Side Bending to 45 deg bilaterally,  Rotation to 45 deg bilaterally  SLR laying: Negative  XSLR laying: Negative  Palpable tenderness: Tender to palpation of the paraspinal musculature lumbar spine right greater than left. FABER: Tightness bilaterally. Sensory change: Gross sensation intact to all lumbar and sacral dermatomes.  Reflexes: 2+ at both patellar tendons, 2+ at achilles tendons, Babinski's downgoing.  Strength at foot  Plantar-flexion: 5/5 Dorsi-flexion: 5/5 Eversion: 5/5 Inversion: 5/5  Leg strength  Quad: 5/5 Hamstring: 5/5 Hip flexor: 5/5 Hip abductors: 4/5 but symmetric Gait unremarkable.  Osteopathic findings C2 flexed rotated and side bent right C4 flexed rotated and side bent left C6 flexed rotated and side bent left T3 extended rotated and side bent right inhaled third rib T9 extended rotated and side bent left L2 flexed rotated and side bent right Sacrum right on right    Impression and Recommendations:     This case required medical decision making of moderate complexity.      Note: This dictation was prepared with Dragon dictation along with smaller phrase technology. Any transcriptional errors that result from this process are unintentional.

## 2017-07-22 ENCOUNTER — Encounter: Payer: Self-pay | Admitting: Family Medicine

## 2017-07-22 ENCOUNTER — Ambulatory Visit (INDEPENDENT_AMBULATORY_CARE_PROVIDER_SITE_OTHER): Payer: 59 | Admitting: Family Medicine

## 2017-07-22 VITALS — BP 126/70 | HR 83 | Ht 65.0 in | Wt 196.0 lb

## 2017-07-22 DIAGNOSIS — G8929 Other chronic pain: Secondary | ICD-10-CM

## 2017-07-22 DIAGNOSIS — M999 Biomechanical lesion, unspecified: Secondary | ICD-10-CM | POA: Diagnosis not present

## 2017-07-22 DIAGNOSIS — M546 Pain in thoracic spine: Secondary | ICD-10-CM | POA: Diagnosis not present

## 2017-07-22 NOTE — Assessment & Plan Note (Addendum)
Decision today to treat with OMT was based on Physical Exam  After verbal consent patient was treated with HVLA, ME, FPR techniques in cervical, thoracic, rib, lumbar and sacral areas  Patient tolerated the procedure well with improvement in symptoms  Patient given exercises, stretches and lifestyle modifications  See medications in patient instructions if given  Patient will follow up in 8-12 weeks 

## 2017-07-22 NOTE — Assessment & Plan Note (Signed)
Stable overall.  Continues to have tightness.  I do believe it is multifactorial with patient's large breast tissue, muscle imbalances, as well as stress relation.  Discussed again about posture and ergonomics.  Discussed the importance of a proper fitting bra.  Follow-up again in 3 months

## 2017-07-24 DIAGNOSIS — Z0000 Encounter for general adult medical examination without abnormal findings: Secondary | ICD-10-CM | POA: Diagnosis not present

## 2017-07-24 DIAGNOSIS — G4700 Insomnia, unspecified: Secondary | ICD-10-CM | POA: Diagnosis not present

## 2017-07-24 DIAGNOSIS — Z6832 Body mass index (BMI) 32.0-32.9, adult: Secondary | ICD-10-CM | POA: Diagnosis not present

## 2017-07-24 DIAGNOSIS — N943 Premenstrual tension syndrome: Secondary | ICD-10-CM | POA: Diagnosis not present

## 2017-07-24 DIAGNOSIS — E669 Obesity, unspecified: Secondary | ICD-10-CM | POA: Diagnosis not present

## 2017-07-24 MED FILL — UNIFINE PENTIPS 31GX3/16": 31G X 5 MM | 90 days supply | Qty: 100 | Fill #0

## 2017-07-24 MED FILL — PHENTERMINE 37.5 MG TABLET: 37.5 | 30 days supply | Qty: 30 | Fill #0

## 2017-07-24 MED FILL — PROGESTERONE 100 MG CAPSULE: 100 | 30 days supply | Qty: 60 | Fill #0

## 2017-07-24 MED FILL — UNIFINE PENTIPS 31GX3/16: 31G X 5 MM | 90 days supply | Qty: 100 | Fill #0

## 2017-08-12 MED FILL — SAXENDA 18 MG/3 ML PEN: 18 | 30 days supply | Qty: 15 | Fill #5

## 2017-08-12 MED FILL — metFORMIN HCL 1000 MG TABS: 1000 | 30 days supply | Qty: 60 | Fill #2

## 2017-08-12 MED FILL — ESOMEPRAZOLE MAG DR 40 MG C: 40 | 90 days supply | Qty: 90 | Fill #1

## 2017-08-12 MED FILL — ZOLPIDEM TARTRATE 10 MG TAB: 10 | 30 days supply | Qty: 30 | Fill #2

## 2017-09-10 MED FILL — SAXENDA 18 MG/3 ML PEN: 18 | 30 days supply | Qty: 15 | Fill #0

## 2017-10-31 MED FILL — SAXENDA 18 MG/3 ML PEN: 18 | 30 days supply | Qty: 15 | Fill #1

## 2017-10-31 MED FILL — metFORMIN HCL 1000 MG TABS: 1000 | 30 days supply | Qty: 60 | Fill #3

## 2017-10-31 MED FILL — ZOLPIDEM TARTRATE 10 MG TAB: 10 | 30 days supply | Qty: 30 | Fill #0

## 2017-12-02 MED FILL — SAXENDA 18 MG/3 ML PEN: 18 | 30 days supply | Qty: 15 | Fill #2

## 2017-12-02 MED FILL — ZOLPIDEM TARTRATE 10 MG TAB: 10 | 30 days supply | Qty: 30 | Fill #1

## 2017-12-02 MED FILL — metFORMIN HCL 1000 MG TABS: 1000 | 30 days supply | Qty: 60 | Fill #4

## 2017-12-17 MED FILL — SF 5000 PLUS CREAM: 1.1 | 30 days supply | Qty: 51 | Fill #0

## 2018-01-08 MED FILL — metFORMIN HCL 1000 MG TABS: 1000 | 30 days supply | Qty: 60 | Fill #5

## 2018-01-08 MED FILL — ZOLPIDEM TARTRATE 10 MG TAB: 10 | 30 days supply | Qty: 30 | Fill #2

## 2018-01-08 MED FILL — SAXENDA 18 MG/3 ML PEN: 18 | 30 days supply | Qty: 15 | Fill #3

## 2018-01-08 MED FILL — PROGESTERONE 100 MG CAPSULE: 100 | 30 days supply | Qty: 60 | Fill #1

## 2018-01-10 MED FILL — HEPARIN SODIUM (PORCINE) 50: 5000 | 30 days supply | Qty: 60 | Fill #1

## 2018-01-10 MED FILL — BD INSULIN SYR 1 ML 6MMX31G: 31G X 15/64 | 30 days supply | Qty: 60 | Fill #1

## 2018-01-13 DIAGNOSIS — Z3200 Encounter for pregnancy test, result unknown: Secondary | ICD-10-CM | POA: Diagnosis not present

## 2018-01-13 DIAGNOSIS — O2621 Pregnancy care for patient with recurrent pregnancy loss, first trimester: Secondary | ICD-10-CM | POA: Diagnosis not present

## 2018-01-14 MED FILL — PROGESTERONE MICRONIZED 200: 200 | 30 days supply | Qty: 30 | Fill #0

## 2018-01-21 DIAGNOSIS — Z3200 Encounter for pregnancy test, result unknown: Secondary | ICD-10-CM | POA: Diagnosis not present

## 2018-01-29 DIAGNOSIS — O021 Missed abortion: Secondary | ICD-10-CM | POA: Diagnosis not present

## 2018-01-29 DIAGNOSIS — O2621 Pregnancy care for patient with recurrent pregnancy loss, first trimester: Secondary | ICD-10-CM | POA: Diagnosis not present

## 2018-01-29 MED FILL — traMADol HCL 50 MG TABS: 50 | 2 days supply | Qty: 6 | Fill #0

## 2018-01-29 MED FILL — PROMETHAZINE 12.5 MG TABLET: 12.5 | 2 days supply | Qty: 8 | Fill #0

## 2018-01-31 DIAGNOSIS — O2621 Pregnancy care for patient with recurrent pregnancy loss, first trimester: Secondary | ICD-10-CM | POA: Diagnosis not present

## 2018-01-31 DIAGNOSIS — O021 Missed abortion: Secondary | ICD-10-CM | POA: Diagnosis not present

## 2018-01-31 DIAGNOSIS — Z3200 Encounter for pregnancy test, result unknown: Secondary | ICD-10-CM | POA: Diagnosis not present

## 2018-02-10 DIAGNOSIS — N96 Recurrent pregnancy loss: Secondary | ICD-10-CM | POA: Diagnosis not present

## 2018-02-11 MED FILL — BD NEEDLES 22GX1.5": 22G X 1-1/2 | 30 days supply | Qty: 30 | Fill #0

## 2018-02-11 MED FILL — BD 3 ML SYRINGE 18GX1-1/2: 18G X 1-1/2 | 30 days supply | Qty: 60 | Fill #0

## 2018-02-11 MED FILL — ESTRADIOL 0.1 MG PATCH: 0.1 | 28 days supply | Qty: 8 | Fill #0

## 2018-02-11 MED FILL — PROGESTERONE OIL 50 MG/ML V: 50 | 30 days supply | Qty: 30 | Fill #0

## 2018-02-11 MED FILL — GONAL-F RFF REDI-JECT 900 U: 900 | 4 days supply | Qty: 6 | Fill #0

## 2018-02-11 MED FILL — MENOPUR 75 UNIT VIAL: 75 | 10 days supply | Qty: 10 | Fill #0

## 2018-02-11 MED FILL — BD NEEDLES 30GX0.5": 30G X 1/2" | 30 days supply | Qty: 30 | Fill #0

## 2018-02-11 MED FILL — BD NEEDLES 22GX1.5: 22G X 1-1/2 | 30 days supply | Qty: 30 | Fill #0

## 2018-02-11 MED FILL — BD 3 ML SYRINGE 18GX1-1/2": 18G X 1-1/2 | 30 days supply | Qty: 60 | Fill #0

## 2018-02-11 MED FILL — METHYLPREDNISOLONE 4 MG TAB: 4 | 4 days supply | Qty: 16 | Fill #0

## 2018-02-11 MED FILL — DOXYCYCLINE HYCLATE 100 MG: 100 | 20 days supply | Qty: 40 | Fill #0

## 2018-02-11 MED FILL — ESTRADIOL 2 MG TABLET: 2 | 30 days supply | Qty: 60 | Fill #0

## 2018-02-11 MED FILL — NOVAREL 5000 UNIT SOLR: 5000 | 1 days supply | Qty: 2 | Fill #0

## 2018-02-11 MED FILL — BD NEEDLES 30GX0.5: 30G X 1/2" | 30 days supply | Qty: 30 | Fill #0

## 2018-02-12 MED FILL — metFORMIN HCL 1000 MG TABS: 1000 | 30 days supply | Qty: 60 | Fill #6

## 2018-02-12 MED FILL — CETROTIDE 0.25 MG KIT: 0.25 | 6 days supply | Qty: 6 | Fill #0

## 2018-02-12 MED FILL — ESOMEPRAZOLE MAG DR 40 MG C: 40 | 90 days supply | Qty: 90 | Fill #3

## 2018-02-12 MED FILL — ZOLPIDEM TARTRATE 10 MG TAB: 10 | 30 days supply | Qty: 30 | Fill #0

## 2018-02-12 MED FILL — SAXENDA 18 MG/3 ML PEN: 18 | 30 days supply | Qty: 15 | Fill #4

## 2018-02-19 ENCOUNTER — Ambulatory Visit (INDEPENDENT_AMBULATORY_CARE_PROVIDER_SITE_OTHER): Payer: 59 | Admitting: Family Medicine

## 2018-02-19 ENCOUNTER — Encounter: Payer: Self-pay | Admitting: Family Medicine

## 2018-02-19 VITALS — BP 92/62 | HR 72 | Ht 65.0 in | Wt 198.0 lb

## 2018-02-19 DIAGNOSIS — G8929 Other chronic pain: Secondary | ICD-10-CM

## 2018-02-19 DIAGNOSIS — M999 Biomechanical lesion, unspecified: Secondary | ICD-10-CM

## 2018-02-19 DIAGNOSIS — M546 Pain in thoracic spine: Secondary | ICD-10-CM

## 2018-02-19 NOTE — Progress Notes (Signed)
[name] [place], [place] [address] Phone: [phone] Subjective:   Martha [name], am serving as a scribe for Dr. Hulan Saas.   CC: Back and neck pain follow-up  HER:[id number]  Martha [name] is a 37 y.o. female coming in with complaint of cervical spine and lower back pain. [name] change in her pain since last visit. Patient is here for OMT to manage her upper and lower back pain.  Had responded well to conservative therapy.  Unfortunately has not been working out regularly with patient having multiple miscarriages.  Just has not felt like doing that on a regular basis.  Denies numbness or tingling overall though in any of the extremities.  [name] weakness noted.  Does not feel that she has had any difficulty with the multiple sclerosis at the moment.     Past Medical History:  Diagnosis Date  . Asthma   . MS (multiple sclerosis) ([place])   . PCOS (polycystic ovarian syndrome)   . Raynaud's disease    Past Surgical History:  Procedure Laterality Date  . TONSILECTOMY, ADENOIDECTOMY, BILATERAL MYRINGOTOMY AND TUBES  1986   Social History   Socioeconomic History  . Marital status: Married    Spouse name: Not on file  . Number of children: 0  . Years of education: Not on file  . Highest education level: Not on file  Occupational History  . Occupation: physicain  Social Needs  . Financial resource strain: Not on file  . Food insecurity:    Worry: Not on file    Inability: Not on file  . Transportation needs:    Medical: Not on file    Non-medical: Not on file  Tobacco Use  . Smoking status: Never Smoker  . Smokeless tobacco: Never Used  Substance and Sexual Activity  . Alcohol use: [name]  . Drug use: [name]  . Sexual activity: Not on file  Lifestyle  . Physical activity:    Days per week: Not on file    Minutes per session: Not on file  . Stress: Not on file  Relationships  . Social connections:    Talks on phone: Not on file      Gets together: Not on file    Attends religious service: Not on file    Active member of club or organization: Not on file    Attends meetings of clubs or organizations: Not on file    Relationship status: Not on file  Other Topics Concern  . Not on file  Social History Narrative   Right handed   1-2 cups caffeine daily   Lives at home with husband   [name] Known Allergies Family History  Problem Relation Age of Onset  . Diabetes Mother   . Heart disease Mother   . Thyroid disease Mother   . Hypertension Mother   . Hypercholesterolemia Mother   . Migraines Mother   . Diabetes Father   . Thyroid disease Father   . Hyperlipidemia Father   . CAD Father   . Hypercholesterolemia Father   . Hypertension Father   . Breast cancer Paternal Aunt     Current Outpatient Medications (Endocrine & Metabolic):  .  metFORMIN (GLUCOPHAGE) 500 MG tablet, Take 1 tablet (500 mg total) by mouth daily with breakfast. .  methylPREDNISolone (MEDROL DOSEPAK) 4 MG TBPK tablet, follow package directions   Current Outpatient Medications (Respiratory):  .  loratadine (CLARITIN) 10 MG tablet, Take 10 mg by mouth  daily as needed for allergies.  Current Outpatient Medications (Analgesics):  .  rizatriptan (MAXALT) 10 MG tablet, Take 1 tablet (10 mg total) by mouth as needed for migraine. May repeat in 2 hours if needed   Current Outpatient Medications (Other):  .  esomeprazole (NEXIUM) 40 MG capsule, TAKE 1 CAPSULE BY MOUTH ONCE DAILY .  ondansetron (ZOFRAN ODT) 4 MG disintegrating tablet, Take 1 tablet (4 mg total) by mouth every 8 (eight) hours as needed for nausea or vomiting. Marland Kitchen  zolpidem (AMBIEN) 10 MG tablet, Take 5 mg as needed by mouth.     Past medical history, social, surgical and family history all reviewed in electronic medical record.  [name] pertanent information unless stated regarding to the chief complaint.   Review of Systems:  [name] headache, visual changes, nausea, vomiting, diarrhea,  constipation, dizziness, abdominal pain, skin rash, fevers, chills, night sweats, weight loss, swollen lymph nodes, body aches, joint swelling,  chest pain, shortness of breath, mood changes.  Positive muscle aches  Objective  Blood pressure 92/62, pulse 72, height 5\' 5"  (1.651 m), weight 198 lb (89.8 kg), SpO2 99 %.    General: [name] apparent distress alert and oriented x3 mood and affect normal, dressed appropriately.  HEENT: Pupils equal, extraocular movements intact  Respiratory: Patient's speak in full sentences and does not appear short of breath  Cardiovascular: [name] lower extremity edema, non tender, [name] erythema  Skin: Warm dry intact with [name] signs of infection or rash on extremities or on axial skeleton.  Abdomen: Soft nontender  Neuro: Cranial nerves II through XII are intact, neurovascularly intact in all extremities with 2+ DTRs and 2+ pulses.  Lymph: [name] lymphadenopathy of posterior or anterior cervical chain or axillae bilaterally.  Gait normal with good balance and coordination.  MSK:  Non tender with full range of motion and good stability and symmetric strength and tone of shoulders, elbows, wrist, hip, knee and ankles bilaterally.  Back Exam:  Inspection: Mild loss of lordosis Motion: Flexion 45 deg, Extension 35 deg, Side Bending to 45 deg bilaterally,  Rotation to 45 deg bilaterally  SLR laying: Negative  XSLR laying: Negative  Palpable tenderness: Tender to palpation the paraspinal musculature. FABER: Tightness bilaterally. Sensory change: Gross sensation intact to all lumbar and sacral dermatomes.  Reflexes: 2+ at both patellar tendons, 2+ at achilles tendons, Babinski's downgoing.  Strength at foot  Plantar-flexion: 5/5 Dorsi-flexion: 5/5 Eversion: 5/5 Inversion: 5/5  Leg strength  Quad: 5/5 Hamstring: 5/5 Hip flexor: 5/5 Hip abductors: 5/5  Gait unremarkable.  Osteopathic findings C2 flexed rotated and side bent right C4 flexed rotated and side bent left T3  extended rotated and side bent right inhaled third rib T9 extended rotated and side bent left L2 flexed rotated and side bent right Sacrum right on right    Impression and Recommendations:     This case required medical decision making of moderate complexity. The above documentation has been reviewed and is accurate and complete Lyndal Pulley, DO       Note: This dictation was prepared with Dragon dictation along with smaller phrase technology. Any transcriptional errors that result from this process are unintentional.

## 2018-02-19 NOTE — Patient Instructions (Signed)
Thanks for making my xmas! You know the drill  Have a great holiday and maybe see you again in 2-3 months Happy New Year!

## 2018-02-19 NOTE — Assessment & Plan Note (Signed)
Postural mostly.  We discussed ergonomics throughout the day.  No icing regimen, home exercise, which activities to do which wants to avoid.  Patient is to increase activity slowly over the course the next several days.  Patient does respond well to manipulation.  Follow-up again in 4 to 12 weeks

## 2018-02-19 NOTE — Assessment & Plan Note (Signed)
Decision today to treat with OMT was based on Physical Exam  After verbal consent patient was treated with HVLA, ME, FPR techniques in  Thoracic, rib, lumbar and sacral areas  Patient tolerated the procedure well with improvement in symptoms  Patient given exercises, stretches and lifestyle modifications  See medications in patient instructions if given  Patient will follow up in 4-8 weeks

## 2018-02-20 DIAGNOSIS — Z3183 Encounter for assisted reproductive fertility procedure cycle: Secondary | ICD-10-CM | POA: Diagnosis not present

## 2018-02-21 DIAGNOSIS — G4700 Insomnia, unspecified: Secondary | ICD-10-CM | POA: Diagnosis not present

## 2018-02-21 DIAGNOSIS — E669 Obesity, unspecified: Secondary | ICD-10-CM | POA: Diagnosis not present

## 2018-02-21 DIAGNOSIS — G35 Multiple sclerosis: Secondary | ICD-10-CM | POA: Diagnosis not present

## 2018-02-21 DIAGNOSIS — Z6832 Body mass index (BMI) 32.0-32.9, adult: Secondary | ICD-10-CM | POA: Diagnosis not present

## 2018-02-21 DIAGNOSIS — N979 Female infertility, unspecified: Secondary | ICD-10-CM | POA: Diagnosis not present

## 2018-03-07 DIAGNOSIS — O2621 Pregnancy care for patient with recurrent pregnancy loss, first trimester: Secondary | ICD-10-CM | POA: Diagnosis not present

## 2018-03-07 DIAGNOSIS — Z3183 Encounter for assisted reproductive fertility procedure cycle: Secondary | ICD-10-CM | POA: Diagnosis not present

## 2018-03-10 DIAGNOSIS — Z3183 Encounter for assisted reproductive fertility procedure cycle: Secondary | ICD-10-CM | POA: Diagnosis not present

## 2018-03-10 DIAGNOSIS — O2621 Pregnancy care for patient with recurrent pregnancy loss, first trimester: Secondary | ICD-10-CM | POA: Diagnosis not present

## 2018-03-13 DIAGNOSIS — Z3183 Encounter for assisted reproductive fertility procedure cycle: Secondary | ICD-10-CM | POA: Diagnosis not present

## 2018-03-13 DIAGNOSIS — O2621 Pregnancy care for patient with recurrent pregnancy loss, first trimester: Secondary | ICD-10-CM | POA: Diagnosis not present

## 2018-03-13 DIAGNOSIS — Z113 Encounter for screening for infections with a predominantly sexual mode of transmission: Secondary | ICD-10-CM | POA: Diagnosis not present

## 2018-03-17 DIAGNOSIS — Z3183 Encounter for assisted reproductive fertility procedure cycle: Secondary | ICD-10-CM | POA: Diagnosis not present

## 2018-03-17 DIAGNOSIS — O2621 Pregnancy care for patient with recurrent pregnancy loss, first trimester: Secondary | ICD-10-CM | POA: Diagnosis not present

## 2018-03-19 DIAGNOSIS — O2621 Pregnancy care for patient with recurrent pregnancy loss, first trimester: Secondary | ICD-10-CM | POA: Diagnosis not present

## 2018-03-19 DIAGNOSIS — Z3183 Encounter for assisted reproductive fertility procedure cycle: Secondary | ICD-10-CM | POA: Diagnosis not present

## 2018-03-19 MED FILL — PROMETHAZINE 12.5 MG TABLET: 12.5 | 3 days supply | Qty: 10 | Fill #0

## 2018-03-19 MED FILL — OXYCODONE-ACETAMINOPHEN 5-3: 5-325 | 3 days supply | Qty: 10 | Fill #0

## 2018-03-21 DIAGNOSIS — N840 Polyp of corpus uteri: Secondary | ICD-10-CM | POA: Diagnosis not present

## 2018-03-21 DIAGNOSIS — Z3183 Encounter for assisted reproductive fertility procedure cycle: Secondary | ICD-10-CM | POA: Diagnosis not present

## 2018-03-21 DIAGNOSIS — N96 Recurrent pregnancy loss: Secondary | ICD-10-CM | POA: Diagnosis not present

## 2018-03-21 DIAGNOSIS — Z3141 Encounter for fertility testing: Secondary | ICD-10-CM | POA: Diagnosis not present

## 2018-03-21 DIAGNOSIS — N8500 Endometrial hyperplasia, unspecified: Secondary | ICD-10-CM | POA: Diagnosis not present

## 2018-03-26 DIAGNOSIS — Z3183 Encounter for assisted reproductive fertility procedure cycle: Secondary | ICD-10-CM | POA: Diagnosis not present

## 2018-03-27 DIAGNOSIS — Z3183 Encounter for assisted reproductive fertility procedure cycle: Secondary | ICD-10-CM | POA: Diagnosis not present

## 2018-03-28 DIAGNOSIS — Z3183 Encounter for assisted reproductive fertility procedure cycle: Secondary | ICD-10-CM | POA: Diagnosis not present

## 2018-04-03 MED FILL — ESTRADIOL 0.1 MG PATCH: 0.1 | 28 days supply | Qty: 8 | Fill #1

## 2018-04-03 MED FILL — ZOLPIDEM TARTRATE 10 MG TAB: 10 | 30 days supply | Qty: 30 | Fill #1

## 2018-04-09 ENCOUNTER — Ambulatory Visit (INDEPENDENT_AMBULATORY_CARE_PROVIDER_SITE_OTHER): Payer: 59

## 2018-04-09 ENCOUNTER — Ambulatory Visit (INDEPENDENT_AMBULATORY_CARE_PROVIDER_SITE_OTHER): Payer: 59 | Admitting: Orthopaedic Surgery

## 2018-04-09 ENCOUNTER — Encounter (INDEPENDENT_AMBULATORY_CARE_PROVIDER_SITE_OTHER): Payer: Self-pay | Admitting: Orthopaedic Surgery

## 2018-04-09 DIAGNOSIS — M25561 Pain in right knee: Secondary | ICD-10-CM | POA: Diagnosis not present

## 2018-04-09 MED ORDER — MELOXICAM 7.5 MG PO TABS: 15.0000 mg | ORAL_TABLET | Freq: Every day | ORAL | 2 refills | Status: DC | PRN

## 2018-04-09 MED FILL — MELOXICAM 7.5 MG TABLET: 7.5 | 15 days supply | Qty: 30 | Fill #0

## 2018-04-09 NOTE — Progress Notes (Signed)
Office Visit Note   Patient: Martha [name]           Date of Birth: 05/15/1980           MRN: [id number] Visit Date: 04/09/2018              Requested by: No referring provider defined for this encounter. PCP: Patient, No Pcp Per   Assessment & Plan: Visit Diagnoses:  1. Acute pain of right knee     Plan: Impression is right knee pain with effusion likely reactive synovitis or arthritis.  I offered aspiration and cortisone injection but patient politely declined.  We will send in prescription for meloxicam.  I think overall she has overdone it with her knee and needs to rest and ice and elevate.  She has an over-the-counter knee brace which she will use.  She also has Pennsaid gel at home.  Together we agreed that the risk for DVT is low but a Doppler was offered today.  Patient will notify us if she does not improve in a week.  Follow-Up Instructions: Return if symptoms worsen or fail to improve.   Orders:  Orders Placed This Encounter  Procedures  . XR KNEE 3 VIEW RIGHT   Meds ordered this encounter  Medications  . DISCONTD: meloxicam (MOBIC) 7.5 MG tablet    Sig: Take 2 tablets (15 mg total) by mouth daily as needed for pain.    Dispense:  30 tablet    Refill:  2  . meloxicam (MOBIC) 7.5 MG tablet    Sig: Take 2 tablets (15 mg total) by mouth daily as needed for pain.    Dispense:  30 tablet    Refill:  2      Procedures: No procedures performed   Clinical Data: No additional findings.   Subjective: Chief Complaint  Patient presents with  . 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  She denies any injuries.  She has recently gained 10 to 15 pounds due to hormone therapy for IVF.  She has been very active and has been on her feet quite a bit.  She now has had some constant pain which also radiates to the posterior aspect.  She denies any calf tenderness and she  denies any swelling of the calf or leg.  She feels like the right knee is slightly swollen.  She denies any mechanical symptoms.   Review of Systems  Constitutional: Negative.   HENT: Negative.   Eyes: Negative.   Respiratory: Negative.   Cardiovascular: Negative.   Endocrine: Negative.   Musculoskeletal: Negative.   Neurological: Negative.   Hematological: Negative.   Psychiatric/Behavioral: Negative.   All other systems reviewed and are negative.    Objective: Vital Signs: There were no vitals taken for this visit.  Physical Exam Vitals signs and nursing note reviewed.  Constitutional:      Appearance: She is well-developed.  Pulmonary:     Effort: Pulmonary effort is normal.  Skin:    General: Skin is warm.     Capillary Refill: Capillary refill takes less than 2 seconds.  Neurological:     Mental Status: She is alert and oriented to person, place, and time.  Psychiatric:        Behavior: Behavior normal.        Thought Content: Thought content normal.  Judgment: Judgment normal.     Ortho Exam Right knee exam shows a trace joint effusion.  Collaterals and cruciates are stable.  Normal range of motion.  She does have a palpable Baker's cyst that is tender.  No joint line tenderness.  Patella mobility is symmetric.  Negative patellar apprehension. Specialty Comments:  No specialty comments available.  Imaging: Xr Knee 3 View Right  Result Date: 04/09/2018 No acute or structural abnormalities    PMFS History: Patient Active Problem List   Diagnosis Date Noted  . Migraine without aura and with status migrainosus, not intractable 01/10/2017  . GERD (gastroesophageal reflux disease) 02/25/2015  . Thoracic back pain 10/21/2014  . Nonallopathic lesion of thoracic region 10/21/2014  . Nonallopathic lesion of lumbosacral region 10/21/2014  . Nonallopathic lesion-rib cage 10/21/2014   Past Medical History:  Diagnosis Date  . Asthma   . MS (multiple  sclerosis) ([place])   . PCOS (polycystic ovarian syndrome)   . Raynaud's disease     Family History  Problem Relation Age of Onset  . Diabetes Mother   . Heart disease Mother   . Thyroid disease Mother   . Hypertension Mother   . Hypercholesterolemia Mother   . Migraines Mother   . Diabetes Father   . Thyroid disease Father   . Hyperlipidemia Father   . CAD Father   . Hypercholesterolemia Father   . Hypertension Father   . Breast cancer Paternal Aunt     Past Surgical History:  Procedure Laterality Date  . TONSILECTOMY, ADENOIDECTOMY, BILATERAL MYRINGOTOMY AND TUBES  1986   Social History   Occupational History  . Occupation: physicain  Tobacco Use  . Smoking status: Never Smoker  . Smokeless tobacco: Never Used  Substance and Sexual Activity  . Alcohol use: No  . Drug use: No  . Sexual activity: Not on file

## 2018-04-14 ENCOUNTER — Emergency Department (HOSPITAL_COMMUNITY)
Admission: EM | Admit: 2018-04-14 | Discharge: 2018-04-14 | Disposition: A | Discharge status: Home / Self Care | Payer: 59 | Attending: Emergency Medicine | Admitting: Emergency Medicine

## 2018-04-14 ENCOUNTER — Emergency Department (HOSPITAL_COMMUNITY): Payer: 59

## 2018-04-14 ENCOUNTER — Emergency Department (HOSPITAL_BASED_OUTPATIENT_CLINIC_OR_DEPARTMENT_OTHER): Payer: 59

## 2018-04-14 ENCOUNTER — Encounter (HOSPITAL_COMMUNITY): Payer: Self-pay | Admitting: *Deleted

## 2018-04-14 DIAGNOSIS — J45909 Unspecified asthma, uncomplicated: Secondary | ICD-10-CM | POA: Diagnosis not present

## 2018-04-14 DIAGNOSIS — Z79899 Other long term (current) drug therapy: Secondary | ICD-10-CM | POA: Diagnosis not present

## 2018-04-14 DIAGNOSIS — M79609 Pain in unspecified limb: Secondary | ICD-10-CM

## 2018-04-14 DIAGNOSIS — R0602 Shortness of breath: Secondary | ICD-10-CM | POA: Diagnosis not present

## 2018-04-14 DIAGNOSIS — R079 Chest pain, unspecified: Secondary | ICD-10-CM | POA: Diagnosis present

## 2018-04-14 DIAGNOSIS — M7989 Other specified soft tissue disorders: Secondary | ICD-10-CM

## 2018-04-14 DIAGNOSIS — M79661 Pain in right lower leg: Secondary | ICD-10-CM | POA: Insufficient documentation

## 2018-04-14 LAB — CBC WITH DIFFERENTIAL/PLATELET
Abs Immature Granulocytes: 0.03 10*3/uL (ref 0.00–0.07)
Basophils Absolute: 0.1 10*3/uL (ref 0.0–0.1)
Basophils Relative: 1 %
Eosinophils Absolute: 0.1 10*3/uL (ref 0.0–0.5)
Eosinophils Relative: 2 %
HCT: 39.1 % (ref 36.0–46.0)
Hemoglobin: 12.2 g/dL (ref 12.0–15.0)
IMMATURE GRANULOCYTES: 0 %
Lymphocytes Relative: 30 %
Lymphs Abs: 2.1 10*3/uL (ref 0.7–4.0)
MCH: 23.9 pg — ABNORMAL LOW (ref 26.0–34.0)
MCHC: 31.2 g/dL (ref 30.0–36.0)
MCV: 76.5 fL — ABNORMAL LOW (ref 80.0–100.0)
Monocytes Absolute: 0.7 10*3/uL (ref 0.1–1.0)
Monocytes Relative: 10 %
Neutro Abs: 3.9 10*3/uL (ref 1.7–7.7)
Neutrophils Relative %: 57 %
Platelets: 444 10*3/uL — ABNORMAL HIGH (ref 150–400)
RBC: 5.11 MIL/uL (ref 3.87–5.11)
RDW: 14.8 % (ref 11.5–15.5)
WBC: 6.8 10*3/uL (ref 4.0–10.5)
nRBC: 0 % (ref 0.0–0.2)

## 2018-04-14 LAB — I-STAT TROPONIN, ED: TROPONIN I, POC: 0 ng/mL (ref 0.00–0.08)

## 2018-04-14 LAB — BASIC METABOLIC PANEL
Anion gap: 11 (ref 5–15)
BUN: 17 mg/dL (ref 6–20)
CO2: 23 mmol/L (ref 22–32)
Calcium: 9.2 mg/dL (ref 8.9–10.3)
Chloride: 105 mmol/L (ref 98–111)
Creatinine, Ser: 0.87 mg/dL (ref 0.44–1.00)
GFR calc Af Amer: >60 mL/min (ref >60)
GFR calc non Af Amer: >60 mL/min (ref >60)
Glucose, Bld: 92 mg/dL (ref 70–99)
POTASSIUM: 4.8 mmol/L (ref 3.5–5.1)
Sodium: 139 mmol/L (ref 135–145)

## 2018-04-14 LAB — I-STAT BETA HCG BLOOD, ED (MC, WL, AP ONLY): I-stat hCG, quantitative: <5 m[IU]/mL (ref <5)

## 2018-04-14 MED ORDER — IOPAMIDOL (ISOVUE-370) INJECTION 76%
100.0000 mL | Freq: Once | INTRAVENOUS | Status: AC
  Administered 2018-04-14: 60 mL via INTRAVENOUS

## 2018-04-14 MED ORDER — SODIUM CHLORIDE 0.9 % IV BOLUS (SEPSIS)
1000.0000 mL | Freq: Once | INTRAVENOUS | Status: AC
  Administered 2018-04-14: 1000 mL via INTRAVENOUS

## 2018-04-14 MED ORDER — IOPAMIDOL (ISOVUE-370) INJECTION 76%
INTRAVENOUS | Status: AC
  Filled 2018-04-14: qty 100

## 2018-04-14 NOTE — ED Provider Notes (Signed)
Care assumed from Dr. Leonides Schanz.  At time of transfer care, patient is awaiting results of work-up to rule out PE or other concerning etiology of her shortness of breath.  Patient reports that last 2 days she has had shortness of breath worsened with exertion and some sharp chest discomfort.  She does not have chest pain at this time.  She reports to have some pain between her shoulder blades in her back.  She had reassuring vital signs on arrival with no tachycardia and no hypoxia.  Previous team ordered a CT PE study and ultrasound of the leg where she is been having some leg pain.  Anticipate discharge if work-up is reassuring.  8:18 AM Ultrasound was negative for DVT.  Initial troponin 0 0.00.  Previous team did not feel delta troponin was necessary as she has had several days of similar pain.  Patient agreed with plan per report.  Pregnancy is negative.  CBC shows no leukocytosis or anemia.  BMP reassuring.  Anticipate reassessment after PE study.  9:33 AM PE study was negative.  Other laboratory testing reassuring.  Vital signs have been reassuring here.  Patient is feeling slightly better.  Given unremarkable work-up, we feel safe for the patient being discharged.  Patient still does not want delta troponin or other work-up at this time.  Patient will follow-up with PCP.  Patient understood return precautions and other questions or concerns.  Patient was discharged in good condition with reassuring work-up.  Clinical Impression: 1. SOB (shortness of breath)     Disposition: Discharge  Condition: Good  I have discussed the results, Dx and Tx plan with the pt(& family if present). He/she/they expressed understanding and agree(s) with the plan. Discharge instructions discussed at great length. Strict return precautions discussed and pt &/or family have verbalized understanding of the instructions. No further questions at time of discharge.    New Prescriptions   No medications on file     Follow Up: Your PCP     [facility] [address] [id number] mc [place]       Aspen Lawrance, Gwenyth Allegra, MD 04/14/18 [phone]

## 2018-04-14 NOTE — ED Provider Notes (Signed)
TIME SEEN: 7:00 AM  CHIEF COMPLAINT: Chest pain, back pain, shortness of breath, dizziness  HPI: Patient is a 38 year old female with history of childhood asthma, MS, PCOS who presents to the emergency department with sharp chest pain and now pain in between her shoulder blades, shortness of breath and lightheadedness.  Symptoms started several days ago and have progressively worsened.  She also reports having posterior right knee pain.  Was seen by Dr. Erlinda Hong with orthopedics and had normal x-rays.  No lower extremity swelling but is concerned she could have a DVT and PE.  Recently underwent a retrieval for IVF and is now on estrogen pills and estrogen patches for potential embryo transfer next week.  Feels like she has been wheezing.  No fever.  No cough.  No flulike symptoms.  Never had PE or DVT before.  Patient is a non-smoker.  Patient works as a Psychologist, educational in our hospital.  ROS: See HPI Constitutional: no fever  Eyes: no drainage  ENT: no runny nose   Cardiovascular:   chest pain  Resp: SOB  GI: no vomiting GU: no dysuria Integumentary: no rash  Allergy: no hives  Musculoskeletal: no leg swelling  Neurological: no slurred speech ROS otherwise negative  PAST MEDICAL HISTORY/PAST SURGICAL HISTORY:  Past Medical History:  Diagnosis Date  . Asthma   . MS (multiple sclerosis) ([place])   . PCOS (polycystic ovarian syndrome)   . Raynaud's disease     MEDICATIONS:  Prior to Admission medications   Medication Sig Start Date End Date Taking? Authorizing Provider  esomeprazole (NEXIUM) 40 MG capsule TAKE 1 CAPSULE BY MOUTH ONCE DAILY 02/25/17   Lyndal Pulley, DO  loratadine (CLARITIN) 10 MG tablet Take 10 mg by mouth daily as needed for allergies.    [provider]  meloxicam (MOBIC) 7.5 MG tablet Take 2 tablets (15 mg total) by mouth daily as needed for pain. 04/09/18   Aundra Dubin, PA-C  metFORMIN (GLUCOPHAGE) 500 MG tablet Take 1 tablet (500 mg total) by mouth daily with  breakfast. 12/27/16   Lyndal Pulley, DO  methylPREDNISolone (MEDROL DOSEPAK) 4 MG TBPK tablet follow package directions 01/04/17   Melvenia Beam, MD  ondansetron (ZOFRAN ODT) 4 MG disintegrating tablet Take 1 tablet (4 mg total) by mouth every 8 (eight) hours as needed for nausea or vomiting. 01/04/17   Melvenia Beam, MD  rizatriptan (MAXALT) 10 MG tablet Take 1 tablet (10 mg total) by mouth as needed for migraine. May repeat in 2 hours if needed 01/04/17   Melvenia Beam, MD  zolpidem (AMBIEN) 10 MG tablet Take 5 mg as needed by mouth.  06/14/16   [provider]    ALLERGIES:  No Known Allergies  SOCIAL HISTORY:  Social History   Tobacco Use  . Smoking status: Never Smoker  . Smokeless tobacco: Never Used  Substance Use Topics  . Alcohol use: No    FAMILY HISTORY: Family History  Problem Relation Age of Onset  . Diabetes Mother   . Heart disease Mother   . Thyroid disease Mother   . Hypertension Mother   . Hypercholesterolemia Mother   . Migraines Mother   . Diabetes Father   . Thyroid disease Father   . Hyperlipidemia Father   . CAD Father   . Hypercholesterolemia Father   . Hypertension Father   . Breast cancer Paternal Aunt     EXAM: BP (!) 144/98 (BP Location: Right Arm)   Pulse 87  Temp (!) 97.3 F (36.3 C) (Oral)   Resp 20   Ht 5\' 5"  (1.651 m)   Wt 90.7 kg   SpO2 100%   BMI 33.28 kg/m  CONSTITUTIONAL: Alert and oriented and responds appropriately to questions.  Appears uncomfortable, increased work of breathing with exertion, tearful HEAD: Normocephalic EYES: Conjunctivae clear, pupils appear equal, EOMI ENT: normal nose; moist mucous membranes NECK: Supple, no meningismus, no nuchal rigidity, no LAD  CARD: RRR; S1 and S2 appreciated; no murmurs, no clicks, no rubs, no gallops RESP: Normal chest excursion without splinting, tachypneic with exertion; breath sounds clear and equal bilaterally; no wheezes, no rhonchi, no rales, no hypoxia  or respiratory distress, speaking full sentences ABD/GI: Normal bowel sounds; non-distended; soft, non-tender, no rebound, no guarding, no peritoneal signs, no hepatosplenomegaly BACK:  The back appears normal and is non-tender to palpation, there is no CVA tenderness EXT: Tender to palpation in the right popliteal fossa without obvious swelling or deformity.  No bony tenderness over the right knee and no joint effusion currently.  No redness or warmth.  2+ right DP pulse.  No significant calf tenderness or swelling noted compared to the left.  Extremity warm and well-perfused.  Compartments in the right leg soft. SKIN: Normal color for age and race; warm; no rash NEURO: Moves all extremities equally PSYCH: The patient's mood and manner are appropriate. Grooming and personal hygiene are appropriate.  MEDICAL DECISION MAKING: Patient here with concerns for PE and DVT.  Doubt ACS, dissection.  No infectious symptoms.  Given patient is high risk for PE given she is on estrogen supplementation for IVF, will perform a CTA.  Discussed this with patient and she agrees.  Labs pending.  Will also obtain Doppler of the right lower extremity.  Patient declines pain medication at this time.  EKG shows no ischemic abnormality, arrhythmia, or interval changes.  ED PROGRESS: Signed out to Dr. Sherry Ruffing will follow-up on patient's labs, imaging.  I reviewed all nursing notes, vitals, pertinent previous records, EKGs, lab and urine results, imaging (as available).      EKG Interpretation  Date/Time:  Monday April 14 2018 06:51:48 EST Ventricular Rate:  88 PR Interval:  178 QRS Duration: 78 QT Interval:  350 QTC Calculation: 423 R Axis:   64 Text Interpretation:  Normal sinus rhythm Normal ECG No old tracing to compare Confirmed by Ondria Oswald, Cyril Mourning [phone]) on 04/14/2018 6:55:36 AM         Obadiah Dennard, Delice Bison, DO 04/14/18 [phone]

## 2018-04-14 NOTE — Discharge Instructions (Signed)
Your work-up today was overall reassuring.  The CT scan did not show evidence of pneumonia, pneumothorax, pulmonary embolism, or other concerning thoracic pathology.  Your troponin was negative and her EKG was reassuring.  Your ultrasound not show evidence of DVT.  Given your reassuring work-up, we feel you are safe for discharge home.  Please follow-up with your primary doctor in the neck several days.  If any symptoms change or worsen, please return to the nearest emergency department.  Please stay hydrated and rest.

## 2018-04-14 NOTE — ED Triage Notes (Signed)
To ED for eval of SOB for past day. Pt denies cp. States she recently injured her knee. Pt is on hormone therapy for IVF. Speaks in full sentences but states she is unable take a deep breath. Felt weaker and more sob this am while getting ready for work.

## 2018-04-14 NOTE — Progress Notes (Signed)
VASCULAR LAB PRELIMINARY  PRELIMINARY  PRELIMINARY  PRELIMINARY  Right lower extremity venous duplex completed.    Preliminary report:  See CV Proc  Gave results to Dr. Emelda Fear, [facility], RVT 04/14/2018, 8:09 AM

## 2018-04-14 NOTE — ED Notes (Signed)
Patient transported to CT 

## 2018-04-14 NOTE — ED Notes (Signed)
Pt verbalized understanding of discharge paperwork.

## 2018-04-15 ENCOUNTER — Ambulatory Visit (INDEPENDENT_AMBULATORY_CARE_PROVIDER_SITE_OTHER): Payer: 59 | Admitting: Orthopaedic Surgery

## 2018-04-15 DIAGNOSIS — Z760 Encounter for issue of repeat prescription: Secondary | ICD-10-CM | POA: Diagnosis not present

## 2018-04-16 ENCOUNTER — Ambulatory Visit: Payer: Self-pay

## 2018-04-16 ENCOUNTER — Ambulatory Visit (INDEPENDENT_AMBULATORY_CARE_PROVIDER_SITE_OTHER): Payer: 59 | Admitting: Family Medicine

## 2018-04-16 VITALS — BP 130/92 | HR 75 | Ht 65.0 in | Wt 200.0 lb

## 2018-04-16 DIAGNOSIS — M546 Pain in thoracic spine: Secondary | ICD-10-CM

## 2018-04-16 DIAGNOSIS — M9983 Other biomechanical lesions of lumbar region: Secondary | ICD-10-CM | POA: Diagnosis not present

## 2018-04-16 DIAGNOSIS — M25561 Pain in right knee: Secondary | ICD-10-CM

## 2018-04-16 DIAGNOSIS — M999 Biomechanical lesion, unspecified: Secondary | ICD-10-CM | POA: Diagnosis not present

## 2018-04-16 DIAGNOSIS — S83241A Other tear of medial meniscus, current injury, right knee, initial encounter: Secondary | ICD-10-CM | POA: Diagnosis not present

## 2018-04-16 DIAGNOSIS — S83249A Other tear of medial meniscus, current injury, unspecified knee, initial encounter: Secondary | ICD-10-CM | POA: Insufficient documentation

## 2018-04-16 DIAGNOSIS — G8929 Other chronic pain: Secondary | ICD-10-CM | POA: Diagnosis not present

## 2018-04-16 DIAGNOSIS — Z3183 Encounter for assisted reproductive fertility procedure cycle: Secondary | ICD-10-CM | POA: Diagnosis not present

## 2018-04-16 MED ORDER — DICLOFENAC SODIUM 2 % TD SOLN: 2.0000 g | Freq: Two times a day (BID) | TRANSDERMAL | 3 refills | Status: DC

## 2018-04-16 NOTE — Assessment & Plan Note (Signed)
Decision today to treat with OMT was based on Physical Exam  After verbal consent patient was treated with HVLA, ME, FPR techniques in  thoracic, rib lumbar and sacral areas  Patient tolerated the procedure well with improvement in symptoms  Patient given exercises, stretches and lifestyle modifications  See medications in patient instructions if given  Patient will follow up in 3-6 weeks

## 2018-04-16 NOTE — Assessment & Plan Note (Signed)
Right-sided meniscus tear.  Seems to be in fairly large with 25 to 50% displacement on the posterior aspect.  Likely more of a longitudinal versus possible buckle handle with patient having some mild locking.  We discussed the instability is being a potential issue and may need surgical intervention then.  Patient is to increase activity slowly over the course the next several weeks.  Home exercises given.  Encourage bracing.  Avoiding twisting motions.  Follow-up again in 3 to 4 weeks and we will re-ultrasound to make sure we are making some progress.

## 2018-04-16 NOTE — Assessment & Plan Note (Signed)
Thoracolumbar tightness.  Patient does have tight hip flexors.  I believe that patient has been ambulating difficulty secondary to the meniscus tear giving more trouble.  This is causing more discomfort and pain overall.  Patient did respond well to manipulation.  Helped with some of the increasing in breathing.  Patient has had everything else ruled out such as a deep venous thrombosis or pulmonary embolism likely given the shortness of breath.  No fever today.  Patient will follow-up with me again 3 weeks

## 2018-04-16 NOTE — Progress Notes (Signed)
[name] [address] [place], [place] [address] Phone: [phone] Subjective:   [name], am serving as a scribe for Dr. Hulan Saas.  I'm seeing this patient by the request  of:    CC: Back pain knee pain  BJY:[id number]  [name] is a 38 y.o. female coming in with complaint of upper back pain. She has not been able to take a deep breath in. Symptoms began one week ago.  Patient was having difficulty with breathing with patient being on estrogen replacements patient was concerned for a pulmonary embolism.  Patient did go to the emergency room and had a CT angiogram done.  Independently visualized by me showing no significant clot.  Patient also had a lower extremity Doppler done that was also unremarkable for DVT.  Right knee was achy when she got in bed on Tuesday night. Has had doppler, echo, and saw ortho who thought she may have meniscus tear. Sharp pain has subsided but is having generalized muscle soreness surrounding her knee.  Patient saw another provider who wanted to do an injection.  Was concerned on this.  Patient states that sometimes it feels like her knee locks and she is unable to move it for some time.        Past Medical History:  Diagnosis Date  . Asthma   . MS (multiple sclerosis) ([place])   . PCOS (polycystic ovarian syndrome)   . Raynaud's disease    Past Surgical History:  Procedure Laterality Date  . TONSILECTOMY, ADENOIDECTOMY, BILATERAL MYRINGOTOMY AND TUBES  1986   Social History   Socioeconomic History  . Marital status: Married    Spouse name: Not on file  . Number of children: 0  . Years of education: Not on file  . Highest education level: Not on file  Occupational History  . Occupation: physicain  Social Needs  . Financial resource strain: Not on file  . Food insecurity:    Worry: Not on file    Inability: Not on file  . Transportation needs:    Medical: Not on file    Non-medical: Not on  file  Tobacco Use  . Smoking status: Never Smoker  . Smokeless tobacco: Never Used  Substance and Sexual Activity  . Alcohol use: No  . Drug use: No  . Sexual activity: Not on file  Lifestyle  . Physical activity:    Days per week: Not on file    Minutes per session: Not on file  . Stress: Not on file  Relationships  . Social connections:    Talks on phone: Not on file    Gets together: Not on file    Attends religious service: Not on file    Active member of club or organization: Not on file    Attends meetings of clubs or organizations: Not on file    Relationship status: Not on file  Other Topics Concern  . Not on file  Social History Narrative   Right handed   1-2 cups caffeine daily   Lives at home with husband   No Known Allergies Family History  Problem Relation Age of Onset  . Diabetes Mother   . Heart disease Mother   . Thyroid disease Mother   . Hypertension Mother   . Hypercholesterolemia Mother   . Migraines Mother   . Diabetes Father   . Thyroid disease Father   . Hyperlipidemia Father   . CAD Father   .  Hypercholesterolemia Father   . Hypertension Father   . Breast cancer Paternal Aunt     Current Outpatient Medications (Endocrine & Metabolic):  .  estradiol (ESTRACE) 2 MG tablet, Take 2 mg by mouth 2 (two) times daily. Marland Kitchen  estradiol (VIVELLE-DOT) 0.1 MG/24HR patch, Place 0.1 mg onto the skin every 3 (three) days. .  metFORMIN (GLUCOPHAGE) 500 MG tablet, Take 1 tablet (500 mg total) by mouth daily with breakfast. (Patient taking differently: Take 1,000 mg by mouth 2 (two) times daily with a meal. ) .  methylPREDNISolone (MEDROL DOSEPAK) 4 MG TBPK tablet, follow package directions   Current Outpatient Medications (Respiratory):  .  loratadine (CLARITIN) 10 MG tablet, Take 10 mg by mouth daily as needed for allergies.  Current Outpatient Medications (Analgesics):  .  aspirin EC 81 MG tablet, Take 81 mg by mouth daily. .  meloxicam (MOBIC) 7.5 MG  tablet, Take 2 tablets (15 mg total) by mouth daily as needed for pain. .  rizatriptan (MAXALT) 10 MG tablet, Take 1 tablet (10 mg total) by mouth as needed for migraine. May repeat in 2 hours if needed   Current Outpatient Medications (Other):  .  esomeprazole (NEXIUM) 40 MG capsule, TAKE 1 CAPSULE BY MOUTH ONCE DAILY .  ondansetron (ZOFRAN ODT) 4 MG disintegrating tablet, Take 1 tablet (4 mg total) by mouth every 8 (eight) hours as needed for nausea or vomiting. .  Prenatal Vit-Fe Fumarate-FA (PRENATAL MULTIVITAMIN) TABS tablet, Take 1 tablet by mouth daily at 12 noon. Marland Kitchen  zolpidem (AMBIEN) 5 MG tablet, Take 5 mg by mouth as needed for sleep.     Past medical history, social, surgical and family history all reviewed in electronic medical record.  No pertanent information unless stated regarding to the chief complaint.   Review of Systems:  No headache, visual changes, nausea, vomiting, diarrhea, constipation, dizziness, abdominal pain, skin rash, fevers, chills, night sweats, weight loss, swollen lymph nodes, body aches, joint swelling,  chest pain, shortness of breath, mood changes.  Positive muscle aches  Objective  Blood pressure (!) 130/92, pulse 75, height 5\' 5"  (1.651 m), weight 200 lb (90.7 kg), last menstrual period 04/04/2018, SpO2 99 %.   General: No apparent distress alert and oriented x3 mood and affect normal, dressed appropriately.  HEENT: Pupils equal, extraocular movements intact  Respiratory: Patient's speak in full sentences and does not appear short of breath  Cardiovascular: No lower extremity edema, non tender, no erythema  Skin: Warm dry intact with no signs of infection or rash on extremities or on axial skeleton.  Abdomen: Soft nontender  Neuro: Cranial nerves II through XII are intact, neurovascularly intact in all extremities with 2+ DTRs and 2+ pulses.  Lymph: No lymphadenopathy of posterior or anterior cervical chain or axillae bilaterally.  Gait  antalgic MSK:  Non tender with full range of motion and good stability and symmetric strength and tone of shoulders, elbows, wrist, hip, and ankles bilaterally.  Back Exam:  Inspection: Mild loss of lordosis Motion: Flexion 45 deg, Extension 25 deg, Side Bending to 35 deg bilaterally,  Rotation to 35 deg bilaterally  SLR laying: Negative  XSLR laying: Negative  Palpable tenderness: To palpation in the paraspinal musculature right greater than left. FABER: negative. Sensory change: Gross sensation intact to all lumbar and sacral dermatomes.  Reflexes: 2+ at both patellar tendons, 2+ at achilles tendons, Babinski's downgoing.  Strength at foot  Plantar-flexion: 5/5 Dorsi-flexion: 5/5 Eversion: 5/5 Inversion: 5/5  Leg strength  Quad: 5/5  Hamstring: 5/5 Hip flexor: 5/5 Hip abductors: 5/5  Gait unremarkable.  Right knee exam shows the patient does have full range of motion.  Patient is tender over the medial joint line.  Positive McMurray's noted.  Hamstrings are moderately tight.  No radicular symptoms of the lower back.  Osteopathic findings T3 extended rotated and side bent right inhaled third rib T9 extended rotated and side bent left L3 flexed rotated and side bent right Sacrum right on right  \ Musculoskeletal ultrasound was performed and interpreted by Lyndal Pulley  Demented ultrasound of patient's right knee shows the patient does have a displaced medial meniscal tear posteriorly.  Fairly large.  Hypoechoic changes of the surrounding area.  No signs of any type of Baker's cyst.  Minimal narrowing of the medial joint line but patellofemoral seems to have good cartilage. Impression: Medial meniscal tear   Impression and Recommendations:     This case required medical decision making of moderate complexity. The above documentation has been reviewed and is accurate and complete Lyndal Pulley, DO       Note: This dictation was prepared with Dragon dictation along with  smaller phrase technology. Any transcriptional errors that result from this process are unintentional.

## 2018-04-16 NOTE — Patient Instructions (Signed)
Good to see you  Ice is your friend Stay active  New exercises for the meniscus  No planting and twisting  Continue the pennsaid See me again in 3-6 weeks

## 2018-04-22 DIAGNOSIS — N8500 Endometrial hyperplasia, unspecified: Secondary | ICD-10-CM | POA: Diagnosis not present

## 2018-04-22 DIAGNOSIS — N96 Recurrent pregnancy loss: Secondary | ICD-10-CM | POA: Diagnosis not present

## 2018-04-22 DIAGNOSIS — N856 Intrauterine synechiae: Secondary | ICD-10-CM | POA: Diagnosis not present

## 2018-05-02 MED FILL — ESTRADIOL 0.1 MG PATCH: 0.1 | 28 days supply | Qty: 8 | Fill #2

## 2018-05-02 MED FILL — ESTRADIOL 2 MG TABLET: 2 | 30 days supply | Qty: 60 | Fill #1

## 2018-05-02 MED FILL — ZOLPIDEM TARTRATE 10 MG TAB: 10 | 30 days supply | Qty: 30 | Fill #2 | Status: TO

## 2018-05-04 NOTE — Progress Notes (Signed)
[name] [place], [place] [address] Phone: [phone] Subjective:   [name], am serving as a scribe for Dr. Hulan Saas. CC: Right-sided knee pain  LOV:[id number]   04/16/2018: Right-sided meniscus tear.  Seems to be in fairly large with 25 to 50% displacement on the posterior aspect.  Likely more of a longitudinal versus possible buckle handle with patient having some mild locking.  We discussed the instability is being a potential issue and may need surgical intervention then.  Patient is to increase activity slowly over the course the next several weeks.  Home exercises given.  Encourage bracing.  Avoiding twisting motions.  Follow-up again in 3 to 4 weeks and we will re-ultrasound to make sure we are making some progress.  05/05/2018: Martha [name] is a 38 y.o. female coming in with complaint of right knee pain. Patient states that her left knee is now bothering her. She is wearing a brace on both right and left. Patient has been icing and using Pennsaid. Pain increases with stair climbing but overall patient feels improvement.  No locking or giving out on her.  Feels like she has been doing okay making progress in using the brace less and less     Past Medical History:  Diagnosis Date  . Asthma   . MS (multiple sclerosis) ([place])   . PCOS (polycystic ovarian syndrome)   . Raynaud's disease    Past Surgical History:  Procedure Laterality Date  . TONSILECTOMY, ADENOIDECTOMY, BILATERAL MYRINGOTOMY AND TUBES  1986   Social History   Socioeconomic History  . Marital status: Married    Spouse name: Not on file  . Number of children: 0  . Years of education: Not on file  . Highest education level: Not on file  Occupational History  . Occupation: physicain  Social Needs  . Financial resource strain: Not on file  . Food insecurity:    Worry: Not on file    Inability: Not on file  . Transportation needs:    Medical: Not on  file    Non-medical: Not on file  Tobacco Use  . Smoking status: Never Smoker  . Smokeless tobacco: Never Used  Substance and Sexual Activity  . Alcohol use: No  . Drug use: No  . Sexual activity: Not on file  Lifestyle  . Physical activity:    Days per week: Not on file    Minutes per session: Not on file  . Stress: Not on file  Relationships  . Social connections:    Talks on phone: Not on file    Gets together: Not on file    Attends religious service: Not on file    Active member of club or organization: Not on file    Attends meetings of clubs or organizations: Not on file    Relationship status: Not on file  Other Topics Concern  . Not on file  Social History Narrative   Right handed   1-2 cups caffeine daily   Lives at home with husband   No Known Allergies Family History  Problem Relation Age of Onset  . Diabetes Mother   . Heart disease Mother   . Thyroid disease Mother   . Hypertension Mother   . Hypercholesterolemia Mother   . Migraines Mother   . Diabetes Father   . Thyroid disease Father   . Hyperlipidemia Father   . CAD Father   . Hypercholesterolemia Father   . Hypertension  Father   . Breast cancer Paternal Aunt     Current Outpatient Medications (Endocrine & Metabolic):  .  estradiol (ESTRACE) 2 MG tablet, Take 2 mg by mouth 2 (two) times daily. Marland Kitchen  estradiol (VIVELLE-DOT) 0.1 MG/24HR patch, Place 0.1 mg onto the skin every 3 (three) days. .  metFORMIN (GLUCOPHAGE) 500 MG tablet, Take 1 tablet (500 mg total) by mouth daily with breakfast. (Patient taking differently: Take 1,000 mg by mouth 2 (two) times daily with a meal. ) .  methylPREDNISolone (MEDROL DOSEPAK) 4 MG TBPK tablet, follow package directions   Current Outpatient Medications (Respiratory):  .  loratadine (CLARITIN) 10 MG tablet, Take 10 mg by mouth daily as needed for allergies.  Current Outpatient Medications (Analgesics):  .  aspirin EC 81 MG tablet, Take 81 mg by mouth daily. .   meloxicam (MOBIC) 7.5 MG tablet, Take 2 tablets (15 mg total) by mouth daily as needed for pain. .  rizatriptan (MAXALT) 10 MG tablet, Take 1 tablet (10 mg total) by mouth as needed for migraine. May repeat in 2 hours if needed   Current Outpatient Medications (Other):  Marland Kitchen  Diclofenac Sodium (PENNSAID) 2 % SOLN, Place 2 g onto the skin 2 (two) times daily. Marland Kitchen  esomeprazole (NEXIUM) 40 MG capsule, TAKE 1 CAPSULE BY MOUTH ONCE DAILY .  ondansetron (ZOFRAN ODT) 4 MG disintegrating tablet, Take 1 tablet (4 mg total) by mouth every 8 (eight) hours as needed for nausea or vomiting. .  Prenatal Vit-Fe Fumarate-FA (PRENATAL MULTIVITAMIN) TABS tablet, Take 1 tablet by mouth daily at 12 noon. Marland Kitchen  zolpidem (AMBIEN) 5 MG tablet, Take 5 mg by mouth as needed for sleep.  .  cyclobenzaprine (FLEXERIL) 5 MG tablet, Take 1 tablet (5 mg total) by mouth 3 (three) times daily as needed for muscle spasms.    Past medical history, social, surgical and family history all reviewed in electronic medical record.  No pertanent information unless stated regarding to the chief complaint.   Review of Systems:  No headache, visual changes, nausea, vomiting, diarrhea, constipation, dizziness, abdominal pain, skin rash, fevers, chills, night sweats, weight loss, swollen lymph nodes, body aches, joint swelling, muscle aches, chest pain, shortness of breath, mood changes.   Objective  Blood pressure (!) 118/94, pulse 94, height 5\' 5"  (1.651 m), weight 204 lb (92.5 kg), SpO2 95 %.    General: No apparent distress alert and oriented x3 mood and affect normal, dressed appropriately.  HEENT: Pupils equal, extraocular movements intact  Respiratory: Patient's speak in full sentences and does not appear short of breath  Cardiovascular: No lower extremity edema, non tender, no erythema  Skin: Warm dry intact with no signs of infection or rash on extremities or on axial skeleton.  Abdomen: Soft nontender  Neuro: Cranial nerves II  through XII are intact, neurovascularly intact in all extremities with 2+ DTRs and 2+ pulses.  Lymph: No lymphadenopathy of posterior or anterior cervical chain or axillae bilaterally.  Gait normal with good balance and coordination.  MSK:  Non tender with full range of motion and good stability and symmetric strength and tone of shoulders, elbows, wrist, hip and ankles bilaterally.  Knee: Right Normal to inspection with no erythema or effusion or obvious bony abnormalities. Mild tenderness to palpation of the medial aspect of the knee ROM full in flexion and extension and lower leg rotation. Ligaments with solid consistent endpoints including ACL, PCL, LCL, MCL. Negative Mcmurray's, Apley's, and Thessalonian tests. Non painful patellar compression. Patellar  glide without crepitus. Patellar and quadriceps tendons unremarkable. Hamstring and quadriceps strength is normal. Contralateral knee unremarkable  Limited musculoskeletal ultrasound was performed and interpreted by Lyndal Pulley  Limited ultrasound shows the patient does have mild hypoechoic changes around the meniscus but no significant displacement noted at this time.  Patient notes no swelling at the moment. Pression: Medial meniscal tear with interval improvement    Impression and Recommendations:      The above documentation has been reviewed and is accurate and complete Lyndal Pulley, DO       Note: This dictation was prepared with Dragon dictation along with smaller phrase technology. Any transcriptional errors that result from this process are unintentional.

## 2018-05-05 ENCOUNTER — Ambulatory Visit: Payer: Self-pay

## 2018-05-05 ENCOUNTER — Encounter: Payer: Self-pay | Admitting: Family Medicine

## 2018-05-05 ENCOUNTER — Ambulatory Visit (INDEPENDENT_AMBULATORY_CARE_PROVIDER_SITE_OTHER): Payer: 59 | Admitting: Family Medicine

## 2018-05-05 VITALS — BP 118/94 | HR 94 | Ht 65.0 in | Wt 204.0 lb

## 2018-05-05 DIAGNOSIS — M25561 Pain in right knee: Secondary | ICD-10-CM

## 2018-05-05 DIAGNOSIS — S83241D Other tear of medial meniscus, current injury, right knee, subsequent encounter: Secondary | ICD-10-CM

## 2018-05-05 MED ORDER — CYCLOBENZAPRINE HCL 5 MG PO TABS: 5.0000 mg | ORAL_TABLET | Freq: Three times a day (TID) | ORAL | 1 refills | Status: DC | PRN

## 2018-05-05 MED FILL — CYCLOBENZAPRINE 5 MG TABLET: 5 | 20 days supply | Qty: 60 | Fill #0

## 2018-05-05 NOTE — Patient Instructions (Signed)
Good to see you  Fingers crossed! OK to increase activity  Try to start to limit the brace in 2 weeks a little more  Still avoid twisting motions for 3 weeks See me again in 4 weeks

## 2018-05-05 NOTE — Assessment & Plan Note (Signed)
Improvement noted.  Discussed icing regimen and home exercises.  Which activities of doing which wants to avoid.  Discussed topical anti-inflammatories.  Discussed compression.  Follow-up again in 4 weeks

## 2018-05-09 DIAGNOSIS — Z3183 Encounter for assisted reproductive fertility procedure cycle: Secondary | ICD-10-CM | POA: Diagnosis not present

## 2018-05-09 DIAGNOSIS — Z113 Encounter for screening for infections with a predominantly sexual mode of transmission: Secondary | ICD-10-CM | POA: Diagnosis not present

## 2018-05-09 DIAGNOSIS — O2621 Pregnancy care for patient with recurrent pregnancy loss, first trimester: Secondary | ICD-10-CM | POA: Diagnosis not present

## 2018-05-09 MED FILL — PENTOXIFYLLINE 400 MG TAB S: 400 | 10 days supply | Qty: 30 | Fill #0

## 2018-05-12 ENCOUNTER — Encounter: Payer: Self-pay | Admitting: Family Medicine

## 2018-05-14 DIAGNOSIS — Z3183 Encounter for assisted reproductive fertility procedure cycle: Secondary | ICD-10-CM | POA: Diagnosis not present

## 2018-05-14 MED FILL — NORETHINDRONE 5 MG TABLET: 5 | 5 days supply | Qty: 5 | Fill #0

## 2018-05-23 DIAGNOSIS — Z3183 Encounter for assisted reproductive fertility procedure cycle: Secondary | ICD-10-CM | POA: Diagnosis not present

## 2018-06-03 ENCOUNTER — Ambulatory Visit: Payer: 59 | Admitting: Family Medicine

## 2018-06-24 MED FILL — ZOLPIDEM TARTRATE 10 MG TAB: 10 | 30 days supply | Qty: 30 | Fill #0

## 2018-06-24 MED FILL — KETOCONAZOLE 2% CREAM: 2 | 10 days supply | Qty: 30 | Fill #0

## 2018-07-15 ENCOUNTER — Encounter: Payer: Self-pay | Admitting: Family Medicine

## 2018-08-06 ENCOUNTER — Ambulatory Visit (INDEPENDENT_AMBULATORY_CARE_PROVIDER_SITE_OTHER): Payer: 59 | Admitting: Family Medicine

## 2018-08-06 ENCOUNTER — Encounter: Payer: Self-pay | Admitting: Family Medicine

## 2018-08-06 ENCOUNTER — Ambulatory Visit: Payer: Self-pay

## 2018-08-06 ENCOUNTER — Other Ambulatory Visit: Payer: Self-pay

## 2018-08-06 VITALS — BP 112/84 | Ht 65.0 in | Wt 204.0 lb

## 2018-08-06 DIAGNOSIS — M25561 Pain in right knee: Secondary | ICD-10-CM | POA: Diagnosis not present

## 2018-08-06 DIAGNOSIS — M546 Pain in thoracic spine: Secondary | ICD-10-CM | POA: Diagnosis not present

## 2018-08-06 DIAGNOSIS — M999 Biomechanical lesion, unspecified: Secondary | ICD-10-CM | POA: Diagnosis not present

## 2018-08-06 DIAGNOSIS — M25562 Pain in left knee: Secondary | ICD-10-CM

## 2018-08-06 DIAGNOSIS — G8929 Other chronic pain: Secondary | ICD-10-CM

## 2018-08-06 DIAGNOSIS — M7652 Patellar tendinitis, left knee: Secondary | ICD-10-CM | POA: Diagnosis not present

## 2018-08-06 NOTE — Assessment & Plan Note (Signed)
Mild overall.  Likely reactive.  Has been doing more cleaning around the house and likely compensating for patient's older son with a meniscal tear.  Doing relatively well though.  Discussed icing regimen and home exercise.  Follow-up again in 4 to 8 weeks

## 2018-08-06 NOTE — Patient Instructions (Signed)
Patellar strap  Hoka Newton See me in 6 weeks

## 2018-08-06 NOTE — Assessment & Plan Note (Signed)
Pain more in the paraspinal musculature lumbar spine.  Has responded very well to osteopathic manipulation.  Discussed posture and ergonomics.  Discussed which activities doing which wants to avoid.  Discussed

## 2018-08-06 NOTE — Assessment & Plan Note (Signed)
Decision today to treat with OMT was based on Physical Exam  After verbal consent patient was treated with HVLA, ME, FPR techniques in cervical, thoracic, rib lumbar and sacral areas  Patient tolerated the procedure well with improvement in symptoms  Patient given exercises, stretches and lifestyle modifications  See medications in patient instructions if given  Patient will follow up in 4-8 weeks 

## 2018-08-06 NOTE — Progress Notes (Signed)
[name] [place], [name] [address] Phone: [phone] Subjective:    I'm seeing this patient by the request  of:    CC: Neck pain, back pain and bilateral knee pain  WIO:[id number]   05/05/2018: Improvement noted.  Discussed icing regimen and home exercises.  Which activities of doing which wants to avoid.  Discussed topical anti-inflammatories.  Discussed compression.  Follow-up again in 4 weeks  Update 08/06/2018: Martha [name] is a 38 y.o. female coming in with complaint of right knee pain and back pain. Patient states that her left knee is now bothering her. Did have a pop in the right knee last week. Was standing at time of pop. No pain associated with pop. Has weaned off wearing a brace for her knee.   Has been using her inversion table for her back pain. Has used OMT to treat her back pain successfully.  Patient has been doing remarkably well no significant aching ibuprofen occasionally.     Past Medical History:  Diagnosis Date  . Asthma   . MS (multiple sclerosis) ([place])   . PCOS (polycystic ovarian syndrome)   . Raynaud's disease    Past Surgical History:  Procedure Laterality Date  . TONSILECTOMY, ADENOIDECTOMY, BILATERAL MYRINGOTOMY AND TUBES  1986   Social History   Socioeconomic History  . Marital status: Married    Spouse name: Not on file  . Number of children: 0  . Years of education: Not on file  . Highest education level: Not on file  Occupational History  . Occupation: physicain  Social Needs  . Financial resource strain: Not on file  . Food insecurity:    Worry: Not on file    Inability: Not on file  . Transportation needs:    Medical: Not on file    Non-medical: Not on file  Tobacco Use  . Smoking status: Never Smoker  . Smokeless tobacco: Never Used  Substance and Sexual Activity  . Alcohol use: No  . Drug use: No  . Sexual activity: Not on file  Lifestyle  . Physical activity:    Days per  week: Not on file    Minutes per session: Not on file  . Stress: Not on file  Relationships  . Social connections:    Talks on phone: Not on file    Gets together: Not on file    Attends religious service: Not on file    Active member of club or organization: Not on file    Attends meetings of clubs or organizations: Not on file    Relationship status: Not on file  Other Topics Concern  . Not on file  Social History Narrative   Right handed   1-2 cups caffeine daily   Lives at home with husband   No Known Allergies Family History  Problem Relation Age of Onset  . Diabetes Mother   . Heart disease Mother   . Thyroid disease Mother   . Hypertension Mother   . Hypercholesterolemia Mother   . Migraines Mother   . Diabetes Father   . Thyroid disease Father   . Hyperlipidemia Father   . CAD Father   . Hypercholesterolemia Father   . Hypertension Father   . Breast cancer Paternal Aunt     Current Outpatient Medications (Endocrine & Metabolic):  .  estradiol (ESTRACE) 2 MG tablet, Take 2 mg by mouth 2 (two) times daily. Marland Kitchen  estradiol (VIVELLE-DOT) 0.1 MG/24HR patch, Place 0.1  mg onto the skin every 3 (three) days. .  metFORMIN (GLUCOPHAGE) 500 MG tablet, Take 1 tablet (500 mg total) by mouth daily with breakfast. (Patient taking differently: Take 1,000 mg by mouth 2 (two) times daily with a meal. ) .  methylPREDNISolone (MEDROL DOSEPAK) 4 MG TBPK tablet, follow package directions   Current Outpatient Medications (Respiratory):  .  loratadine (CLARITIN) 10 MG tablet, Take 10 mg by mouth daily as needed for allergies.  Current Outpatient Medications (Analgesics):  .  aspirin EC 81 MG tablet, Take 81 mg by mouth daily. .  meloxicam (MOBIC) 7.5 MG tablet, Take 2 tablets (15 mg total) by mouth daily as needed for pain. .  rizatriptan (MAXALT) 10 MG tablet, Take 1 tablet (10 mg total) by mouth as needed for migraine. May repeat in 2 hours if needed   Current Outpatient  Medications (Other):  .  cyclobenzaprine (FLEXERIL) 5 MG tablet, Take 1 tablet (5 mg total) by mouth 3 (three) times daily as needed for muscle spasms. .  Diclofenac Sodium (PENNSAID) 2 % SOLN, Place 2 g onto the skin 2 (two) times daily. Marland Kitchen  esomeprazole (NEXIUM) 40 MG capsule, TAKE 1 CAPSULE BY MOUTH ONCE DAILY .  ondansetron (ZOFRAN ODT) 4 MG disintegrating tablet, Take 1 tablet (4 mg total) by mouth every 8 (eight) hours as needed for nausea or vomiting. .  Prenatal Vit-Fe Fumarate-FA (PRENATAL MULTIVITAMIN) TABS tablet, Take 1 tablet by mouth daily at 12 noon. Marland Kitchen  zolpidem (AMBIEN) 5 MG tablet, Take 5 mg by mouth as needed for sleep.     Past medical history, social, surgical and family history all reviewed in electronic medical record.  No pertanent information unless stated regarding to the chief complaint.   Review of Systems:  No headache, visual changes, nausea, vomiting, diarrhea, constipation, dizziness, abdominal pain, skin rash, fevers, chills, night sweats, weight loss, swollen lymph nodes, body aches, joint swelling, muscle aches, chest pain, shortness of breath, mood changes.   Objective  There were no vitals taken for this visit. Systems examined below as of    General: No apparent distress alert and oriented x3 mood and affect normal, dressed appropriately.  HEENT: Pupils equal, extraocular movements intact  Respiratory: Patient's speak in full sentences and does not appear short of breath  Cardiovascular: No lower extremity edema, non tender, no erythema  Skin: Warm dry intact with no signs of infection or rash on extremities or on axial skeleton.  Abdomen: Soft nontender  Neuro: Cranial nerves II through XII are intact, neurovascularly intact in all extremities with 2+ DTRs and 2+ pulses.  Lymph: No lymphadenopathy of posterior or anterior cervical chain or axillae bilaterally.  Gait normal with good balance and coordination.  MSK:  Non tender with full range of motion  and good stability and symmetric strength and tone of shoulders, elbows, wrist, hip and ankles bilaterally.  Bilateral knee exam shows the patient does have some tender to palpation over the patella tendon.  No crepitus noted plan.  Near full range of motion. Back Exam:  Inspection: Unremarkable  Motion: Flexion 35 deg, Extension 25 deg, Side Bending to 25 deg bilaterally,  Rotation to 35 deg bilaterally  SLR laying: Negative  XSLR laying: Negative  Palpable tenderness: Tender to palpation of paraspinal musculature lumbar spine right greater than left. FABER: Positive Faber. Sensory change: Gross sensation intact to all lumbar and sacral dermatomes.  Reflexes: 2+ at both patellar tendons, 2+ at achilles tendons, Babinski's downgoing.  Strength at foot  Plantar-flexion: 5/5 Dorsi-flexion: 5/5 Eversion: 5/5 Inversion: 5/5  Leg strength  Quad: 5/5 Hamstring: 5/5 Hip flexor: 5/5 Hip abductors: 5/5  Gait unremarkable.  Limited musculoskeletal ultrasound was performed and interpreted by Lyndal Pulley  Left knee ultrasound shows the patient did have some mild hypoechoic changes at the origin of the patella tendon.  No true tearing appreciated.  No increase in Doppler flow.  Patient's knee otherwise was completely unremarkable  Osteopathic findings T3 extended rotated and side bent right inhaled third rib T9 extended rotated and side bent left L2 flexed rotated and side bent right Sacrum right on right    Impression and Recommendations:     This case required medical decision making of moderate complexity. The above documentation has been reviewed and is accurate and complete Lyndal Pulley, DO       Note: This dictation was prepared with Dragon dictation along with smaller phrase technology. Any transcriptional errors that result from this process are unintentional.

## 2018-08-20 DIAGNOSIS — E039 Hypothyroidism, unspecified: Secondary | ICD-10-CM | POA: Diagnosis not present

## 2018-08-20 DIAGNOSIS — R6882 Decreased libido: Secondary | ICD-10-CM | POA: Diagnosis not present

## 2018-08-20 DIAGNOSIS — E7800 Pure hypercholesterolemia, unspecified: Secondary | ICD-10-CM | POA: Diagnosis not present

## 2018-08-20 DIAGNOSIS — D509 Iron deficiency anemia, unspecified: Secondary | ICD-10-CM | POA: Diagnosis not present

## 2018-08-20 DIAGNOSIS — R7309 Other abnormal glucose: Secondary | ICD-10-CM | POA: Diagnosis not present

## 2018-08-20 DIAGNOSIS — N943 Premenstrual tension syndrome: Secondary | ICD-10-CM | POA: Diagnosis not present

## 2018-08-20 DIAGNOSIS — E079 Disorder of thyroid, unspecified: Secondary | ICD-10-CM | POA: Diagnosis not present

## 2018-08-20 DIAGNOSIS — Z131 Encounter for screening for diabetes mellitus: Secondary | ICD-10-CM | POA: Diagnosis not present

## 2018-08-20 DIAGNOSIS — Z1321 Encounter for screening for nutritional disorder: Secondary | ICD-10-CM | POA: Diagnosis not present

## 2018-08-20 MED FILL — ZOLPIDEM TARTRATE 10 MG TAB: 10 | 30 days supply | Qty: 30 | Fill #0

## 2018-09-03 ENCOUNTER — Other Ambulatory Visit: Payer: Self-pay

## 2018-09-03 ENCOUNTER — Other Ambulatory Visit: Payer: Self-pay | Admitting: Family Medicine

## 2018-09-03 MED ORDER — PENNSAID 2 % TD SOLN: 2.0000 g | Freq: Two times a day (BID) | TRANSDERMAL | 3 refills | Status: DC

## 2018-09-17 ENCOUNTER — Other Ambulatory Visit: Payer: Self-pay

## 2018-09-17 ENCOUNTER — Ambulatory Visit (INDEPENDENT_AMBULATORY_CARE_PROVIDER_SITE_OTHER): Payer: 59 | Admitting: Family Medicine

## 2018-09-17 ENCOUNTER — Encounter: Payer: Self-pay | Admitting: Family Medicine

## 2018-09-17 VITALS — BP 130/90 | HR 84 | Ht 65.0 in | Wt 208.0 lb

## 2018-09-17 DIAGNOSIS — M999 Biomechanical lesion, unspecified: Secondary | ICD-10-CM

## 2018-09-17 DIAGNOSIS — M546 Pain in thoracic spine: Secondary | ICD-10-CM | POA: Diagnosis not present

## 2018-09-17 DIAGNOSIS — G8929 Other chronic pain: Secondary | ICD-10-CM

## 2018-09-17 NOTE — Assessment & Plan Note (Signed)
Decision today to treat with OMT was based on Physical Exam  After verbal consent patient was treated with HVLA, ME, FPR techniques in rib, thoracic, lumbar and sacral areas  Patient tolerated the procedure well with improvement in symptoms  Patient given exercises, stretches and lifestyle modifications  See medications in patient instructions if given  Patient will follow up in 4-8 weeks

## 2018-09-17 NOTE — Progress Notes (Signed)
[name] [place], [place] [address] Phone: [phone] Subjective:    I Martha [name] am serving as a Education administrator for Dr. Hulan Saas.  CC: Neck and back pain follow-up, knee pain follow-up  [id number]  Martha [name] is a 38 y.o. female coming in with complaint of neck, knee and back pain. States that she is doing ok today. Sits at the computer all day.  Patient states that more tightness.  No weakness.  States mild pain on the knees bilaterally.      Past Medical History:  Diagnosis Date  . Asthma   . MS (multiple sclerosis) ([facility])   . PCOS (polycystic ovarian syndrome)   . Raynaud's disease    Past Surgical History:  Procedure Laterality Date  . TONSILECTOMY, ADENOIDECTOMY, BILATERAL MYRINGOTOMY AND TUBES  1986   Social History   Socioeconomic History  . Marital status: Married    Spouse name: Not on file  . Number of children: 0  . Years of education: Not on file  . Highest education level: Not on file  Occupational History  . Occupation: physicain  Social Needs  . Financial resource strain: Not on file  . Food insecurity    Worry: Not on file    Inability: Not on file  . Transportation needs    Medical: Not on file    Non-medical: Not on file  Tobacco Use  . Smoking status: Never Smoker  . Smokeless tobacco: Never Used  Substance and Sexual Activity  . Alcohol use: No  . Drug use: No  . Sexual activity: Not on file  Lifestyle  . Physical activity    Days per week: Not on file    Minutes per session: Not on file  . Stress: Not on file  Relationships  . Social Herbalist on phone: Not on file    Gets together: Not on file    Attends religious service: Not on file    Active member of club or organization: Not on file    Attends meetings of clubs or organizations: Not on file    Relationship status: Not on file  Other Topics Concern  . Not on file  Social History Narrative   Right handed   1-2 cups caffeine daily   Lives at home with husband   No Known Allergies Family History  Problem Relation Age of Onset  . Diabetes Mother   . Heart disease Mother   . Thyroid disease Mother   . Hypertension Mother   . Hypercholesterolemia Mother   . Migraines Mother   . Diabetes Father   . Thyroid disease Father   . Hyperlipidemia Father   . CAD Father   . Hypercholesterolemia Father   . Hypertension Father   . Breast cancer Paternal Aunt     Current Outpatient Medications (Endocrine & Metabolic):  .  estradiol (ESTRACE) 2 MG tablet, Take 2 mg by mouth 2 (two) times daily. Marland Kitchen  estradiol (VIVELLE-DOT) 0.1 MG/24HR patch, Place 0.1 mg onto the skin every 3 (three) days. .  metFORMIN (GLUCOPHAGE) 500 MG tablet, Take 1 tablet (500 mg total) by mouth daily with breakfast. (Patient taking differently: Take 1,000 mg by mouth 2 (two) times daily with a meal. ) .  methylPREDNISolone (MEDROL DOSEPAK) 4 MG TBPK tablet, follow package directions   Current Outpatient Medications (Respiratory):  .  loratadine (CLARITIN) 10 MG tablet, Take 10 mg by mouth daily as needed for allergies.  Current Outpatient Medications (Analgesics):  .  aspirin EC 81 MG tablet, Take 81 mg by mouth daily. .  meloxicam (MOBIC) 7.5 MG tablet, Take 2 tablets (15 mg total) by mouth daily as needed for pain. .  rizatriptan (MAXALT) 10 MG tablet, Take 1 tablet (10 mg total) by mouth as needed for migraine. May repeat in 2 hours if needed   Current Outpatient Medications (Other):  .  cyclobenzaprine (FLEXERIL) 5 MG tablet, Take 1 tablet (5 mg total) by mouth 3 (three) times daily as needed for muscle spasms. .  Diclofenac Sodium (PENNSAID) 2 % SOLN, Place 2 g onto the skin 2 (two) times daily. Marland Kitchen  esomeprazole (NEXIUM) 40 MG capsule, TAKE 1 CAPSULE BY MOUTH ONCE DAILY .  ondansetron (ZOFRAN ODT) 4 MG disintegrating tablet, Take 1 tablet (4 mg total) by mouth every 8 (eight) hours as needed for nausea or vomiting. .   Prenatal Vit-Fe Fumarate-FA (PRENATAL MULTIVITAMIN) TABS tablet, Take 1 tablet by mouth daily at 12 noon. Marland Kitchen  zolpidem (AMBIEN) 5 MG tablet, Take 5 mg by mouth as needed for sleep.     Past medical history, social, surgical and family history all reviewed in electronic medical record.  No pertanent information unless stated regarding to the chief complaint.   Review of Systems:  No headache, visual changes, nausea, vomiting, diarrhea, constipation, dizziness, abdominal pain, skin rash, fevers, chills, night sweats, weight loss, swollen lymph nodes, body aches, joint swelling,  chest pain, shortness of breath, mood changes.  Positive muscle aches  Objective  Blood pressure 130/90, pulse 84, height 5\' 5"  (1.651 m), weight 208 lb (94.3 kg), SpO2 98 %.   General: No apparent distress alert and oriented x3 mood and affect normal, dressed appropriately.  HEENT: Pupils equal, extraocular movements intact  Respiratory: Patient's speak in full sentences and does not appear short of breath  Cardiovascular: No lower extremity edema, non tender, no erythema  Skin: Warm dry intact with no signs of infection or rash on extremities or on axial skeleton.  Abdomen: Soft nontender  Neuro: Cranial nerves II through XII are intact, neurovascularly intact in all extremities with 2+ DTRs and 2+ pulses.  Lymph: No lymphadenopathy of posterior or anterior cervical chain or axillae bilaterally.  Gait normal with good balance and coordination.  MSK:  Non tender with full range of motion and good stability and symmetric strength and tone of shoulders, elbows, wrist, hip, knee and ankles bilaterally.  Neck: Inspection loss of lordosis . No palpable stepoffs. Negative Spurling's maneuver. Mild limited sidebending bilaterally of 5 degrees Grip strength and sensation normal in bilateral hands Strength good C4 to T1 distribution No sensory change to C4 to T1 Negative Hoffman sign bilaterally Reflexes normal  Back  Exam:  Inspection: Poor core strength Motion: Flexion 45 deg, Extension 25 deg, Side Bending to 45 deg bilaterally,  Rotation to 45 deg bilaterally  SLR laying: Negative  XSLR laying: Negative  Palpable tenderness: None. FABER: negative. Sensory change: Gross sensation intact to all lumbar and sacral dermatomes.  Reflexes: 2+ at both patellar tendons, 2+ at achilles tendons, Babinski's downgoing.  Strength at foot  Plantar-flexion: 5/5 Dorsi-flexion: 5/5 Eversion: 5/5 Inversion: 5/5  Leg strength  Quad: 5/5 Hamstring: 5/5 Hip flexor: 5/5 Hip abductors: 5/5  Gait unremarkable.  Osteopathic findings T3 extended rotated and side bent right inhaled third rib T5 extended rotated and side bent left L4 flexed rotated and side bent left  Sacrum right on right    Impression and  Recommendations:     This case required medical decision making of moderate complexity. The above documentation has been reviewed and is accurate and complete Lyndal Pulley, DO       Note: This dictation was prepared with Dragon dictation along with smaller phrase technology. Any transcriptional errors that result from this process are unintentional.

## 2018-09-17 NOTE — Assessment & Plan Note (Signed)
Pain.  As well.  Some mild scapular dyskinesis contributes.  Discussed posture.  Increase activity as tolerated.  Follow-up again 4 to 8 weeks

## 2018-11-07 ENCOUNTER — Encounter: Payer: Self-pay | Admitting: Family Medicine

## 2018-11-07 DIAGNOSIS — M25561 Pain in right knee: Secondary | ICD-10-CM

## 2018-11-24 DIAGNOSIS — E669 Obesity, unspecified: Secondary | ICD-10-CM | POA: Diagnosis not present

## 2018-11-24 DIAGNOSIS — G43909 Migraine, unspecified, not intractable, without status migrainosus: Secondary | ICD-10-CM | POA: Diagnosis not present

## 2018-11-24 DIAGNOSIS — Z713 Dietary counseling and surveillance: Secondary | ICD-10-CM | POA: Diagnosis not present

## 2018-11-24 DIAGNOSIS — N979 Female infertility, unspecified: Secondary | ICD-10-CM | POA: Diagnosis not present

## 2018-11-24 DIAGNOSIS — Z6833 Body mass index (BMI) 33.0-33.9, adult: Secondary | ICD-10-CM | POA: Diagnosis not present

## 2018-11-30 ENCOUNTER — Other Ambulatory Visit: Payer: Self-pay

## 2018-11-30 ENCOUNTER — Ambulatory Visit
Admission: RE | Admit: 2018-11-30 | Discharge: 2018-11-30 | Disposition: A | Discharge status: Home / Self Care | Payer: 59 | Source: Ambulatory Visit | Attending: Family Medicine | Admitting: Family Medicine

## 2018-11-30 DIAGNOSIS — M25561 Pain in right knee: Secondary | ICD-10-CM

## 2018-12-01 DIAGNOSIS — Z1321 Encounter for screening for nutritional disorder: Secondary | ICD-10-CM | POA: Diagnosis not present

## 2018-12-01 DIAGNOSIS — R7309 Other abnormal glucose: Secondary | ICD-10-CM | POA: Diagnosis not present

## 2018-12-01 DIAGNOSIS — E7800 Pure hypercholesterolemia, unspecified: Secondary | ICD-10-CM | POA: Diagnosis not present

## 2018-12-01 DIAGNOSIS — Z131 Encounter for screening for diabetes mellitus: Secondary | ICD-10-CM | POA: Diagnosis not present

## 2018-12-01 DIAGNOSIS — E079 Disorder of thyroid, unspecified: Secondary | ICD-10-CM | POA: Diagnosis not present

## 2018-12-01 DIAGNOSIS — E039 Hypothyroidism, unspecified: Secondary | ICD-10-CM | POA: Diagnosis not present

## 2018-12-01 DIAGNOSIS — E789 Disorder of lipoprotein metabolism, unspecified: Secondary | ICD-10-CM | POA: Diagnosis not present

## 2018-12-01 DIAGNOSIS — N943 Premenstrual tension syndrome: Secondary | ICD-10-CM | POA: Diagnosis not present

## 2018-12-01 DIAGNOSIS — R6882 Decreased libido: Secondary | ICD-10-CM | POA: Diagnosis not present

## 2018-12-02 ENCOUNTER — Ambulatory Visit (INDEPENDENT_AMBULATORY_CARE_PROVIDER_SITE_OTHER): Payer: 59 | Admitting: Family Medicine

## 2018-12-02 ENCOUNTER — Other Ambulatory Visit: Payer: Self-pay

## 2018-12-02 ENCOUNTER — Encounter: Payer: Self-pay | Admitting: Family Medicine

## 2018-12-02 DIAGNOSIS — M546 Pain in thoracic spine: Secondary | ICD-10-CM | POA: Diagnosis not present

## 2018-12-02 DIAGNOSIS — G8929 Other chronic pain: Secondary | ICD-10-CM

## 2018-12-02 DIAGNOSIS — M1711 Unilateral primary osteoarthritis, right knee: Secondary | ICD-10-CM

## 2018-12-02 DIAGNOSIS — M999 Biomechanical lesion, unspecified: Secondary | ICD-10-CM

## 2018-12-02 MED FILL — ZOLPIDEM TARTRATE 10 MG TAB: 10 | 30 days supply | Qty: 30 | Fill #1

## 2018-12-02 NOTE — Assessment & Plan Note (Signed)
Discussed injections, Visco supplementation, bracing.  Patient will consider all

## 2018-12-02 NOTE — Progress Notes (Signed)
[name] [address], [place] [address] Phone: [phone] Subjective:    I'm seeing this patient by the request  of:    CC: Back pain follow-up, knee pain follow-up  [id number]  Martha [name] is a 38 y.o. female coming in with complaint of back pain,.  History of multiple sclerosis.  Has responded fairly well to osteopathic manipulation.  Some mild tightness.  Has been out of work for a little bit  Patient was having right knee pain.  Did not seem to be improving.  Did have an MRI of the knee.  Found to have focal arthritic changes of the patellofemoral area as well as a degenerative meniscal tear with no significant displacement though.  This was independently visualized by me.     Past Medical History:  Diagnosis Date  . Asthma   . MS (multiple sclerosis) ([place])   . PCOS (polycystic ovarian syndrome)   . Raynaud's disease    Past Surgical History:  Procedure Laterality Date  . TONSILECTOMY, ADENOIDECTOMY, BILATERAL MYRINGOTOMY AND TUBES  1986   Social History   Socioeconomic History  . Marital status: Married    Spouse name: Not on file  . Number of children: 0  . Years of education: Not on file  . Highest education level: Not on file  Occupational History  . Occupation: physicain  Social Needs  . Financial resource strain: Not on file  . Food insecurity    Worry: Not on file    Inability: Not on file  . Transportation needs    Medical: Not on file    Non-medical: Not on file  Tobacco Use  . Smoking status: Never Smoker  . Smokeless tobacco: Never Used  Substance and Sexual Activity  . Alcohol use: No  . Drug use: No  . Sexual activity: Not on file  Lifestyle  . Physical activity    Days per week: Not on file    Minutes per session: Not on file  . Stress: Not on file  Relationships  . Social Herbalist on phone: Not on file    Gets together: Not on file    Attends religious service: Not on file     Active member of club or organization: Not on file    Attends meetings of clubs or organizations: Not on file    Relationship status: Not on file  Other Topics Concern  . Not on file  Social History Narrative   Right handed   1-2 cups caffeine daily   Lives at home with husband   No Known Allergies Family History  Problem Relation Age of Onset  . Diabetes Mother   . Heart disease Mother   . Thyroid disease Mother   . Hypertension Mother   . Hypercholesterolemia Mother   . Migraines Mother   . Diabetes Father   . Thyroid disease Father   . Hyperlipidemia Father   . CAD Father   . Hypercholesterolemia Father   . Hypertension Father   . Breast cancer Paternal Aunt     Current Outpatient Medications (Endocrine & Metabolic):  .  estradiol (ESTRACE) 2 MG tablet, Take 2 mg by mouth 2 (two) times daily. Marland Kitchen  estradiol (VIVELLE-DOT) 0.1 MG/24HR patch, Place 0.1 mg onto the skin every 3 (three) days. .  metFORMIN (GLUCOPHAGE) 500 MG tablet, Take 1 tablet (500 mg total) by mouth daily with breakfast. (Patient taking differently: Take 1,000 mg by mouth 2 (two)  times daily with a meal. ) .  methylPREDNISolone (MEDROL DOSEPAK) 4 MG TBPK tablet, follow package directions   Current Outpatient Medications (Respiratory):  .  loratadine (CLARITIN) 10 MG tablet, Take 10 mg by mouth daily as needed for allergies.  Current Outpatient Medications (Analgesics):  .  aspirin EC 81 MG tablet, Take 81 mg by mouth daily. .  meloxicam (MOBIC) 7.5 MG tablet, Take 2 tablets (15 mg total) by mouth daily as needed for pain. .  rizatriptan (MAXALT) 10 MG tablet, Take 1 tablet (10 mg total) by mouth as needed for migraine. May repeat in 2 hours if needed   Current Outpatient Medications (Other):  .  cyclobenzaprine (FLEXERIL) 5 MG tablet, Take 1 tablet (5 mg total) by mouth 3 (three) times daily as needed for muscle spasms. .  Diclofenac Sodium (PENNSAID) 2 % SOLN, Place 2 g onto the skin 2 (two) times  daily. Marland Kitchen  esomeprazole (NEXIUM) 40 MG capsule, TAKE 1 CAPSULE BY MOUTH ONCE DAILY .  ondansetron (ZOFRAN ODT) 4 MG disintegrating tablet, Take 1 tablet (4 mg total) by mouth every 8 (eight) hours as needed for nausea or vomiting. .  Prenatal Vit-Fe Fumarate-FA (PRENATAL MULTIVITAMIN) TABS tablet, Take 1 tablet by mouth daily at 12 noon. Marland Kitchen  zolpidem (AMBIEN) 5 MG tablet, Take 5 mg by mouth as needed for sleep.     Past medical history, social, surgical and family history all reviewed in electronic medical record.  No pertanent information unless stated regarding to the chief complaint.   Review of Systems:  No headache, visual changes, nausea, vomiting, diarrhea, constipation, dizziness, abdominal pain, skin rash, fevers, chills, night sweats, weight loss, swollen lymph nodes, body aches, joint swelling,  chest pain, shortness of breath, mood changes.  Positive muscle aches  Objective  There were no vitals taken for this visit.    General: No apparent distress alert and oriented x3 mood and affect normal, dressed appropriately.  HEENT: Pupils equal, extraocular movements intact  Respiratory: Patient's speak in full sentences and does not appear short of breath  Cardiovascular: No lower extremity edema, non tender, no erythema  Skin: Warm dry intact with no signs of infection or rash on extremities or on axial skeleton.  Abdomen: Soft nontender  Neuro: Cranial nerves II through XII are intact, neurovascularly intact in all extremities with 2+ DTRs and 2+ pulses.  Lymph: No lymphadenopathy of posterior or anterior cervical chain or axillae bilaterally.  Gait normal with good balance and coordination.  MSK:  tender with full range of motion and good stability and symmetric strength and tone of shoulders, elbows, wrist, hip, and ankles bilaterally.  Right knee exam does have a positive patella grind.  Patient tender to palpation over the medial joint space.  Negative McMurray's.  Back exam  does have some loss of lordosis.  Tender over the thoracolumbar juncture right greater than left.  Tender to palpation also in the sacroiliac bilaterally  Osteopathic findings  T3 extended rotated and side bent right inhaled third rib T9 extended rotated and side bent right L2 flexed rotated and side bent right Sacrum right on right     Impression and Recommendations:     This case required medical decision making of moderate complexity. The above documentation has been reviewed and is accurate and complete Lyndal Pulley, DO       Note: This dictation was prepared with Dragon dictation along with smaller phrase technology. Any transcriptional errors that result from this process are unintentional.

## 2018-12-02 NOTE — Assessment & Plan Note (Signed)
Stable overall, muscle imbalances, discussed icing regimen and home exercises.  Patient will follow-up with me again in 4 to 8 weeks

## 2018-12-02 NOTE — Assessment & Plan Note (Signed)
Decision today to treat with OMT was based on Physical Exam  After verbal consent patient was treated with HVLA, ME, FPR techniques in  thoracic, lumbar and sacral areas  Patient tolerated the procedure well with improvement in symptoms  Patient given exercises, stretches and lifestyle modifications  See medications in patient instructions if given  Patient will follow up in 4-8 weeks 

## 2018-12-05 ENCOUNTER — Ambulatory Visit
Admission: RE | Admit: 2018-12-05 | Discharge: 2018-12-05 | Disposition: A | Discharge status: Home / Self Care | Payer: 59 | Source: Ambulatory Visit | Attending: Neurology | Admitting: Neurology

## 2018-12-05 ENCOUNTER — Other Ambulatory Visit: Payer: Self-pay | Admitting: Neurology

## 2018-12-05 ENCOUNTER — Other Ambulatory Visit: Payer: Self-pay

## 2018-12-05 DIAGNOSIS — G35 Multiple sclerosis: Secondary | ICD-10-CM

## 2018-12-05 MED ORDER — GADOBENATE DIMEGLUMINE 529 MG/ML IV SOLN
18.0000 mL | Freq: Once | INTRAVENOUS | Status: AC | PRN
  Administered 2018-12-05: 18 mL via INTRAVENOUS

## 2018-12-11 DIAGNOSIS — G35 Multiple sclerosis: Secondary | ICD-10-CM | POA: Diagnosis not present

## 2018-12-25 DIAGNOSIS — L439 Lichen planus, unspecified: Secondary | ICD-10-CM | POA: Diagnosis not present

## 2018-12-25 DIAGNOSIS — D485 Neoplasm of uncertain behavior of skin: Secondary | ICD-10-CM | POA: Diagnosis not present

## 2018-12-25 DIAGNOSIS — D224 Melanocytic nevi of scalp and neck: Secondary | ICD-10-CM | POA: Diagnosis not present

## 2018-12-25 DIAGNOSIS — D2272 Melanocytic nevi of left lower limb, including hip: Secondary | ICD-10-CM | POA: Diagnosis not present

## 2018-12-25 DIAGNOSIS — L219 Seborrheic dermatitis, unspecified: Secondary | ICD-10-CM | POA: Diagnosis not present

## 2018-12-25 DIAGNOSIS — L814 Other melanin hyperpigmentation: Secondary | ICD-10-CM | POA: Diagnosis not present

## 2018-12-25 DIAGNOSIS — D1801 Hemangioma of skin and subcutaneous tissue: Secondary | ICD-10-CM | POA: Diagnosis not present

## 2018-12-25 DIAGNOSIS — L304 Erythema intertrigo: Secondary | ICD-10-CM | POA: Diagnosis not present

## 2018-12-25 DIAGNOSIS — L608 Other nail disorders: Secondary | ICD-10-CM | POA: Diagnosis not present

## 2018-12-25 MED FILL — NYSTATIN 100,000 UNIT/GM CR: 100000 | 30 days supply | Qty: 30 | Fill #0

## 2018-12-26 MED FILL — MELOXICAM 7.5 MG TABLET: 7.5 | 15 days supply | Qty: 30 | Fill #1

## 2018-12-30 DIAGNOSIS — K219 Gastro-esophageal reflux disease without esophagitis: Secondary | ICD-10-CM | POA: Diagnosis not present

## 2018-12-30 DIAGNOSIS — K582 Mixed irritable bowel syndrome: Secondary | ICD-10-CM | POA: Diagnosis not present

## 2018-12-30 MED FILL — BETAMETHASONE VALER 0.1% OI: 0.1 | 14 days supply | Qty: 15 | Fill #0

## 2019-01-02 MED FILL — ZOLPIDEM TARTRATE 10 MG TAB: 10 | 30 days supply | Qty: 30 | Fill #2

## 2019-01-07 MED FILL — ESTRADIOL 2 MG TABLET: 2 | 30 days supply | Qty: 60 | Fill #0

## 2019-01-07 MED FILL — BD NEEDLES 22GX1.5: 22G X 1-1/2 | 30 days supply | Qty: 30 | Fill #0

## 2019-01-07 MED FILL — ESTRADIOL 0.1 MG PATCH: 0.1 | 28 days supply | Qty: 8 | Fill #0

## 2019-01-07 MED FILL — BD NEEDLES 22GX1.5": 22G X 1-1/2 | 30 days supply | Qty: 30 | Fill #0

## 2019-01-07 MED FILL — BD 3 ML SYRINGE WITH NEEDLE: 22G X 1-1/2 | 30 days supply | Qty: 30 | Fill #0

## 2019-01-07 MED FILL — METHYLPREDNISOLONE 4 MG TAB: 4 | 4 days supply | Qty: 16 | Fill #0

## 2019-01-07 MED FILL — PROGESTERONE OIL 50 MG/ML V: 50 | 30 days supply | Qty: 30 | Fill #0

## 2019-02-04 DIAGNOSIS — Z3141 Encounter for fertility testing: Secondary | ICD-10-CM | POA: Diagnosis not present

## 2019-02-04 DIAGNOSIS — N8500 Endometrial hyperplasia, unspecified: Secondary | ICD-10-CM | POA: Diagnosis not present

## 2019-02-04 MED FILL — DOXYCYCLINE HYCLATE 100 MG: 100 | 5 days supply | Qty: 10 | Fill #0

## 2019-03-04 ENCOUNTER — Other Ambulatory Visit: Payer: Self-pay

## 2019-03-04 ENCOUNTER — Ambulatory Visit (INDEPENDENT_AMBULATORY_CARE_PROVIDER_SITE_OTHER): Payer: 59 | Admitting: Family Medicine

## 2019-03-04 ENCOUNTER — Encounter: Payer: Self-pay | Admitting: Family Medicine

## 2019-03-04 VITALS — BP 120/80 | Ht 65.0 in | Wt 207.0 lb

## 2019-03-04 DIAGNOSIS — G8929 Other chronic pain: Secondary | ICD-10-CM | POA: Diagnosis not present

## 2019-03-04 DIAGNOSIS — M546 Pain in thoracic spine: Secondary | ICD-10-CM

## 2019-03-04 DIAGNOSIS — M999 Biomechanical lesion, unspecified: Secondary | ICD-10-CM

## 2019-03-04 MED FILL — VALACYCLOVIR HCL 500 MG TAB: 500 | 2 days supply | Qty: 8 | Fill #0

## 2019-03-04 NOTE — Progress Notes (Signed)
[facility] [address] [place] Phone: [phone] Subjective:   I Martha [name] am serving as a Education administrator for Dr. Hulan Saas.  This visit occurred during the SARS-CoV-2 public health emergency.  Safety protocols were in place, including screening questions prior to the visit, additional usage of staff PPE, and extensive cleaning of exam room while observing appropriate contact time as indicated for disinfecting solutions.     CC: Thoracic back pain follow-up  [id number]  Martha [name] is a 38 y.o. female coming in with complaint of back and knee pain. Patient states she is ready for OMT. States she feels something is out. Patient has been working quite significant amount of time    Past Medical History:  Diagnosis Date  . Asthma   . MS (multiple sclerosis) ([place])   . PCOS (polycystic ovarian syndrome)   . Raynaud's disease    Past Surgical History:  Procedure Laterality Date  . TONSILECTOMY, ADENOIDECTOMY, BILATERAL MYRINGOTOMY AND TUBES  1986   Social History   Socioeconomic History  . Marital status: Married    Spouse name: Not on file  . Number of children: 0  . Years of education: Not on file  . Highest education level: Not on file  Occupational History  . Occupation: physicain  Tobacco Use  . Smoking status: Never Smoker  . Smokeless tobacco: Never Used  Substance and Sexual Activity  . Alcohol use: No  . Drug use: No  . Sexual activity: Not on file  Other Topics Concern  . Not on file  Social History Narrative   Right handed   1-2 cups caffeine daily   Lives at home with husband   Social Determinants of Health   Financial Resource Strain:   . Difficulty of Paying Living Expenses: Not on file  Food Insecurity:   . Worried About Charity fundraiser in the Last Year: Not on file  . Ran Out of Food in the Last Year: Not on file  Transportation Needs:   . Lack of Transportation (Medical): Not on file   . Lack of Transportation (Non-Medical): Not on file  Physical Activity:   . Days of Exercise per Week: Not on file  . Minutes of Exercise per Session: Not on file  Stress:   . Feeling of Stress : Not on file  Social Connections:   . Frequency of Communication with Friends and Family: Not on file  . Frequency of Social Gatherings with Friends and Family: Not on file  . Attends Religious Services: Not on file  . Active Member of Clubs or Organizations: Not on file  . Attends Archivist Meetings: Not on file  . Marital Status: Not on file   No Known Allergies Family History  Problem Relation Age of Onset  . Diabetes Mother   . Heart disease Mother   . Thyroid disease Mother   . Hypertension Mother   . Hypercholesterolemia Mother   . Migraines Mother   . Diabetes Father   . Thyroid disease Father   . Hyperlipidemia Father   . CAD Father   . Hypercholesterolemia Father   . Hypertension Father   . Breast cancer Paternal Aunt     Current Outpatient Medications (Endocrine & Metabolic):  .  estradiol (ESTRACE) 2 MG tablet, Take 2 mg by mouth 2 (two) times daily. Marland Kitchen  estradiol (VIVELLE-DOT) 0.1 MG/24HR patch, Place 0.1 mg onto the skin every 3 (three) days. .  metFORMIN (GLUCOPHAGE)  500 MG tablet, Take 1 tablet (500 mg total) by mouth daily with breakfast. (Patient taking differently: Take 1,000 mg by mouth 2 (two) times daily with a meal. ) .  methylPREDNISolone (MEDROL DOSEPAK) 4 MG TBPK tablet, follow package directions   Current Outpatient Medications (Respiratory):  .  loratadine (CLARITIN) 10 MG tablet, Take 10 mg by mouth daily as needed for allergies.  Current Outpatient Medications (Analgesics):  .  aspirin EC 81 MG tablet, Take 81 mg by mouth daily. .  meloxicam (MOBIC) 7.5 MG tablet, Take 2 tablets (15 mg total) by mouth daily as needed for pain. .  rizatriptan (MAXALT) 10 MG tablet, Take 1 tablet (10 mg total) by mouth as needed for migraine. May repeat in 2  hours if needed   Current Outpatient Medications (Other):  .  cyclobenzaprine (FLEXERIL) 5 MG tablet, Take 1 tablet (5 mg total) by mouth 3 (three) times daily as needed for muscle spasms. .  Diclofenac Sodium (PENNSAID) 2 % SOLN, Place 2 g onto the skin 2 (two) times daily. Marland Kitchen  esomeprazole (NEXIUM) 40 MG capsule, TAKE 1 CAPSULE BY MOUTH ONCE DAILY .  ondansetron (ZOFRAN ODT) 4 MG disintegrating tablet, Take 1 tablet (4 mg total) by mouth every 8 (eight) hours as needed for nausea or vomiting. .  Prenatal Vit-Fe Fumarate-FA (PRENATAL MULTIVITAMIN) TABS tablet, Take 1 tablet by mouth daily at 12 noon. Marland Kitchen  zolpidem (AMBIEN) 5 MG tablet, Take 5 mg by mouth as needed for sleep.     Past medical history, social, surgical and family history all reviewed in electronic medical record.  No pertanent information unless stated regarding to the chief complaint.   Review of Systems:  No headache, visual changes, nausea, vomiting, diarrhea, constipation, dizziness, abdominal pain, skin rash, fevers, chills, night sweats, weight loss, swollen lymph nodes, body aches, joint swelling, muscle aches, chest pain, shortness of breath, mood changes.   Objective  Blood pressure 120/80, height 5\' 5"  (1.651 m), weight 207 lb (93.9 kg).    General: No apparent distress alert and oriented x3 mood and affect normal, dressed appropriately.  HEENT: Pupils equal, extraocular movements intact  Respiratory: Patient's speak in full sentences and does not appear short of breath  Cardiovascular: No lower extremity edema, non tender, no erythema  Skin: Warm dry intact with no signs of infection or rash on extremities or on axial skeleton.  Abdomen: Soft nontender  Neuro: Cranial nerves II through XII are intact, neurovascularly intact in all extremities with 2+ DTRs and 2+ pulses.  Lymph: No lymphadenopathy of posterior or anterior cervical chain or axillae bilaterally.  Gait normal with good balance and coordination.   MSK:  Non tender with full range of motion and good stability and symmetric strength and tone of shoulders, elbows, wrist, hip, knee and ankles bilaterally.  Mild hypermobility noted.  Mild discomfort in the knees bilaterally Upper back exam does have some increasing mild kyphosis.  Patient is tender to palpation in the parascapular region right greater than left.  Patient's neck negative Spurling's.  Near full range of motion of the lumbar spine.  More pain in the thoracolumbar juncture than usual.  Osteopathic findings  T3 extended rotated and side bent right inhaled third rib T11 extended rotated and side bent left L2 flexed rotated and side bent right Sacrum right on right     Impression and Recommendations:     This case required medical decision making of moderate complexity. The above documentation has been reviewed and is  accurate and complete Lyndal Pulley, DO       Note: This dictation was prepared with Dragon dictation along with smaller phrase technology. Any transcriptional errors that result from this process are unintentional.

## 2019-03-04 NOTE — Patient Instructions (Signed)
Happy New Year!  See me again in 5-6 weeks

## 2019-03-04 NOTE — Assessment & Plan Note (Signed)
Worsening thoracic back pain.  Has responded fairly well to manipulation.  Discussed continuing to home exercises, icing regimen, which activities to potentially avoid.  Follow-up again 6 weeks.

## 2019-03-04 NOTE — Assessment & Plan Note (Signed)
Decision today to treat with OMT was based on Physical Exam  After verbal consent patient was treated with HVLA, ME, FPR techniques in rib, thoracic, lumbar and sacral areas  Patient tolerated the procedure well with improvement in symptoms  Patient given exercises, stretches and lifestyle modifications  See medications in patient instructions if given  Patient will follow up in 6 weeks

## 2019-03-25 DIAGNOSIS — Z3200 Encounter for pregnancy test, result unknown: Secondary | ICD-10-CM | POA: Diagnosis not present

## 2019-03-26 MED FILL — ULTICARE INS SYR 1 ML 28GX1: 28G X 1/2" | 30 days supply | Qty: 60 | Fill #0

## 2019-03-26 MED FILL — HEPARIN SOD 5,000 UNIT/ML V: 5000 | 30 days supply | Qty: 60 | Fill #0

## 2019-04-06 DIAGNOSIS — Z3201 Encounter for pregnancy test, result positive: Secondary | ICD-10-CM | POA: Diagnosis not present

## 2019-04-15 ENCOUNTER — Ambulatory Visit: Payer: 59 | Admitting: Family Medicine

## 2019-04-15 DIAGNOSIS — Z3200 Encounter for pregnancy test, result unknown: Secondary | ICD-10-CM | POA: Diagnosis not present

## 2019-04-27 DIAGNOSIS — O2621 Pregnancy care for patient with recurrent pregnancy loss, first trimester: Secondary | ICD-10-CM | POA: Diagnosis not present

## 2019-04-28 MED FILL — miSOPROStol 200 MCG TABS: 200 | 1 days supply | Qty: 4 | Fill #0

## 2019-04-28 MED FILL — traMADol HCL 50 MG TABS: 50 | 2 days supply | Qty: 10 | Fill #0

## 2019-04-29 MED FILL — miSOPROStol 200 MCG TABS: 200 | 1 days supply | Qty: 4 | Fill #1

## 2019-04-30 DIAGNOSIS — O021 Missed abortion: Secondary | ICD-10-CM | POA: Diagnosis not present

## 2019-04-30 DIAGNOSIS — N96 Recurrent pregnancy loss: Secondary | ICD-10-CM | POA: Diagnosis not present

## 2019-05-04 DIAGNOSIS — O032 Embolism following incomplete spontaneous abortion: Secondary | ICD-10-CM | POA: Diagnosis not present

## 2019-05-11 DIAGNOSIS — Z3200 Encounter for pregnancy test, result unknown: Secondary | ICD-10-CM | POA: Diagnosis not present

## 2019-05-11 DIAGNOSIS — Z113 Encounter for screening for infections with a predominantly sexual mode of transmission: Secondary | ICD-10-CM | POA: Diagnosis not present

## 2019-05-11 DIAGNOSIS — Z3183 Encounter for assisted reproductive fertility procedure cycle: Secondary | ICD-10-CM | POA: Diagnosis not present

## 2019-05-11 DIAGNOSIS — O2621 Pregnancy care for patient with recurrent pregnancy loss, first trimester: Secondary | ICD-10-CM | POA: Diagnosis not present

## 2019-05-18 DIAGNOSIS — O034 Incomplete spontaneous abortion without complication: Secondary | ICD-10-CM | POA: Diagnosis not present

## 2019-05-18 DIAGNOSIS — N856 Intrauterine synechiae: Secondary | ICD-10-CM | POA: Diagnosis not present

## 2019-05-18 DIAGNOSIS — O021 Missed abortion: Secondary | ICD-10-CM | POA: Diagnosis not present

## 2019-05-18 MED FILL — MEDROXYPROGESTERONE 10 MG T: 10 | 5 days supply | Qty: 5 | Fill #0

## 2019-05-18 MED FILL — ESTRADIOL 2 MG TABS: 2 | 30 days supply | Qty: 60 | Fill #0

## 2019-05-27 ENCOUNTER — Ambulatory Visit: Payer: 59 | Admitting: Family Medicine

## 2019-05-28 ENCOUNTER — Ambulatory Visit (INDEPENDENT_AMBULATORY_CARE_PROVIDER_SITE_OTHER): Payer: 59 | Admitting: Family Medicine

## 2019-05-28 ENCOUNTER — Encounter: Payer: Self-pay | Admitting: Family Medicine

## 2019-05-28 ENCOUNTER — Other Ambulatory Visit: Payer: Self-pay

## 2019-05-28 VITALS — BP 100/62 | Ht 65.0 in | Wt 206.0 lb

## 2019-05-28 DIAGNOSIS — M999 Biomechanical lesion, unspecified: Secondary | ICD-10-CM | POA: Diagnosis not present

## 2019-05-28 DIAGNOSIS — G8929 Other chronic pain: Secondary | ICD-10-CM

## 2019-05-28 DIAGNOSIS — M546 Pain in thoracic spine: Secondary | ICD-10-CM | POA: Diagnosis not present

## 2019-05-28 NOTE — Progress Notes (Signed)
[facility] Martha [name] Phone: [phone] Subjective:   Martha Martha [name], am serving as a scribe for Dr. Hulan Saas. This visit occurred during the SARS-CoV-2 public health emergency.  Safety protocols were in place, including screening questions prior to the visit, additional usage of staff PPE, and extensive cleaning of exam room while observing appropriate contact time as indicated for disinfecting solutions.   CC: low back pain   [id number]  Martha Martha [name] is a 39 y.o. female coming in with complaint of back pain. Last seen on 03/04/2019 for OMT. Patient states that 2 weeks ago she pulled a muscle on lower left side of body. Was unable to stand up straight. Pain has subsided.  There is a chronic problem with mild exacerbation.  Patient has been doing some home exercises but still having some difficulty overall.  Patient states is more tightness than usual.  Has responded well to manipulation previously.  Takes meloxicam as needed  Twisted left knee getting out of car. Pain throughout joint. Pain has subsided though.      Past Medical History:  Diagnosis Date  . Asthma   . MS (multiple sclerosis) ([place])   . PCOS (polycystic ovarian syndrome)   . Raynaud's disease    Past Surgical History:  Procedure Laterality Date  . TONSILECTOMY, ADENOIDECTOMY, BILATERAL MYRINGOTOMY AND TUBES  1986   Social History   Socioeconomic History  . Marital status: Married    Spouse name: Not on file  . Number of children: 0  . Years of education: Not on file  . Highest education level: Not on file  Occupational History  . Occupation: Martha Martha [name]  Tobacco Use  . Smoking status: Never Smoker  . Smokeless tobacco: Never Used  Substance and Sexual Activity  . Alcohol use: Martha [name]  . Drug use: Martha [name]  . Sexual activity: Not on file  Other Topics Concern  . Not on file  Social History Narrative   Right handed   1-2 cups caffeine daily   Lives at home with husband   Social Determinants of Health   Financial Resource Strain:   . Difficulty of Paying Living Expenses:   Food Insecurity:   . Worried About Charity fundraiser in the Last Year:   . Arboriculturist in the Last Year:   Transportation Needs:   . Film/video editor (Medical):   Marland Kitchen Lack of Transportation (Non-Medical):   Physical Activity:   . Days of Exercise per Week:   . Minutes of Exercise per Session:   Stress:   . Feeling of Stress :   Social Connections:   . Frequency of Communication with Friends and Family:   . Frequency of Social Gatherings with Friends and Family:   . Attends Religious Services:   . Active Member of Clubs or Organizations:   . Attends Archivist Meetings:   Marland Kitchen Marital Status:    Martha [name] Known Allergies Family History  Problem Relation Age of Onset  . Diabetes Mother   . Heart disease Mother   . Thyroid disease Mother   . Hypertension Mother   . Hypercholesterolemia Mother   . Migraines Mother   . Diabetes Father   . Thyroid disease Father   . Hyperlipidemia Father   . CAD Father   . Hypercholesterolemia Father   . Hypertension Father   . Breast cancer Paternal Aunt     Current Outpatient Medications (Endocrine & Metabolic):  .  estradiol (ESTRACE) 2 MG tablet, Take 2 mg by mouth 2 (two) times daily. Marland Kitchen  estradiol (VIVELLE-DOT) 0.1 MG/24HR patch, Place 0.1 mg onto the skin every 3 (three) days. .  metFORMIN (GLUCOPHAGE) 500 MG tablet, Take 1 tablet (500 mg total) by mouth daily with breakfast. (Patient taking differently: Take 1,000 mg by mouth 2 (two) times daily with a meal. ) .  methylPREDNISolone (MEDROL DOSEPAK) 4 MG TBPK tablet, follow package directions   Current Outpatient Medications (Respiratory):  .  loratadine (CLARITIN) 10 MG tablet, Take 10 mg by mouth daily as needed for allergies.  Current Outpatient Medications (Analgesics):  .  aspirin EC 81 MG tablet, Take 81 mg by mouth daily. .   meloxicam (MOBIC) 7.5 MG tablet, Take 2 tablets (15 mg total) by mouth daily as needed for pain. .  rizatriptan (MAXALT) 10 MG tablet, Take 1 tablet (10 mg total) by mouth as needed for migraine. May repeat in 2 hours if needed   Current Outpatient Medications (Other):  .  cyclobenzaprine (FLEXERIL) 5 MG tablet, Take 1 tablet (5 mg total) by mouth 3 (three) times daily as needed for muscle spasms. .  Diclofenac Sodium (PENNSAID) 2 % SOLN, Place 2 g onto the skin 2 (two) times daily. Marland Kitchen  esomeprazole (NEXIUM) 40 MG capsule, TAKE 1 CAPSULE BY MOUTH ONCE DAILY .  ondansetron (ZOFRAN ODT) 4 MG disintegrating tablet, Take 1 tablet (4 mg total) by mouth every 8 (eight) hours as needed for nausea or vomiting. .  Prenatal Vit-Fe Fumarate-FA (PRENATAL MULTIVITAMIN) TABS tablet, Take 1 tablet by mouth daily at 12 noon. Marland Kitchen  zolpidem (AMBIEN) 5 MG tablet, Take 5 mg by mouth as needed for sleep.    Reviewed prior external information including notes and imaging from  primary care provider As well as notes that were available from care everywhere and other healthcare systems.  Past medical history, social, surgical and family history all reviewed in electronic medical record.  Martha [name] pertanent information unless stated regarding to the chief complaint.   Review of Systems:  Martha [name] headache, visual changes, nausea, vomiting, diarrhea, constipation, dizziness, abdominal pain, skin rash, fevers, chills, night sweats, weight loss, swollen lymph nodes, body aches, joint swelling, chest pain, shortness of breath, mood changes. POSITIVE muscle aches  Objective  Blood pressure 100/62, height 5\' 5"  (1.651 m), weight 206 lb (93.4 kg).   General: Martha [name] apparent distress alert and oriented x3 mood and affect normal, dressed appropriately.  HEENT: Pupils equal, extraocular movements intact  Respiratory: Patient's speak in full sentences and does not appear short of breath  Cardiovascular: Martha [name] lower extremity edema, non tender,  Martha [name] erythema  Neuro: Cranial nerves II through XII are intact, neurovascularly intact in all extremities with 2+ DTRs and 2+ pulses.  Gait normal with good balance and coordination.  MSK:  Non tender with full range of motion and good stability and symmetric strength and tone of shoulders, elbows, wrist, hip, knee and ankles bilaterally.  Mild laxity of multiple joints  Low back exam does have some tender to palpation more in the thoracolumbar junction.  Mild over the sacroiliac joint right greater than left.  Negative straight leg test.  Osteopathic findings  T3 extended rotated and side bent right inhaled third rib T7 extended rotated and side bent left L2 flexed rotated and side bent right Sacrum right on right     Impression and Recommendations:     This case required medical decision making of moderate complexity. The above documentation has  been reviewed and is accurate and complete Lyndal Pulley, DO       Note: This dictation was prepared with Dragon dictation along with smaller phrase technology. Any transcriptional errors that result from this process are unintentional.

## 2019-05-28 NOTE — Patient Instructions (Signed)
[phone] See me in 5 weeks

## 2019-05-28 NOTE — Assessment & Plan Note (Signed)
   Decision today to treat with OMT was based on Physical Exam  After verbal consent patient was treated with HVLA, ME, FPR techniques in  thoracic, rib, lumbar and sacral areas, all areas are chronic   Patient tolerated the procedure well with improvement in symptoms  Patient given exercises, stretches and lifestyle modifications  See medications in patient instructions if given  Patient will follow up in 4-8 weeks 

## 2019-05-28 NOTE — Assessment & Plan Note (Signed)
Chronic problem : mild exacerbation   interventions previously, including medication management: meloxicam OMT   Interventions this visit: meloxicam encouraged as needed, OMT  We discussed with patient the importance ergonomics, home exercises, icing regimen, and over-the-counter natural products.     Return to clinic: 4 weeks

## 2019-05-29 DIAGNOSIS — K582 Mixed irritable bowel syndrome: Secondary | ICD-10-CM | POA: Diagnosis not present

## 2019-05-29 DIAGNOSIS — K219 Gastro-esophageal reflux disease without esophagitis: Secondary | ICD-10-CM | POA: Diagnosis not present

## 2019-06-10 ENCOUNTER — Other Ambulatory Visit: Payer: Self-pay | Admitting: Neurology

## 2019-06-10 MED ORDER — ONDANSETRON 4 MG PO TBDP: 4.0000 mg | ORAL_TABLET | Freq: Three times a day (TID) | ORAL | 3 refills | Status: DC | PRN

## 2019-06-10 MED FILL — ONDANSETRON ODT 4MG TBDP: 4 | 5 days supply | Qty: 30 | Fill #0

## 2019-06-24 DIAGNOSIS — Z3141 Encounter for fertility testing: Secondary | ICD-10-CM | POA: Diagnosis not present

## 2019-06-24 MED FILL — DOXYCYCLINE HYCLATE 100 MG: 100 | 5 days supply | Qty: 10 | Fill #0

## 2019-06-29 ENCOUNTER — Encounter: Payer: Self-pay | Admitting: Family Medicine

## 2019-06-29 ENCOUNTER — Other Ambulatory Visit: Payer: Self-pay

## 2019-06-29 ENCOUNTER — Ambulatory Visit (INDEPENDENT_AMBULATORY_CARE_PROVIDER_SITE_OTHER): Payer: 59 | Admitting: Family Medicine

## 2019-06-29 DIAGNOSIS — G8929 Other chronic pain: Secondary | ICD-10-CM

## 2019-06-29 DIAGNOSIS — M999 Biomechanical lesion, unspecified: Secondary | ICD-10-CM

## 2019-06-29 DIAGNOSIS — M546 Pain in thoracic spine: Secondary | ICD-10-CM

## 2019-06-29 MED ORDER — CYCLOBENZAPRINE HCL 5 MG PO TABS: 5.0000 mg | ORAL_TABLET | Freq: Three times a day (TID) | ORAL | 1 refills | Status: DC | PRN

## 2019-06-29 MED FILL — CYCLOBENZAPRINE HCL 5 MG TA: 5 | 20 days supply | Qty: 60 | Fill #0

## 2019-06-29 NOTE — Assessment & Plan Note (Signed)
Chronic problem with exacerbation.  Discussed with patient about posture and ergonomics.  Core strengthening, stability.  Patient uses topical anti-inflammatories for medication management as well as meloxicam as needed.  Increase activity as tolerated.  Follow-up again in 4 to 8 weeks

## 2019-06-29 NOTE — Assessment & Plan Note (Signed)
   Decision today to treat with OMT was based on Physical Exam  After verbal consent patient was treated with HVLA, ME, FPR techniques in , thoracic, rib, lumbar and sacral areas, all areas are chronic   Patient tolerated the procedure well with improvement in symptoms  Patient given exercises, stretches and lifestyle modifications  See medications in patient instructions if given  Patient will follow up in 4-8 weeks

## 2019-06-29 NOTE — Progress Notes (Signed)
[name] [place] Phone: [phone] Subjective:   Martha [name], am serving as a scribe for Dr. Hulan Saas. This visit occurred during the SARS-CoV-2 public health emergency.  Safety protocols were in place, including screening questions prior to the visit, additional usage of staff PPE, and extensive cleaning of exam room while observing appropriate contact time as indicated for disinfecting solutions.   I'm seeing this patient by the request  of:  Patient, No Pcp Per  CC: Low back pain follow-up  [id number]  Martha [name] is a 39 y.o. female coming in with complaint of low back pain. Last seen on 05/28/2019 for OMT. Patient states more on the left side than the right.  Patient describes the pain as a dull, throbbing aching pain.  Patient denies any significant radiation down the leg.  Patient states that it can happen at night from time to time.  Patient feels like it is somewhat her ergonomics at work.       Past Medical History:  Diagnosis Date  . Asthma   . MS (multiple sclerosis) ([place])   . PCOS (polycystic ovarian syndrome)   . Raynaud's disease    Past Surgical History:  Procedure Laterality Date  . TONSILECTOMY, ADENOIDECTOMY, BILATERAL MYRINGOTOMY AND TUBES  1986   Social History   Socioeconomic History  . Marital status: Married    Spouse name: Not on file  . Number of children: 0  . Years of education: Not on file  . Highest education level: Not on file  Occupational History  . Occupation: physicain  Tobacco Use  . Smoking status: Never Smoker  . Smokeless tobacco: Never Used  Substance and Sexual Activity  . Alcohol use: No  . Drug use: No  . Sexual activity: Not on file  Other Topics Concern  . Not on file  Social History Narrative   Right handed   1-2 cups caffeine daily   Lives at home with husband   Social Determinants of Health   Financial Resource Strain:   .  Difficulty of Paying Living Expenses:   Food Insecurity:   . Worried About Charity fundraiser in the Last Year:   . Arboriculturist in the Last Year:   Transportation Needs:   . Film/video editor (Medical):   Marland Kitchen Lack of Transportation (Non-Medical):   Physical Activity:   . Days of Exercise per Week:   . Minutes of Exercise per Session:   Stress:   . Feeling of Stress :   Social Connections:   . Frequency of Communication with Friends and Family:   . Frequency of Social Gatherings with Friends and Family:   . Attends Religious Services:   . Active Member of Clubs or Organizations:   . Attends Archivist Meetings:   Marland Kitchen Marital Status:    No Known Allergies Family History  Problem Relation Age of Onset  . Diabetes Mother   . Heart disease Mother   . Thyroid disease Mother   . Hypertension Mother   . Hypercholesterolemia Mother   . Migraines Mother   . Diabetes Father   . Thyroid disease Father   . Hyperlipidemia Father   . CAD Father   . Hypercholesterolemia Father   . Hypertension Father   . Breast cancer Paternal Aunt     Current Outpatient Medications (Endocrine & Metabolic):  .  estradiol (ESTRACE) 2 MG tablet, Take 2 mg by mouth 2 (  two) times daily. Marland Kitchen  estradiol (VIVELLE-DOT) 0.1 MG/24HR patch, Place 0.1 mg onto the skin every 3 (three) days. .  metFORMIN (GLUCOPHAGE) 500 MG tablet, Take 1 tablet (500 mg total) by mouth daily with breakfast. (Patient not taking: Reported on 06/29/2019) .  methylPREDNISolone (MEDROL DOSEPAK) 4 MG TBPK tablet, follow package directions (Patient not taking: Reported on 06/29/2019)   Current Outpatient Medications (Respiratory):  .  loratadine (CLARITIN) 10 MG tablet, Take 10 mg by mouth daily as needed for allergies.  Current Outpatient Medications (Analgesics):  .  aspirin EC 81 MG tablet, Take 81 mg by mouth daily. .  rizatriptan (MAXALT) 10 MG tablet, Take 1 tablet (10 mg total) by mouth as needed for migraine. May  repeat in 2 hours if needed .  meloxicam (MOBIC) 7.5 MG tablet, Take 2 tablets (15 mg total) by mouth daily as needed for pain. (Patient not taking: Reported on 06/29/2019)   Current Outpatient Medications (Other):  Marland Kitchen  Diclofenac Sodium (PENNSAID) 2 % SOLN, Place 2 g onto the skin 2 (two) times daily. Marland Kitchen  esomeprazole (NEXIUM) 40 MG capsule, TAKE 1 CAPSULE BY MOUTH ONCE DAILY .  zolpidem (AMBIEN) 5 MG tablet, Take 5 mg by mouth as needed for sleep.  .  cyclobenzaprine (FLEXERIL) 5 MG tablet, Take 1 tablet (5 mg total) by mouth 3 (three) times daily as needed for muscle spasms. .  ondansetron (ZOFRAN-ODT) 4 MG disintegrating tablet, Take 1-2 tablets (4-8 mg total) by mouth every 8 (eight) hours as needed for nausea. Also for migraine (Patient not taking: Reported on 06/29/2019) .  Prenatal Vit-Fe Fumarate-FA (PRENATAL MULTIVITAMIN) TABS tablet, Take 1 tablet by mouth daily at 12 noon.   Reviewed prior external information including notes and imaging from  primary care provider As well as notes that were available from care everywhere and other healthcare systems.  Past medical history, social, surgical and family history all reviewed in electronic medical record.  No pertanent information unless stated regarding to the chief complaint.   Review of Systems:  No headache, visual changes, nausea, vomiting, diarrhea, constipation, dizziness, abdominal pain, skin rash, fevers, chills, night sweats, weight loss, swollen lymph nodes, body aches, joint swelling, chest pain, shortness of breath, mood changes. POSITIVE muscle aches  Objective  Blood pressure 130/84, pulse 97, height 5\' 5"  (1.651 m), weight 208 lb (94.3 kg), SpO2 99 %.   General: No apparent distress alert and oriented x3 mood and affect normal, dressed appropriately.  HEENT: Pupils equal, extraocular movements intact  Respiratory: Patient's speak in full sentences and does not appear short of breath  Cardiovascular: No lower extremity  edema, non tender, no erythema  Neuro: Cranial nerves II through XII are intact, neurovascularly intact in all extremities with 2+ DTRs and 2+ pulses.  Gait normal with good balance and coordination.  MSK:  Non tender with full range of motion and good stability and symmetric strength and tone of shoulders, elbows, wrist, hip, knee and ankles bilaterally.  Back exam shows poor core strength.  Patient does have tenderness mostly in the thoracolumbar juncture as well as in the parascapular region right greater than left.  Minimal neck discomfort noted today.  Patient continues to have some mild limited range of motion in all planes.  Osteopathic findings T3 extended rotated and side bent right inhaled third rib T7 extended rotated and side bent left L2 flexed rotated and side bent right Sacrum right on right    Impression and Recommendations:     This  case required medical decision making of moderate complexity. The above documentation has been reviewed and is accurate and complete Martha Pulley, DO       Note: This dictation was prepared with Dragon dictation along with smaller phrase technology. Any transcriptional errors that result from this process are unintentional.

## 2019-06-30 ENCOUNTER — Ambulatory Visit: Payer: 59 | Admitting: Family Medicine

## 2019-07-13 DIAGNOSIS — G35 Multiple sclerosis: Secondary | ICD-10-CM | POA: Diagnosis not present

## 2019-07-13 DIAGNOSIS — G4700 Insomnia, unspecified: Secondary | ICD-10-CM | POA: Diagnosis not present

## 2019-07-13 DIAGNOSIS — G43009 Migraine without aura, not intractable, without status migrainosus: Secondary | ICD-10-CM | POA: Diagnosis not present

## 2019-07-22 MED FILL — FLUOCINONIDE 0.05 % GEL: 0.05 | 7 days supply | Qty: 15 | Fill #0

## 2019-08-01 ENCOUNTER — Telehealth: Payer: Self-pay | Admitting: Neurology

## 2019-08-01 ENCOUNTER — Other Ambulatory Visit: Payer: Self-pay | Admitting: Neurology

## 2019-08-01 MED ORDER — NARATRIPTAN HCL 2.5 MG PO TABS: 2.5000 mg | ORAL_TABLET | ORAL | 11 refills | Status: DC | PRN

## 2019-08-01 NOTE — Telephone Encounter (Signed)
Dr. Felecia Shelling, this is a really lovely patient with MS, she is one of our Hospitalists at [facility]. I've seen patient in 2018 for migraines. She sees a physician at another institution for her [place] [facility] and she would like to transition care locally, her MS doctor has suggested Ocrevus for her. Would you be able to see her? I am happy to assist with getting her records and CDs and referral from pcp if needed. There is an MRI from 12/2018 in Epic.

## 2019-08-03 IMAGING — CT CT ANGIO CHEST
2 of 8 series · 18 of 46 positions shown · IV contrast (iopamidol)
Comparison: None.

CLINICAL DATA: Onset of upper back pain between shoulder blades
which shortness-of-breath on deep inspiration yesterday. Symptoms
have persisted. Evaluate for pulmonary embolism.

EXAM:
CT ANGIOGRAPHY CHEST WITH CONTRAST
TECHNIQUE: Multidetector CT imaging of the chest was performed using the
standard protocol during bolus administration of intravenous
contrast. Multiplanar CT image reconstructions and MIPs were
obtained to evaluate the vascular anatomy.
CONTRAST:  60 mL C7LRNE-5TJ IOPAMIDOL (C7LRNE-5TJ) INJECTION 76%,

[Series 6: thins · axial · 0.69mm/px · z∈[+1263,+1447]mm · 15 of 204 slices shown]
[im 10/204  lung]
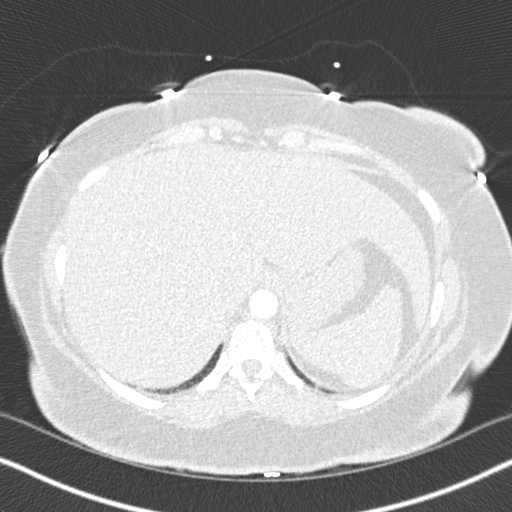
[im 28/204  soft-tissue]
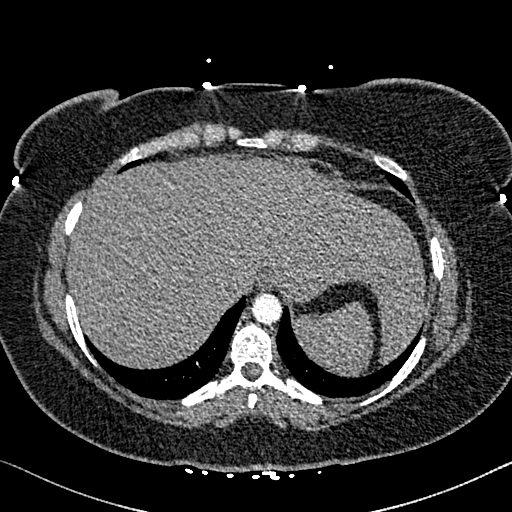
[im 37/204  lung]
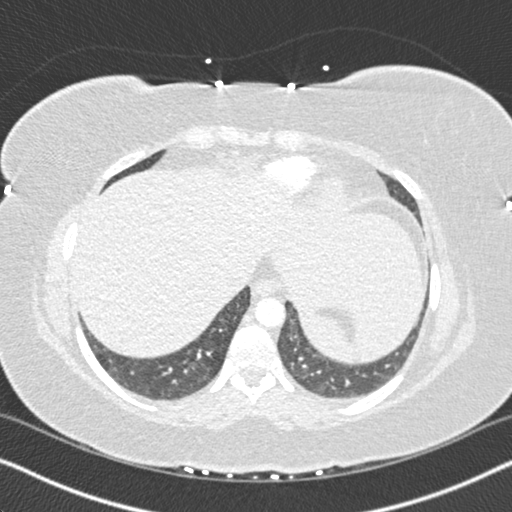
[im 47/204  soft-tissue]
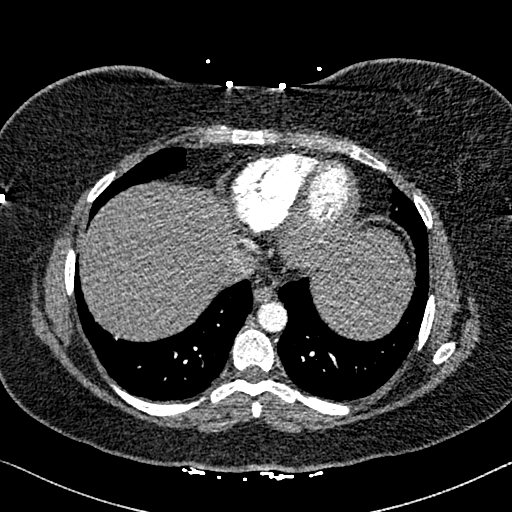
[im 65/204  lung]
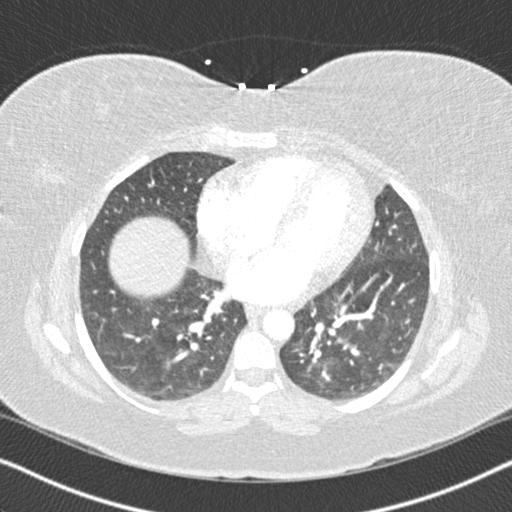
[im 74/204  soft-tissue]
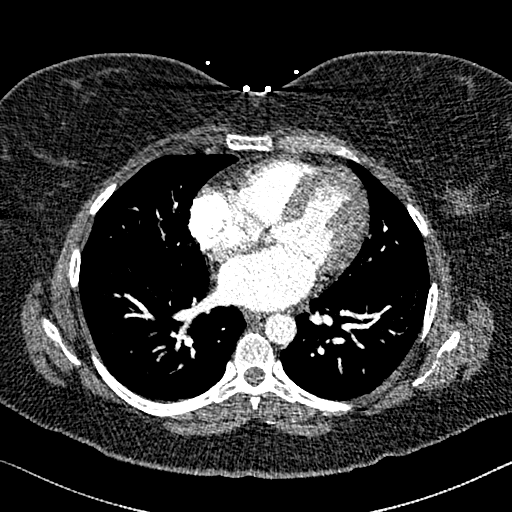
[im 93/204  lung]
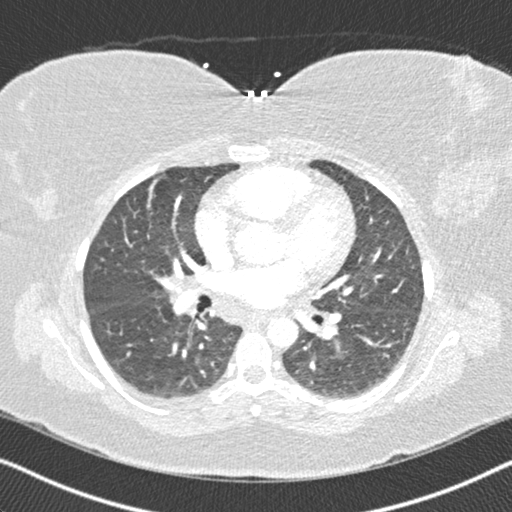
[im 102/204  soft-tissue]
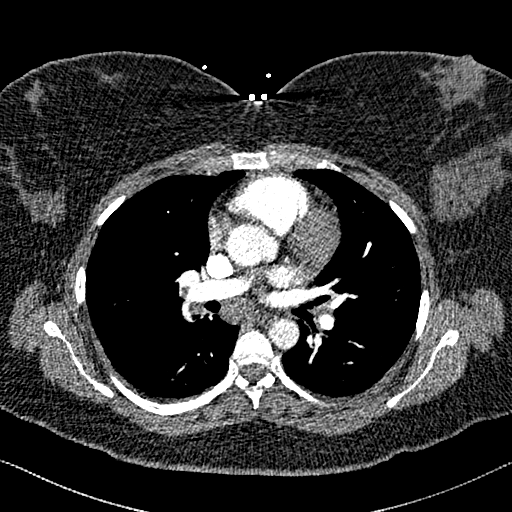
[im 111/204  lung]
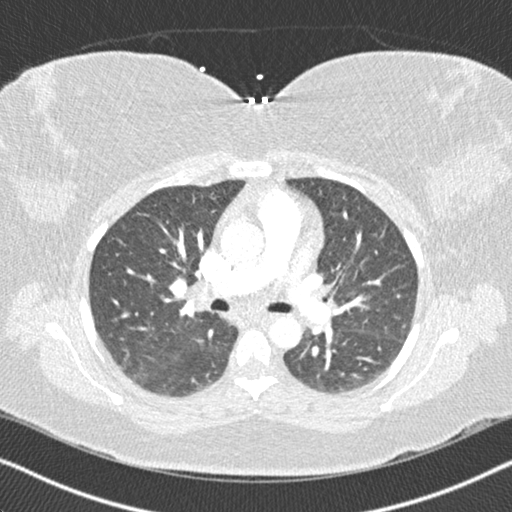
[im 130/204  soft-tissue]
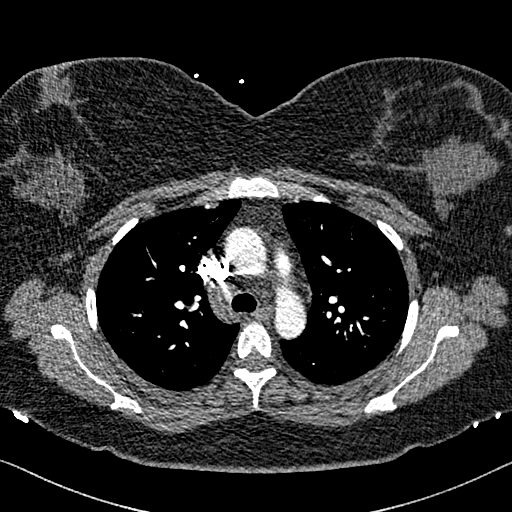
[im 139/204  lung]
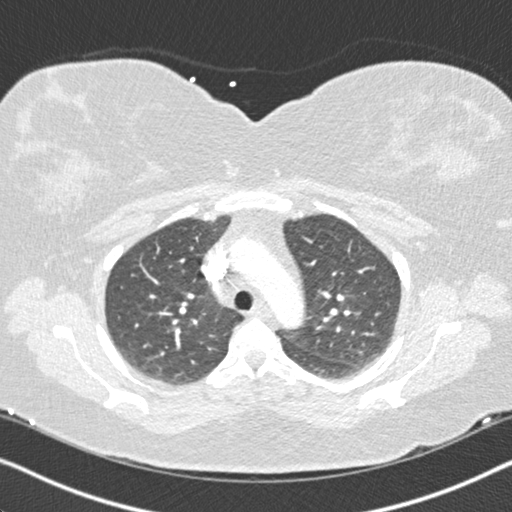
[im 157/204  soft-tissue]
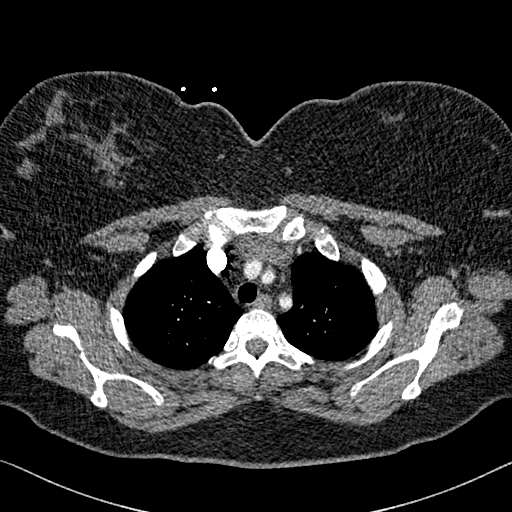
[im 167/204  lung]
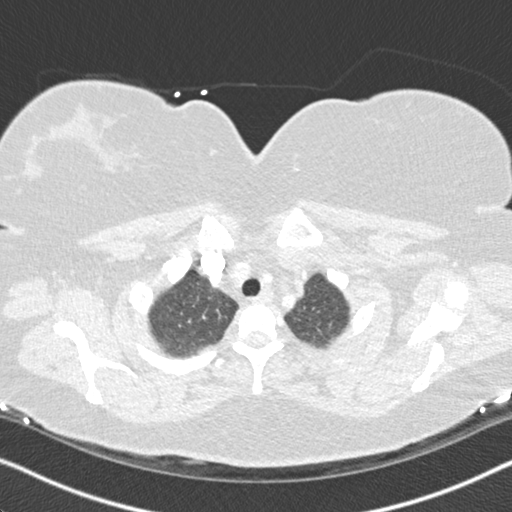
[im 176/204  soft-tissue]
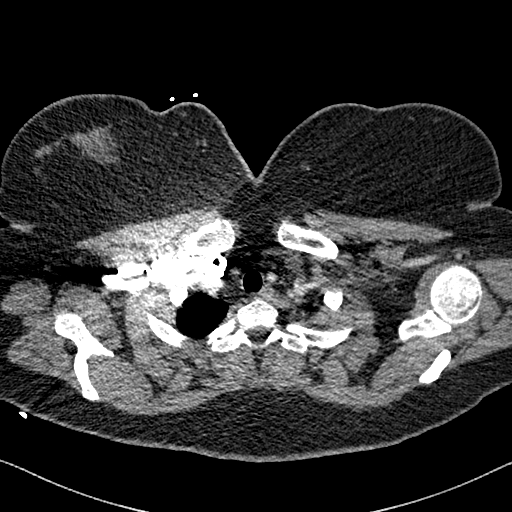
[im 194/204  lung]
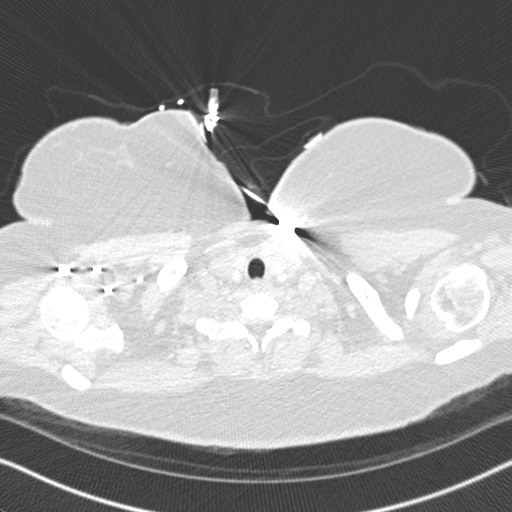

[Series 8: coronal mpr · coronal · 0.41mm/px · 3 of 151 slices shown]
[im 38/151  soft-tissue]
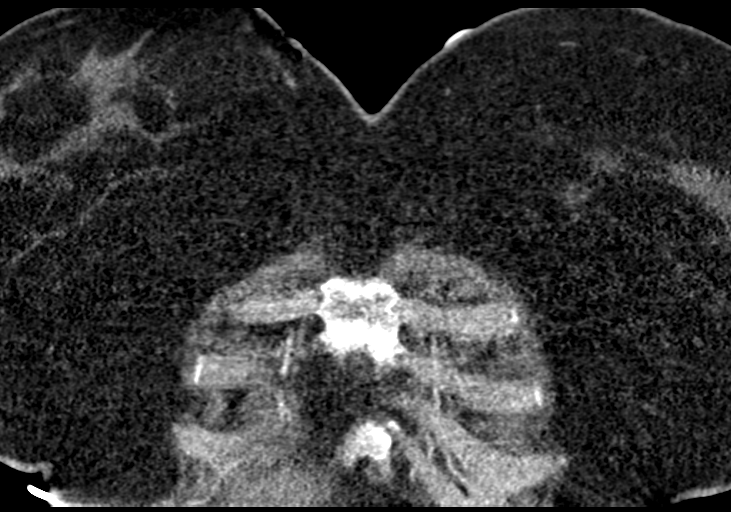
[im 76/151  soft-tissue]
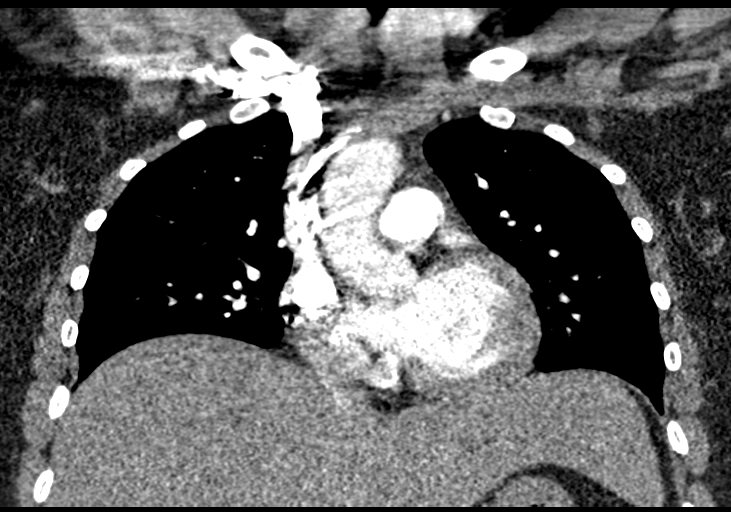
[im 113/151  soft-tissue]
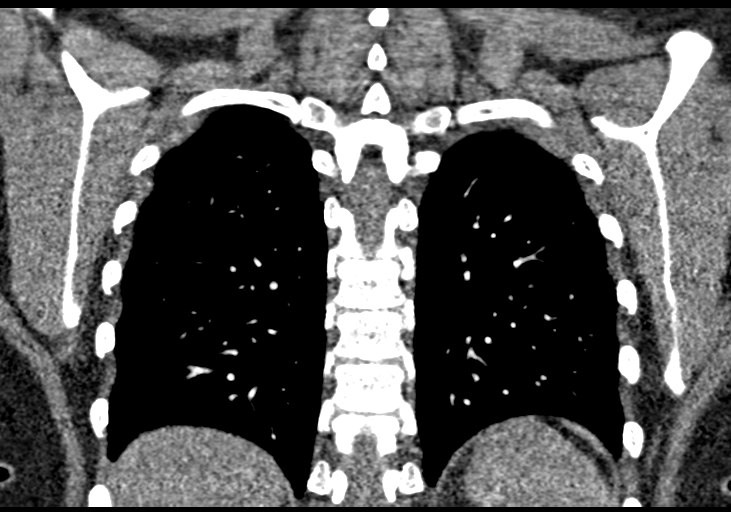

[18 of 46 positions shown; findings below may reference images not displayed]

FINDINGS: Cardiovascular: Heart is normal size. Thoracic aorta is normal.
Pulmonary arterial system is normal without emboli.

Mediastinum/Nodes: 1.2 cm subcarinal lymph node likely reactive. No
hilar adenopathy. Remaining mediastinal structures are normal.

Lungs/Pleura: Lungs are well inflated without focal airspace
consolidation or effusion. Airways are normal.

Upper Abdomen: No acute findings.

Musculoskeletal: Normal.

Review of the MIP images confirms the above findings.
IMPRESSION: No evidence of pulmonary embolism. No acute cardiopulmonary disease.

## 2019-08-04 MED FILL — NARATRIPTAN HCL 2.5 MG TAB: 2.5 | 30 days supply | Qty: 9 | Fill #0

## 2019-08-04 NOTE — Telephone Encounter (Signed)
I'd be happy to see her.  I;ll get her name to the schedulers.

## 2019-08-05 ENCOUNTER — Ambulatory Visit (INDEPENDENT_AMBULATORY_CARE_PROVIDER_SITE_OTHER): Payer: 59 | Admitting: Family Medicine

## 2019-08-05 ENCOUNTER — Encounter: Payer: Self-pay | Admitting: Family Medicine

## 2019-08-05 ENCOUNTER — Other Ambulatory Visit: Payer: Self-pay

## 2019-08-05 VITALS — BP 110/80 | HR 78 | Ht 65.0 in | Wt 204.0 lb

## 2019-08-05 DIAGNOSIS — G8929 Other chronic pain: Secondary | ICD-10-CM

## 2019-08-05 DIAGNOSIS — M999 Biomechanical lesion, unspecified: Secondary | ICD-10-CM | POA: Diagnosis not present

## 2019-08-05 DIAGNOSIS — M546 Pain in thoracic spine: Secondary | ICD-10-CM

## 2019-08-05 NOTE — Assessment & Plan Note (Signed)
   Decision today to treat with OMT was based on Physical Exam  After verbal consent patient was treated with HVLA, ME, FPR techniques in cervical, thoracic, rib,areas, all areas are chronic   Patient tolerated the procedure well with improvement in symptoms  Patient given exercises, stretches and lifestyle modifications  See medications in patient instructions if given  Patient will follow up in 4-8 weeks 

## 2019-08-05 NOTE — Progress Notes (Signed)
[facility] [address] [place] Phone: [phone] Subjective:   I Martha [name] am serving as a Education administrator for Dr. Hulan Saas.  This visit occurred during the SARS-CoV-2 public health emergency.  Safety protocols were in place, including screening questions prior to the visit, additional usage of staff PPE, and extensive cleaning of exam room while observing appropriate contact time as indicated for disinfecting solutions.   I'm seeing this patient by the request  of:  Patient, No Pcp Per  CC: Mid and low back pain follow-up  [id number]  Martha [name] is a 39 y.o. female coming in with complaint of back and neck pain. Last seen on 06/29/2019 for OMT. Patient states  Patient states she is sore.   Medications patient has been prescribed: Meloxicam, Flexeril  Taking: Taking Flexeril at night and sometimes in Ambien         Reviewed prior external information including notes and imaging from previsou exam, outside providers and external EMR if available.   As well as notes that were available from care everywhere and other healthcare systems.  Past medical history, social, surgical and family history all reviewed in electronic medical record.  No pertanent information unless stated regarding to the chief complaint.   Past Medical History:  Diagnosis Date  . Asthma   . MS (multiple sclerosis) ([place])   . PCOS (polycystic ovarian syndrome)   . Raynaud's disease     No Known Allergies no known drug allergies   Review of Systems:  No headache, visual changes, nausea, vomiting, diarrhea, constipation, dizziness, abdominal pain, skin rash, fevers, chills, night sweats, weight loss, swollen lymph nodes, body aches, joint swelling, chest pain, shortness of breath, mood changes. POSITIVE muscle aches  Objective  Blood pressure 110/80, pulse 78, height 5\' 5"  (1.651 m), weight 204 lb (92.5 kg), SpO2 97 %.   General: No apparent  distress alert and oriented x3 mood and affect normal, dressed appropriately.  HEENT: Pupils equal, extraocular movements intact  Respiratory: Patient's speak in full sentences and does not appear short of breath  Cardiovascular: No lower extremity edema, non tender, no erythema  Neuro: Cranial nerves II through XII are intact, neurovascularly intact in all extremities with 2+ DTRs and 2+ pulses.  Gait normal with good balance and coordination.  MSK:  Non tender with full range of motion and good stability and symmetric strength and tone of shoulders, elbows, wrist, hip, knee and ankles bilaterally.  Neck exam does have more tightness in the parascapular region.  Patient seems to be more right greater than left.  Likely fibroid noted at the T3 level.  Out of 5 strength of the upper extremities.  No numbness noted in any part of the axial spine.  Osteopathic findings   T3 extended rotated and side bent right inhaled rib T6 extended rotated and side bent left        Assessment and Plan:    Nonallopathic problems  Decision today to treat with OMT was based on Physical Exam  After verbal consent patient was treated with HVLA, ME, FPR techniques in lumbar, rib, thoracic,  areas  Patient tolerated the procedure well with improvement in symptoms  Patient given exercises, stretches and lifestyle modifications  See medications in patient instructions if given  Patient will follow up in 4-8 weeks      The above documentation has been reviewed and is accurate and complete Lyndal Pulley, DO  Note: This dictation was prepared with Dragon dictation along with smaller phrase technology. Any transcriptional errors that result from this process are unintentional.

## 2019-08-05 NOTE — Patient Instructions (Signed)
Good to see you Here if you need me See me again in 6 weeks

## 2019-08-05 NOTE — Assessment & Plan Note (Signed)
Thoracic back pain radiating pain patient has had difficulty for quite some time.  Patient does have known multiple sclerosis but has done very well with conservative therapy.  Discussed posture ergonomics and movement discussed the possibility of patient's workstation playing a role in some of her discomfort and pain is patient will increase activity slowly.  Follow-up with me again in 4 to 6 weeks

## 2019-08-19 ENCOUNTER — Other Ambulatory Visit (HOSPITAL_COMMUNITY): Payer: Self-pay | Admitting: Gastroenterology

## 2019-08-19 DIAGNOSIS — N959 Unspecified menopausal and perimenopausal disorder: Secondary | ICD-10-CM | POA: Diagnosis not present

## 2019-08-19 DIAGNOSIS — R7301 Impaired fasting glucose: Secondary | ICD-10-CM | POA: Diagnosis not present

## 2019-08-19 DIAGNOSIS — D509 Iron deficiency anemia, unspecified: Secondary | ICD-10-CM | POA: Diagnosis not present

## 2019-08-19 DIAGNOSIS — G35 Multiple sclerosis: Secondary | ICD-10-CM | POA: Diagnosis not present

## 2019-08-19 DIAGNOSIS — E236 Other disorders of pituitary gland: Secondary | ICD-10-CM | POA: Diagnosis not present

## 2019-08-19 DIAGNOSIS — E559 Vitamin D deficiency, unspecified: Secondary | ICD-10-CM | POA: Diagnosis not present

## 2019-08-19 DIAGNOSIS — Z1329 Encounter for screening for other suspected endocrine disorder: Secondary | ICD-10-CM | POA: Diagnosis not present

## 2019-08-19 DIAGNOSIS — N951 Menopausal and female climacteric states: Secondary | ICD-10-CM | POA: Diagnosis not present

## 2019-08-19 DIAGNOSIS — Z0001 Encounter for general adult medical examination with abnormal findings: Secondary | ICD-10-CM | POA: Diagnosis not present

## 2019-08-19 DIAGNOSIS — K909 Intestinal malabsorption, unspecified: Secondary | ICD-10-CM | POA: Diagnosis not present

## 2019-08-19 DIAGNOSIS — Z13228 Encounter for screening for other metabolic disorders: Secondary | ICD-10-CM | POA: Diagnosis not present

## 2019-08-19 DIAGNOSIS — E079 Disorder of thyroid, unspecified: Secondary | ICD-10-CM | POA: Diagnosis not present

## 2019-08-19 DIAGNOSIS — D649 Anemia, unspecified: Secondary | ICD-10-CM | POA: Diagnosis not present

## 2019-08-19 DIAGNOSIS — Z79899 Other long term (current) drug therapy: Secondary | ICD-10-CM | POA: Diagnosis not present

## 2019-08-19 DIAGNOSIS — Z130 Encounter for screening for diseases of the blood and blood-forming organs and certain disorders involving the immune mechanism: Secondary | ICD-10-CM | POA: Diagnosis not present

## 2019-08-19 DIAGNOSIS — E785 Hyperlipidemia, unspecified: Secondary | ICD-10-CM | POA: Diagnosis not present

## 2019-08-19 DIAGNOSIS — E039 Hypothyroidism, unspecified: Secondary | ICD-10-CM | POA: Diagnosis not present

## 2019-08-19 DIAGNOSIS — R5383 Other fatigue: Secondary | ICD-10-CM | POA: Diagnosis not present

## 2019-08-20 ENCOUNTER — Ambulatory Visit: Payer: 59 | Admitting: Neurology

## 2019-08-24 ENCOUNTER — Encounter: Payer: Self-pay | Admitting: Neurology

## 2019-08-24 ENCOUNTER — Ambulatory Visit (INDEPENDENT_AMBULATORY_CARE_PROVIDER_SITE_OTHER): Payer: 59 | Admitting: Neurology

## 2019-08-24 VITALS — BP 119/80 | HR 73 | Ht 65.0 in | Wt 204.0 lb

## 2019-08-24 DIAGNOSIS — L439 Lichen planus, unspecified: Secondary | ICD-10-CM | POA: Insufficient documentation

## 2019-08-24 DIAGNOSIS — G35 Multiple sclerosis: Secondary | ICD-10-CM | POA: Diagnosis not present

## 2019-08-24 DIAGNOSIS — G43001 Migraine without aura, not intractable, with status migrainosus: Secondary | ICD-10-CM | POA: Diagnosis not present

## 2019-08-24 NOTE — Progress Notes (Signed)
[facility]   PATIENT: Martha [name] DOB: [birth year]  REFERRING DOCTOR OR PCP:  [name] [facility]) SOURCE: Patient, notes from neurology, laboratory reports, imaging reports, MRI images personally reviewed.  _________________________________   HISTORICAL  CHIEF COMPLAINT:  Chief Complaint  Patient presents with  . Multiple Sclerosis    Room 13. She is here to transfer care from [facility]. She was diagnosed in 2009. She has been on Avonex, Copaxone and Tecfidera in the past. She stopped Tecfidera in 2017 so she could plan for a family. She has been unable to have a child after multiple failed natural pregnancies and trying with IVF. She is now ready to restart therapy.     HISTORY OF PRESENT ILLNESS:  I had the pleasure seeing your patient, Dr. [name], at the [place] [facility] at [facility] for a neurologic consultation regarding her multiple sclerosis.  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  She initially saw ophthalmology and was referred to neuro-ophthalmology (Dr. [name]).  After an MRI showed changes consistent with MS, she was referred to Dr. [name], at [facility] neurolog[facility].     She was placed on Avonex and tolerated it well.   Due to LFT elevation, she switched to Copaxone.    She had tolerability issues and switched to Tecfidera in 2013.  She had some mild liver elevation.  Her MS was stable on all of these medications.  She stopped Tecfidera in 2017 due to family planning issues.   Most recently, she had an MRI 12/2018 which was compared to an MRI from 2016 and showed progression.  She has not been able to get pregnant and is ready to restart therapy.  Currently, she denies any difficulty with gait, balance or strength.  She has occasional numbness in her limbs.    She notes some vertigo intermittent ly, especially if she turns her head quickly.      Vision returned to baseline.   Bladder function is fine.    She has fatigue frequently.    She occasionaly has had a cognitive concern but has no difficulty at work in a complex job.    She denies significant depression but has mild mood fluctuations  She has low vitamin D and takes supplements weekly.       I personally reviewed the MRI of the brain 12/05/2018.  It showed multiple T2/FLAIR hyperintense foci, predominantly in the periventricular white matter radially oriented to the ventricles.  A couple foci were also noted in the juxtacortical white matter, possibly a couple punctate foci in the pons, deep white matter and one focus in the right cerebellar hemisphere.  None of the foci enhanced.  There were no films for comparison.    She reports a cervical spine MRI in 2013 did not show any plaque.     MRI lumbar in 2015 showed mild facet hypertrophy at a few levels.     She has migraine headaches that are not too much of a problem at this time.  She had Lichen Planus on the left arm and has a sore in her mouth potentially the same diagnosis.  She is going to see an oral surgeon for possible biopsy.   REVIEW OF SYSTEMS: Constitutional: No fevers, chills, sweats, or change in appetite Eyes: No visual changes, double vision, eye pain Ear, nose and throat: No hearing loss, ear pain, nasal congestion, sore throat.  She currently  has a sore in her mouth potentially worrisome for lichen planus Cardiovascular: No chest pain, palpitations Respiratory: No shortness of breath at rest or with exertion.   No wheezes GastrointestinaI: No nausea, vomiting, diarrhea, abdominal pain, fecal incontinence Genitourinary: No dysuria, urinary retention or frequency.  No nocturia. Musculoskeletal: No neck pain.  She has some back pain. Integumentary: No rash, pruritus.  She had lichen planus in the left arm Neurological: as above Psychiatric: No depression at this time.  No anxiety Endocrine: No palpitations,  diaphoresis, change in appetite, change in weigh or increased thirst Hematologic/Lymphatic: No anemia, purpura, petechiae. Allergic/Immunologic: No itchy/runny eyes, nasal congestion, recent allergic reactions, rashes  ALLERGIES: No Known Allergies  HOME MEDICATIONS:  Current Outpatient Medications:  .  aspirin EC 81 MG tablet, Take 81 mg by mouth daily., Disp: , Rfl:  .  cyclobenzaprine (FLEXERIL) 5 MG tablet, Take 1 tablet (5 mg total) by mouth 3 (three) times daily as needed for muscle spasms., Disp: 60 tablet, Rfl: 1 .  Diclofenac Sodium (PENNSAID) 2 % SOLN, Place 2 g onto the skin 2 (two) times daily., Disp: 112 g, Rfl: 3 .  esomeprazole (NEXIUM) 40 MG capsule, TAKE 1 CAPSULE BY MOUTH ONCE DAILY, Disp: 90 capsule, Rfl: 3 .  loratadine (CLARITIN) 10 MG tablet, Take 10 mg by mouth daily as needed for allergies., Disp: , Rfl:  .  naratriptan (AMERGE) 2.5 MG tablet, Take 1 tablet (2.5 mg total) by mouth as needed for migraine. Take one (1) tablet at onset of headache; if returns or does not resolve, may repeat after 2 hours; do not exceed five (5) mg in 24 hours., Disp: 9 tablet, Rfl: 11 .  ondansetron (ZOFRAN-ODT) 4 MG disintegrating tablet, Take 1-2 tablets (4-8 mg total) by mouth every 8 (eight) hours as needed for nausea. Also for migraine, Disp: 30 tablet, Rfl: 3 .  Prenatal Vit-Fe Fumarate-FA (PRENATAL MULTIVITAMIN) TABS tablet, Take 1 tablet by mouth daily at 12 noon., Disp: , Rfl:  .  zolpidem (AMBIEN) 5 MG tablet, Take 5 mg by mouth as needed for sleep. , Disp: , Rfl: 5  PAST MEDICAL HISTORY: Past Medical History:  Diagnosis Date  . Asthma   . Lichen planus   . MS (multiple sclerosis) ([place])   . PCOS (polycystic ovarian syndrome)   . Raynaud's disease     PAST SURGICAL HISTORY: Past Surgical History:  Procedure Laterality Date  . colonoscopy    . DILATION AND CURETTAGE OF UTERUS     x 3  . ESOPHAGOGASTRODUODENOSCOPY    . TONSILLECTOMY AND ADENOIDECTOMY      FAMILY  HISTORY: Family History  Problem Relation Age of Onset  . Diabetes Mother   . Thyroid disease Mother   . Hypertension Mother   . Hypercholesterolemia Mother   . Migraines Mother   . Depression Mother   . Diabetes Father   . Thyroid disease Father   . CAD Father   . Hypercholesterolemia Father   . Hypertension Father   . Depression Father   . Pernicious anemia Father   . Breast cancer Paternal Aunt     SOCIAL HISTORY:  Social History   Socioeconomic History  . Marital status: Married    Spouse name: Not on file  . Number of children: 0  . Years of education: college  . Highest education level: Professional school degree (e.g., MD, DDS, DVM, JD)  Occupational History  . Occupation: physician  Tobacco Use  . Smoking status: Never Smoker  .  Smokeless tobacco: Never Used  Vaping Use  . Vaping Use: Never used  Substance and Sexual Activity  . Alcohol use: No  . Drug use: No  . Sexual activity: Not on file  Other Topics Concern  . Not on file  Social History Narrative   Right handed   1-2 cups caffeine daily   Lives at home with husband   Social Determinants of Health   Financial Resource Strain:   . Difficulty of Paying Living Expenses:   Food Insecurity:   . Worried About Charity fundraiser in the Last Year:   . Arboriculturist in the Last Year:   Transportation Needs:   . Film/video editor (Medical):   Marland Kitchen Lack of Transportation (Non-Medical):   Physical Activity:   . Days of Exercise per Week:   . Minutes of Exercise per Session:   Stress:   . Feeling of Stress :   Social Connections:   . Frequency of Communication with Friends and Family:   . Frequency of Social Gatherings with Friends and Family:   . Attends Religious Services:   . Active Member of Clubs or Organizations:   . Attends Archivist Meetings:   Marland Kitchen Marital Status:   Intimate Partner Violence:   . Fear of Current or Ex-Partner:   . Emotionally Abused:   Marland Kitchen Physically Abused:    . Sexually Abused:      PHYSICAL EXAM  Vitals:   08/24/19 0842  BP: 119/80  Pulse: 73  Weight: 204 lb (92.5 kg)  Height: 5\' 5"  (1.651 m)    Body mass index is 33.95 kg/m.   General: The patient is well-developed and well-nourished and in no acute distress  HEENT:  Head is [place]/AT.  Sclera are anicteric.  Visual acuity is 20/20 OD and 20/20-2 OS  Neck: No carotid bruits are noted.  The neck is nontender.  Cardiovascular: The heart has a regular rate and rhythm with a normal S1 and S2. There were no murmurs, gallops or rubs.    Skin: Extremities are without rash or  edema.  Musculoskeletal:  Back is nontender  Neurologic Exam  Mental status: The patient is alert and oriented x 3 at the time of the examination. The patient has apparent normal recent and remote memory, with an apparently normal attention span and concentration ability.   Speech is normal.  Cranial nerves: Extraocular movements are full.  Color vision was symmetric.  Facial symmetry is present. There is good facial sensation to soft touch bilaterally.Facial strength is normal.  Trapezius and sternocleidomastoid strength is normal. No dysarthria is noted.  The tongue is midline, and the patient has symmetric elevation of the soft palate. No obvious hearing deficits are noted.  Motor:  Muscle bulk is normal.   Tone is normal. Strength is  5 / 5 in all 4 extremities.   Sensory: Sensory testing is intact to pinprick, soft touch and vibration sensation in all 4 extremities.  Coordination: Cerebellar testing reveals good finger-nose-finger and heel-to-shin bilaterally.  Gait and station: Station is normal.   Gait is normal. Tandem gait is normal. Romberg is negative.   Reflexes: Deep tendon reflexes are symmetric and normal bilaterally.   Plantar responses are flexor.    DIAGNOSTIC DATA (LABS, IMAGING, TESTING) - I reviewed patient records, labs, notes, testing and imaging myself where available.  Lab Results   Component Value Date   WBC 6.8 04/14/2018   HGB 12.2 04/14/2018   HCT 39.1  04/14/2018   MCV 76.5 (L) 04/14/2018   PLT 444 (H) 04/14/2018      Component Value Date/Time   NA 139 04/14/2018 0725   K 4.8 04/14/2018 0725   CL 105 04/14/2018 0725   CO2 23 04/14/2018 0725   GLUCOSE 92 04/14/2018 0725   BUN 17 04/14/2018 0725   CREATININE 0.87 04/14/2018 0725   CALCIUM 9.2 04/14/2018 0725   PROT 7.4 10/21/2015 1529   ALBUMIN 4.3 10/21/2015 1529   AST 17 10/21/2015 1529   ALT 20 10/21/2015 1529   ALKPHOS 47 10/21/2015 1529   BILITOT 0.3 10/21/2015 1529   GFRNONAA >60 04/14/2018 0725   GFRAA >60 04/14/2018 0725       ASSESSMENT AND PLAN  Multiple sclerosis (HCC)  Migraine without aura and with status migrainosus, not intractable  Lichen planus  In summary, 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  She had been stable on a couple different disease modifying therapies but did have some tolerability issues.  By report, she had some progression on brain MRI from 2020 compared to 2016.  However, the extent of this progression is not clear looking at the last note.  The MRIs were done at different institutions.  I will try to get a copy of the 2016 MRI to compare to the 2020 MRI.  We discussed different therapeutic options.  She does not appear to have an aggressive MS but I would recommend treatment, especially as she may have had some change in MRI between 2016 and 2020.  We discussed multiple options.  She has a sore in her mouth.  If this is lichen planus, we have to be concerned that it is precancerous.  Therefore, I would be reluctant to use Mavenclad.  She is most concerned about PML as a side effect and would prefer not to consider Tysabri.  We discussed Vumerity (or going back on Tecfidera) , the S1 P receptor modulators or Ocrevus.  She saw her neurology last week and has lab work brewing and will forward those  results to [place] to help [place] make this decision.  She appears most interested in either Vumerity or Ocrevus and I had her sign no service request forms.  Once I get the results of the lab work and have a chance to compare the 2 MRIs I will give her a call to discuss getting started on one of the medications.  She will return to see me for regular visit in 4 months but call sooner if there are new or worsening neurologic symptoms.   Laraine Samet A. Felecia Shelling, MD, [facility] 03/14/2109, 7:35 PM Certified in Neurology, Clinical Neurophysiology, Sleep Medicine and Neuroimaging  [facility] [address] [place], [place] [address] [phone]

## 2019-09-14 ENCOUNTER — Telehealth: Payer: Self-pay | Admitting: Neurology

## 2019-09-14 ENCOUNTER — Telehealth: Payer: Self-pay | Admitting: *Deleted

## 2019-09-14 NOTE — Telephone Encounter (Signed)
R/c cd from [facility] cd on [employer].

## 2019-09-14 NOTE — Telephone Encounter (Signed)
I spoke to Dr. Carolynn Comment about the MRIs.  I was able to compare the 2016 MRI to the 2020 MRI.  In the interim, she does have about 5-6 new T2/FLAIR hyperintense foci in the hemispheres.  No changes in the infratentorial white matter.  I recommend that she resume therapy.  Options were discussed at the last visit and we were deciding between [place] and [place].  She did not have aggressive changes, considering that she was off medication for 3 to 4 years.  However, she is showing some activity and needs to get on a disease modifying therapy.  We will proceed with getting her started on Vumerity.  Of note, she has had mildly elevated liver function tests.  We will need to keep an eye on this and make sure that this does not change significantly well on Vumerity.  She is going to also discuss further with her primary care to see if further evaluation is necessary.  Lymphocyte counts were fine.  She signed the service request form at her last visit and I will send it in.

## 2019-09-14 NOTE — Telephone Encounter (Signed)
Faxed vumerity start form to [place]. Received a receipt of confirmation.  Completed PA for vumerity starter kit via covermymeds, Key: [id number] - PA Case ID: [id number]. Sent to [employer]. Should have a determination in 3-5 business days.  Received immediate approval from [place]. "The request has been approved. The authorization is effective for a maximum of 12 fills from 09/14/2019 to 09/12/2020, as long as the member is enrolled in their current health plan. This has been approved for a max daily dosage of 4.0. A written notification letter will follow with additional details."  I have emailed our [place], with this information.

## 2019-09-16 ENCOUNTER — Other Ambulatory Visit: Payer: Self-pay

## 2019-09-16 ENCOUNTER — Ambulatory Visit (INDEPENDENT_AMBULATORY_CARE_PROVIDER_SITE_OTHER): Payer: 59 | Admitting: Family Medicine

## 2019-09-16 VITALS — BP 116/84 | HR 93 | Ht 65.0 in | Wt 203.0 lb

## 2019-09-16 DIAGNOSIS — M999 Biomechanical lesion, unspecified: Secondary | ICD-10-CM

## 2019-09-16 DIAGNOSIS — G35 Multiple sclerosis: Secondary | ICD-10-CM | POA: Diagnosis not present

## 2019-09-16 DIAGNOSIS — G8929 Other chronic pain: Secondary | ICD-10-CM

## 2019-09-16 DIAGNOSIS — M546 Pain in thoracic spine: Secondary | ICD-10-CM | POA: Diagnosis not present

## 2019-09-16 MED FILL — CMPD DIPH/DEXAMETH ELIX: 16 days supply | Qty: 320 | Fill #0

## 2019-09-16 NOTE — Progress Notes (Signed)
[facility] Phone: [phone] Subjective:    I'm seeing this patient by the request  of:  Patient, No Pcp Per  CC: Neck and back pain follow-up  FMB:[id number]  Martha [name] is a 39 y.o. female coming in with complaint of back and neck pain. OMT 08/05/2019. Patient states overall doing relatively well.  Has had unfortunately some mild increase in discomfort.  Patient does have the underlying multiple sclerosis but has not noticed any type of flare recently. Medications patient has been prescribed:  Normal         Reviewed prior external information including notes and imaging from previsou exam, outside providers and external EMR if available.   As well as notes that were available from care everywhere and other healthcare systems.  Past medical history, social, surgical and family history all reviewed in electronic medical record.  No pertanent information unless stated regarding to the chief complaint.   Past Medical History:  Diagnosis Date  . Asthma   . Lichen planus   . MS (multiple sclerosis) ([place])   . PCOS (polycystic ovarian syndrome)   . Raynaud's disease     No Known Allergies   Review of Systems:  No headache, visual changes, nausea, vomiting, diarrhea, constipation, dizziness, abdominal pain, skin rash, fevers, chills, night sweats, weight loss, swollen lymph nodes, body aches, joint swelling, chest pain, shortness of breath, mood changes. POSITIVE muscle aches  Objective  Blood pressure 116/84, pulse 93, height 5\' 5"  (1.651 m), weight 203 lb (92.1 kg), SpO2 97 %.   General: No apparent distress alert and oriented x3 mood and affect normal, dressed appropriately.  HEENT: Pupils equal, extraocular movements intact  Respiratory: Patient's speak in full sentences and does not appear short of breath  Cardiovascular: No lower extremity edema, non tender, no erythema  Neuro: Cranial nerves II  through XII are intact, neurovascularly intact in all extremities with 2+ DTRs and 2+ pulses.  Gait normal with good balance and coordination.  MSK:  Non tender with full range of motion and good stability and symmetric strength and tone of shoulders, elbows, wrist, hip, knee and ankles bilaterally.  Back -low back does have some mild tightness more in the thoracolumbar juncture.  Some in the parascapular region right greater than left.  Mild tightness with straight leg test.   Osteopathic findings   T5 extended rotated and side bent left L2 flexed rotated and side bent right Sacrum right on right       Assessment and Plan:  Thoracic back pain Continue tightness.  Seems to be of the low back a little more than the thoracic today.  Continues to do relatively well though.  Seems to get better with the manipulation.  Has Flexeril for any type of breakthrough pain.  Follow-up again in 6 to 8 weeks   Nonallopathic problems  Decision today to treat with OMT was based on Physical Exam  After verbal consent patient was treated with HVLA, ME, FPR techniques in thoracic, lumbar, and sacral  areas  Patient tolerated the procedure well with improvement in symptoms  Patient given exercises, stretches and lifestyle modifications  See medications in patient instructions if given  Patient will follow up in 4-8 weeks      The above documentation has been reviewed and is accurate and complete Lyndal Pulley, DO       Note: This dictation was prepared with Dragon dictation along with smaller phrase  technology. Any transcriptional errors that result from this process are unintentional.

## 2019-09-17 ENCOUNTER — Encounter: Payer: Self-pay | Admitting: Family Medicine

## 2019-09-17 MED FILL — ZOLPIDEM TARTRATE 10 MG TAB: 10 | 30 days supply | Qty: 30 | Fill #1

## 2019-09-17 MED FILL — CYCLOBENZAPRINE HCL 5 MG TA: 5 | 20 days supply | Qty: 60 | Fill #1

## 2019-09-17 NOTE — Assessment & Plan Note (Signed)
Continue tightness.  Seems to be of the low back a little more than the thoracic today.  Continues to do relatively well though.  Seems to get better with the manipulation.  Has Flexeril for any type of breakthrough pain.  Follow-up again in 6 to 8 weeks

## 2019-09-21 NOTE — Telephone Encounter (Signed)
Received fax from [place] that [employer] [facility] has confirmed that Vumerity has been shipped. Phone: [phone]. Fax: [phone].

## 2019-09-22 MED FILL — WEGOVY 0.25 MG/0.5ML SOAJ: 0.25 | 28 days supply | Qty: 2 | Fill #0

## 2019-09-28 NOTE — Telephone Encounter (Signed)
I called pt. She is unable to speak right now and will call me back.

## 2019-09-28 NOTE — Telephone Encounter (Signed)
Pt returned my call. She has started vumerity and is taking 462mg  BID with no issues. She will call us with any questions or concerns.

## 2019-09-30 MED FILL — TACROLIMUS 0.1% OINTMENT: 0.1 | 14 days supply | Qty: 100 | Fill #0

## 2019-10-11 ENCOUNTER — Encounter (HOSPITAL_COMMUNITY): Payer: Self-pay | Admitting: Emergency Medicine

## 2019-10-11 ENCOUNTER — Emergency Department (HOSPITAL_COMMUNITY)
Admission: EM | Admit: 2019-10-11 | Discharge: 2019-10-12 | Disposition: A | Discharge status: Home / Self Care | Payer: 59 | Attending: Emergency Medicine | Admitting: Emergency Medicine

## 2019-10-11 DIAGNOSIS — R531 Weakness: Secondary | ICD-10-CM | POA: Insufficient documentation

## 2019-10-11 DIAGNOSIS — R109 Unspecified abdominal pain: Secondary | ICD-10-CM | POA: Diagnosis not present

## 2019-10-11 DIAGNOSIS — R200 Anesthesia of skin: Secondary | ICD-10-CM | POA: Insufficient documentation

## 2019-10-11 DIAGNOSIS — Z7982 Long term (current) use of aspirin: Secondary | ICD-10-CM | POA: Diagnosis not present

## 2019-10-11 DIAGNOSIS — R55 Syncope and collapse: Secondary | ICD-10-CM

## 2019-10-11 DIAGNOSIS — R202 Paresthesia of skin: Secondary | ICD-10-CM | POA: Diagnosis not present

## 2019-10-11 DIAGNOSIS — R0902 Hypoxemia: Secondary | ICD-10-CM | POA: Diagnosis not present

## 2019-10-11 DIAGNOSIS — R42 Dizziness and giddiness: Secondary | ICD-10-CM | POA: Diagnosis not present

## 2019-10-11 DIAGNOSIS — R0602 Shortness of breath: Secondary | ICD-10-CM | POA: Diagnosis not present

## 2019-10-11 DIAGNOSIS — R000 Tachycardia, unspecified: Secondary | ICD-10-CM | POA: Diagnosis not present

## 2019-10-11 DIAGNOSIS — J45909 Unspecified asthma, uncomplicated: Secondary | ICD-10-CM | POA: Insufficient documentation

## 2019-10-11 DIAGNOSIS — R110 Nausea: Secondary | ICD-10-CM | POA: Diagnosis not present

## 2019-10-11 DIAGNOSIS — Z20822 Contact with and (suspected) exposure to covid-19: Secondary | ICD-10-CM | POA: Insufficient documentation

## 2019-10-11 DIAGNOSIS — M25512 Pain in left shoulder: Secondary | ICD-10-CM | POA: Diagnosis not present

## 2019-10-11 DIAGNOSIS — R29810 Facial weakness: Secondary | ICD-10-CM | POA: Diagnosis not present

## 2019-10-11 LAB — URINALYSIS, ROUTINE W REFLEX MICROSCOPIC
Bilirubin Urine: NEGATIVE
Glucose, UA: NEGATIVE mg/dL
Ketones, ur: 20 mg/dL — AB
Leukocytes,Ua: NEGATIVE
Nitrite: NEGATIVE
Protein, ur: NEGATIVE mg/dL
Specific Gravity, Urine: 1.02 (ref 1.005–1.030)
pH: 5 (ref 5.0–8.0)

## 2019-10-11 MED ORDER — ONDANSETRON HCL 4 MG/2ML IJ SOLN
4.0000 mg | Freq: Once | INTRAMUSCULAR | Status: AC
  Administered 2019-10-11: 4 mg via INTRAVENOUS
  Filled 2019-10-11: qty 2

## 2019-10-11 MED ORDER — SODIUM CHLORIDE 0.9 % IV BOLUS
500.0000 mL | Freq: Once | INTRAVENOUS | Status: AC
  Administered 2019-10-11: 500 mL via INTRAVENOUS

## 2019-10-11 MED ORDER — PROMETHAZINE HCL 25 MG/ML IJ SOLN
25.0000 mg | Freq: Once | INTRAMUSCULAR | Status: AC
  Administered 2019-10-11: 25 mg via INTRAVENOUS
  Filled 2019-10-11: qty 1

## 2019-10-11 NOTE — ED Triage Notes (Signed)
Pt transported from home by [facility] for c/o mid abd "squeezing" nausea, lightheadedness, lower extremity numbness/tingling, L arm numbness, intermittent pain to L shoulder/anterior chest. A & O.  EMS unable to obtain IV access.

## 2019-10-12 ENCOUNTER — Emergency Department (HOSPITAL_COMMUNITY): Payer: 59

## 2019-10-12 ENCOUNTER — Other Ambulatory Visit: Payer: Self-pay | Admitting: *Deleted

## 2019-10-12 DIAGNOSIS — G35 Multiple sclerosis: Secondary | ICD-10-CM

## 2019-10-12 DIAGNOSIS — Z7982 Long term (current) use of aspirin: Secondary | ICD-10-CM | POA: Diagnosis not present

## 2019-10-12 DIAGNOSIS — M25512 Pain in left shoulder: Secondary | ICD-10-CM | POA: Diagnosis not present

## 2019-10-12 DIAGNOSIS — R110 Nausea: Secondary | ICD-10-CM | POA: Diagnosis not present

## 2019-10-12 DIAGNOSIS — R200 Anesthesia of skin: Secondary | ICD-10-CM | POA: Diagnosis not present

## 2019-10-12 DIAGNOSIS — Z20822 Contact with and (suspected) exposure to covid-19: Secondary | ICD-10-CM | POA: Diagnosis not present

## 2019-10-12 DIAGNOSIS — J45909 Unspecified asthma, uncomplicated: Secondary | ICD-10-CM | POA: Diagnosis not present

## 2019-10-12 DIAGNOSIS — R109 Unspecified abdominal pain: Secondary | ICD-10-CM | POA: Diagnosis not present

## 2019-10-12 DIAGNOSIS — R42 Dizziness and giddiness: Secondary | ICD-10-CM | POA: Diagnosis not present

## 2019-10-12 DIAGNOSIS — R531 Weakness: Secondary | ICD-10-CM | POA: Diagnosis not present

## 2019-10-12 LAB — CBC WITH DIFFERENTIAL/PLATELET
Abs Immature Granulocytes: 0.06 10*3/uL (ref 0.00–0.07)
Basophils Absolute: 0.1 10*3/uL (ref 0.0–0.1)
Basophils Relative: 1 %
Eosinophils Absolute: 0 10*3/uL (ref 0.0–0.5)
Eosinophils Relative: 0 %
HCT: 38.5 % (ref 36.0–46.0)
Hemoglobin: 11.9 g/dL — ABNORMAL LOW (ref 12.0–15.0)
Immature Granulocytes: 1 %
Lymphocytes Relative: 14 %
Lymphs Abs: 1.5 10*3/uL (ref 0.7–4.0)
MCH: 22.8 pg — ABNORMAL LOW (ref 26.0–34.0)
MCHC: 30.9 g/dL (ref 30.0–36.0)
MCV: 73.6 fL — ABNORMAL LOW (ref 80.0–100.0)
Monocytes Absolute: 1 10*3/uL (ref 0.1–1.0)
Monocytes Relative: 10 %
Neutro Abs: 7.6 10*3/uL (ref 1.7–7.7)
Neutrophils Relative %: 74 %
Platelets: 294 10*3/uL (ref 150–400)
RBC: 5.23 MIL/uL — ABNORMAL HIGH (ref 3.87–5.11)
RDW: 15 % (ref 11.5–15.5)
WBC: 10.3 10*3/uL (ref 4.0–10.5)
nRBC: 0 % (ref 0.0–0.2)

## 2019-10-12 LAB — COMPREHENSIVE METABOLIC PANEL
ALT: 30 U/L (ref 0–44)
AST: 21 U/L (ref 15–41)
Albumin: 3.6 g/dL (ref 3.5–5.0)
Alkaline Phosphatase: 41 U/L (ref 38–126)
Anion gap: 11 (ref 5–15)
BUN: 10 mg/dL (ref 6–20)
CO2: 21 mmol/L — ABNORMAL LOW (ref 22–32)
Calcium: 8.6 mg/dL — ABNORMAL LOW (ref 8.9–10.3)
Chloride: 106 mmol/L (ref 98–111)
Creatinine, Ser: 0.84 mg/dL (ref 0.44–1.00)
GFR calc Af Amer: >60 mL/min (ref >60)
GFR calc non Af Amer: >60 mL/min (ref >60)
Glucose, Bld: 125 mg/dL — ABNORMAL HIGH (ref 70–99)
Potassium: 3.6 mmol/L (ref 3.5–5.1)
Sodium: 138 mmol/L (ref 135–145)
Total Bilirubin: 0.4 mg/dL (ref 0.3–1.2)
Total Protein: 6.2 g/dL — ABNORMAL LOW (ref 6.5–8.1)

## 2019-10-12 LAB — URINE CULTURE: Culture: <10000 — AB

## 2019-10-12 LAB — I-STAT BETA HCG BLOOD, ED (MC, WL, AP ONLY): I-stat hCG, quantitative: <5 m[IU]/mL (ref <5)

## 2019-10-12 LAB — TROPONIN I (HIGH SENSITIVITY): Troponin I (High Sensitivity): 4 ng/L (ref <18)

## 2019-10-12 LAB — LIPASE, BLOOD: Lipase: 31 U/L (ref 11–51)

## 2019-10-12 LAB — SARS CORONAVIRUS 2 BY RT PCR (HOSPITAL ORDER, PERFORMED IN ~~LOC~~ HOSPITAL LAB): SARS Coronavirus 2: NEGATIVE

## 2019-10-12 MED ORDER — IOHEXOL 350 MG/ML SOLN
100.0000 mL | Freq: Once | INTRAVENOUS | Status: AC | PRN
  Administered 2019-10-12: 100 mL via INTRAVENOUS

## 2019-10-12 MED ORDER — VUMERITY 231 MG PO CPDR: 231.0000 mg | DELAYED_RELEASE_CAPSULE | Freq: Two times a day (BID) | ORAL | 11 refills | Status: DC

## 2019-10-12 MED ORDER — PROMETHAZINE HCL 25 MG PO TABS
25.0000 mg | ORAL_TABLET | Freq: Four times a day (QID) | ORAL | 0 refills | Status: DC | PRN
Start: 2019-10-12 — End: 2020-11-10

## 2019-10-12 MED FILL — PROMETHAZINE 25 MG TABLET: 25 | 5 days supply | Qty: 20 | Fill #0

## 2019-10-12 NOTE — ED Notes (Signed)
Patient verbalizes understanding of discharge instructions. Opportunity for questioning and answers were provided. Armband removed by staff, pt discharged from ED stable & ambulatory  

## 2019-10-12 NOTE — ED Notes (Signed)
Pt transported to [facility]A via stretcher 

## 2019-10-12 NOTE — ED Provider Notes (Signed)
I assumed care of patient from previous team at shift change, please see their note for full H&P.  Briefly patient is here for evaluation of squeezing chest pain with paresthesias and near syncope.  Physical Exam  BP 116/69 (BP Location: Right Arm)   Pulse (!) 105   Temp 98.5 F (36.9 C) (Tympanic)   Resp 16   LMP 10/05/2019   SpO2 100%   Physical Exam  Patient is awake and alert.  She is sitting up in bed, answers questions appropriately in no visible distress.  Mild epigastric abdominal tenderness without rebound or guarding.  5/5 grip strength bilaterally.  Paresthesias right forearm and hand.    ED Course/Procedures     Procedures CT Angio Chest/Abd/Pel for Dissection W and/or Wo Contrast  Result Date: 10/12/2019 CLINICAL DATA:  Abdominal pain with concern for aortic dissection. Pain to left shoulder. EXAM: CT ANGIOGRAPHY CHEST, ABDOMEN AND PELVIS TECHNIQUE: Non-contrast CT of the chest was initially obtained. Multidetector CT imaging through the chest, abdomen and pelvis was performed using the standard protocol during bolus administration of intravenous contrast. Multiplanar reconstructed images and MIPs were obtained and reviewed to evaluate the vascular anatomy. CONTRAST:  144mL OMNIPAQUE IOHEXOL 350 MG/ML SOLN COMPARISON:  None. FINDINGS: CTA CHEST FINDINGS Cardiovascular: --Heart: The heart size is normal.  There is nopericardial effusion. --Aorta: The course and caliber of the thoracic aorta are normal. There is no aortic atherosclerotic calcification. Precontrast images show no aortic intramural hematoma. There is no blood pool, dissection or penetrating ulcer demonstrated on arterial phase postcontrast imaging. There is a conventional 3 vessel aortic arch branching pattern. The proximal arch vessels are widely patent. --Pulmonary Arteries: Contrast timing is optimized for preferential opacification of the aorta. Within that limitation, normal central pulmonary arteries.  Mediastinum/Nodes: No mediastinal, hilar or axillary lymphadenopathy. The visualized thyroid and thoracic esophageal course are unremarkable. Lungs/Pleura: No pulmonary nodules or masses. No pleural effusion or pneumothorax. No focal airspace consolidation. No focal pleural abnormality. Musculoskeletal: No chest wall abnormality. No acute osseous findings. Review of the MIP images confirms the above findings. CTA ABDOMEN AND PELVIS FINDINGS VASCULAR Aorta: Normal caliber aorta without aneurysm, dissection, vasculitis or hemodynamically significant stenosis. There is no aortic atherosclerosis. Celiac: No aneurysm, dissection or hemodynamically significant stenosis. Normal branching pattern. SMA: Widely patent without dissection or stenosis. Renals: Single renal arteries bilaterally. No aneurysm, dissection, stenosis or evidence of fibromuscular dysplasia. IMA: Patent without abnormality. Inflow: No aneurysm, stenosis or dissection. Veins: Normal course and caliber of the major veins. Assessment is otherwise limited by the arterial dominant contrast phase. Review of the MIP images confirms the above findings. NON-VASCULAR Hepatobiliary: Normal hepatic contours and density. No visible biliary dilatation. Normal gallbladder. Pancreas: Normal contours without ductal dilatation. No peripancreatic fluid collection. Spleen: Normal arterial phase splenic enhancement pattern. Adrenals/Urinary Tract: --Adrenal glands: Normal. --Right kidney/ureter: No hydronephrosis or perinephric stranding. No nephrolithiasis. No obstructing ureteral stones. --Left kidney/ureter: No hydronephrosis or perinephric stranding. No nephrolithiasis. No obstructing ureteral stones. --Urinary bladder: Unremarkable. Stomach/Bowel: --Stomach/Duodenum: No hiatal hernia or other gastric abnormality. Normal duodenal course and caliber. --Small bowel: No dilatation or inflammation. --Colon: No focal abnormality. --Appendix: Not visualized. No right lower  quadrant inflammation or free fluid. Lymphatic:  No abdominal or pelvic lymphadenopathy. Reproductive: Normal uterus and ovaries. Musculoskeletal. No bony spinal canal stenosis or focal osseous abnormality. Other: None. Review of the MIP images confirms the above findings. IMPRESSION: 1. No acute aortic syndrome. 2. No acute abnormality of the chest, abdomen or pelvis. Electronically  Signed   By: Ulyses Jarred M.D.   On: 10/12/2019 01:43    Labs Reviewed  CBC WITH DIFFERENTIAL/PLATELET - Abnormal; Notable for the following components:      Result Value   RBC 5.23 (*)    Hemoglobin 11.9 (*)    MCV 73.6 (*)    MCH 22.8 (*)    All other components within normal limits  COMPREHENSIVE METABOLIC PANEL - Abnormal; Notable for the following components:   CO2 21 (*)    Glucose, Bld 125 (*)    Calcium 8.6 (*)    Total Protein 6.2 (*)    All other components within normal limits  URINALYSIS, ROUTINE W REFLEX MICROSCOPIC - Abnormal; Notable for the following components:   APPearance CLOUDY (*)    Hgb urine dipstick MODERATE (*)    Ketones, ur 20 (*)    Bacteria, UA MANY (*)    All other components within normal limits  SARS CORONAVIRUS 2 BY RT PCR (HOSPITAL ORDER, [place] [facility])  URINE CULTURE  LIPASE, BLOOD  I-STAT BETA HCG BLOOD, ED ([facility], [facility], [facility] ONLY)  TROPONIN I (HIGH SENSITIVITY)  TROPONIN I (HIGH SENSITIVITY)    MDM  Plan is to follow-up on labs, imaging. Pregnancy test is negative.  Troponin x1 is not elevated.  I spoke with patient about repeat troponin, shared decision, we will not obtain repeat troponin.   CMP without significant acute abnormalities.  CBC shows minimal anemia which patient reports is consistent with her baseline.  Lipase is not elevated. UA does show moderate hemoglobin on dipstick, patient is currently on her menstrual cycle. Microscopy shows 0-5 reds, 11-20 whites with many bacteria and only 6-10 squamous epithelial cells.  We will send urine  for culture.  Patient is not currently having any urinary symptoms, so no indication for treatment at this time unless she develops symptoms in the next few days.  She reports that the Phenergan helped her symptoms significantly and she feels better.  We will send prescription for Phenergan to her pharmacy.  Recommended that she follow-up with her specialists.  She has recently restarted on medication for her MS and, while it would be atypical for a flare to be so sudden and rapid and then dissipate did recommend touching base with her neurologist.  Return precautions were discussed with patient who states their understanding.  At the time of discharge patient denied any unaddressed complaints or concerns.  Patient is agreeable for discharge home.  Note: Portions of this report may have been transcribed using voice recognition software. Every effort was made to ensure accuracy; however, inadvertent computerized transcription errors may be present       Lorin Glass, PA-C 10/12/19 0429    Ripley Fraise, MD 10/12/19 [phone]

## 2019-10-12 NOTE — ED Notes (Signed)
Pt returned from CTA at this time

## 2019-10-12 NOTE — Discharge Instructions (Addendum)
I have sent of prescription for Phenergan to your pharmacy.   I am glad that you are feeling better. Please follow up with your specialists.

## 2019-10-12 NOTE — ED Provider Notes (Signed)
[facility] EMERGENCY DEPARTMENT Provider Note   CSN: [id number] Arrival date & time: 10/11/19  2203     History Chief Complaint  Patient presents with  . Weakness    Martha [name] is a 39 y.o. female presenting for evaluation of weakness and nausea.  Patient states she was watching TV between 8 and 830 when she had acute onset left arm squeezing sensation and numbness. From there, she developed a numbness and squeezing sensation of her mid abdomen that goes down by both legs. She is also reporting tingling of both feet. She checked her heart rate and blood pressure at home, states her heart rate was 140 and her blood pressure was 366-294 systolic. This is atypical for her. She reports associated nausea, no vomiting. associated 2 loose stools, no blood. She reports feeling intermittently lightheaded like she is going to pass out, but denies headache, dizziness, vision changes, slurred speech. She denies fall, trauma, or injury. She denies neck pain, cp, cough, sob, abd pain, urinary sxs.  She has a h/o MS is on [employer] as of 2 wks ago. Additional h/o raynauds, PCOS.  Pt is very concerned about possible MI vs dissection.   HPI     Past Medical History:  Diagnosis Date  . Asthma   . Lichen planus   . MS (multiple sclerosis) ([place])   . PCOS (polycystic ovarian syndrome)   . Raynaud's disease     Patient Active Problem List   Diagnosis Date Noted  . Lichen planus 76/54/6503  . Multiple sclerosis ([place]) 08/24/2019  . Degenerative arthritis of right knee 12/02/2018  . Patellar tendinitis of left knee 08/06/2018  . Medial meniscus tear 04/16/2018  . Migraine without aura and with status migrainosus, not intractable 01/10/2017  . GERD (gastroesophageal reflux disease) 02/25/2015  . Thoracic back pain 10/21/2014  . Nonallopathic lesion of thoracic region 10/21/2014  . Nonallopathic lesion of lumbosacral region 10/21/2014  . Nonallopathic lesion-rib cage 10/21/2014     Past Surgical History:  Procedure Laterality Date  . colonoscopy    . DILATION AND CURETTAGE OF UTERUS     x 3  . ESOPHAGOGASTRODUODENOSCOPY    . TONSILLECTOMY AND ADENOIDECTOMY       OB History   No obstetric history on file.     Family History  Problem Relation Age of Onset  . Diabetes Mother   . Thyroid disease Mother   . Hypertension Mother   . Hypercholesterolemia Mother   . Migraines Mother   . Depression Mother   . Diabetes Father   . Thyroid disease Father   . CAD Father   . Hypercholesterolemia Father   . Hypertension Father   . Depression Father   . Pernicious anemia Father   . Breast cancer Paternal Aunt     Social History   Tobacco Use  . Smoking status: Never Smoker  . Smokeless tobacco: Never Used  Vaping Use  . Vaping Use: Never used  Substance Use Topics  . Alcohol use: No  . Drug use: No    Home Medications Prior to Admission medications   Medication Sig Start Date End Date Taking? Authorizing Provider  aspirin EC 81 MG tablet Take 81 mg by mouth daily.   Yes [provider]  Diroximel Fumarate (VUMERITY) 231 MG CPDR Take 231 mg by mouth in the morning and at bedtime.    Yes [provider]  zolpidem (AMBIEN) 5 MG tablet Take 2.5-5 mg by mouth as needed for sleep.  06/14/16  Yes [provider]  cyclobenzaprine (FLEXERIL) 5 MG tablet Take 1 tablet (5 mg total) by mouth 3 (three) times daily as needed for muscle spasms. Patient not taking: Reported on 10/11/2019 06/29/19   Lyndal Pulley, DO  Diclofenac Sodium (PENNSAID) 2 % SOLN Place 2 g onto the skin 2 (two) times daily. Patient not taking: Reported on 10/11/2019 09/03/18   Lyndal Pulley, DO  esomeprazole ([place]) 40 MG capsule TAKE 1 CAPSULE BY MOUTH ONCE DAILY Patient not taking: Reported on 10/11/2019 02/25/17   Lyndal Pulley, DO  naratriptan (AMERGE) 2.5 MG tablet Take 1 tablet (2.5 mg total) by mouth as needed for migraine. Take one (1) tablet at onset of  headache; if returns or does not resolve, may repeat after 2 hours; do not exceed five (5) mg in 24 hours. Patient not taking: Reported on 10/11/2019 08/01/19   Melvenia Beam, MD  ondansetron (ZOFRAN-ODT) 4 MG disintegrating tablet Take 1-2 tablets (4-8 mg total) by mouth every 8 (eight) hours as needed for nausea. Also for migraine Patient not taking: Reported on 10/11/2019 06/10/19   Melvenia Beam, MD    Allergies    Patient has no known allergies.  Review of Systems   Review of Systems  Gastrointestinal: Positive for nausea.  Neurological: Positive for light-headedness and numbness.  All other systems reviewed and are negative.   Physical Exam Updated Vital Signs BP 140/78 (BP Location: Right Arm)   Pulse (!) 115   Temp 98.5 F (36.9 C) (Tympanic)   Resp (!) 22   LMP 10/05/2019   SpO2 100%   Physical Exam Vitals and nursing note reviewed.  Constitutional:      General: She is not in acute distress.    Appearance: She is well-developed.     Comments: Appears anxious, otherwise nontoxic  HENT:     Head: Normocephalic and atraumatic.  Eyes:     Extraocular Movements: Extraocular movements intact.     Conjunctiva/sclera: Conjunctivae normal.     Pupils: Pupils are equal, round, and reactive to light.  Cardiovascular:     Rate and Rhythm: Regular rhythm. Tachycardia present.     Pulses: Normal pulses.     Comments: initially tachycardic around 100, resolved without intervention  Pulmonary:     Effort: Pulmonary effort is normal. No respiratory distress.     Breath sounds: Normal breath sounds. No wheezing.  Abdominal:     General: There is no distension.     Palpations: Abdomen is soft. There is no mass.     Tenderness: There is no abdominal tenderness. There is no guarding or rebound.     Comments: No ttp. No masses. No rigidity, guarding, distention.   Musculoskeletal:        General: Normal range of motion.     Cervical back: Normal range of motion and neck supple.      Right lower leg: No edema.     Left lower leg: No edema.  Skin:    General: Skin is warm and dry.     Capillary Refill: Capillary refill takes less than 2 seconds.  Neurological:     Mental Status: She is alert and oriented to person, place, and time.     GCS: GCS eye subscore is 4. GCS verbal subscore is 5. GCS motor subscore is 6.     Sensory: Sensation is intact.     Motor: Motor function is intact.     Comments: Strength and sensation  intact x4. Good radial and pedal pulses.      ED Results / Procedures / Treatments   Labs (all labs ordered are listed, but only abnormal results are displayed) Labs Reviewed  URINALYSIS, ROUTINE W REFLEX MICROSCOPIC - Abnormal; Notable for the following components:      Result Value   APPearance CLOUDY (*)    Hgb urine dipstick MODERATE (*)    Ketones, ur 20 (*)    Bacteria, UA MANY (*)    All other components within normal limits  SARS CORONAVIRUS 2 BY RT PCR (HOSPITAL ORDER, [place] [facility])  CBC WITH DIFFERENTIAL/PLATELET  COMPREHENSIVE METABOLIC PANEL  LIPASE, BLOOD  I-STAT BETA HCG BLOOD, ED ([facility], [facility], [facility] ONLY)  TROPONIN I (HIGH SENSITIVITY)  TROPONIN I (HIGH SENSITIVITY)    EKG EKG Interpretation  Date/Time:  Sunday October 11 2019 22:12:53 EDT Ventricular Rate:  112 PR Interval:    QRS Duration: 89 QT Interval:  322 QTC Calculation: 440 R Axis:   23 Text Interpretation: Sinus tachycardia Confirmed by Virgel Manifold [id number]) on 10/11/2019 10:57:07 PM   Radiology No results found.  Procedures Procedures (including critical care time)  Medications Ordered in ED Medications  ondansetron (ZOFRAN) injection 4 mg (4 mg Intravenous Given 10/11/19 2315)  sodium chloride 0.9 % bolus 500 mL (500 mLs Intravenous New Bag/Given 10/11/19 2315)  promethazine (PHENERGAN) injection 25 mg (25 mg Intravenous Given 10/11/19 2355)    ED Course  I have reviewed the triage vital signs and the nursing notes.  Pertinent  labs & imaging results that were available during my care of the patient were reviewed by me and considered in my medical decision making (see chart for details).    MDM Rules/Calculators/A&P                          Pt presenting for evaluation of numbness, nausea, and lightheadedness. On exam, pt appears anxious, but nontoxic. No objective neurologic deficits. No ttp of the abd. Pulses symmetrical. Considering lack of risk factors, history, and physical, low suspicion for acs, dissection, pe. However due to pt's concerns, labs and imaging ordered. zofran for nausea. Case discussed with attending, Dr. Wilson Singer evaluated the pt.   Pt signed out to Conard Novak, PA-C for f/u on labs and imaging. If normal, consider reassurance and d/c.    Final Clinical Impression(s) / ED Diagnoses Final diagnoses:  None    Rx / DC Orders ED Discharge Orders    None       Franchot Heidelberg, PA-C 10/12/19 0014    Virgel Manifold, MD 10/12/19 1724

## 2019-10-12 NOTE — ED Notes (Signed)
Pt supplied urine specimen cup, will attempt to leave specimen prior to leaving.

## 2019-10-14 ENCOUNTER — Ambulatory Visit: Payer: 59 | Admitting: Cardiovascular Disease

## 2019-10-14 ENCOUNTER — Encounter: Payer: Self-pay | Admitting: Cardiovascular Disease

## 2019-10-14 ENCOUNTER — Other Ambulatory Visit: Payer: Self-pay

## 2019-10-14 VITALS — BP 112/72 | HR 87 | Ht 63.0 in | Wt 196.0 lb

## 2019-10-14 DIAGNOSIS — R000 Tachycardia, unspecified: Secondary | ICD-10-CM

## 2019-10-14 DIAGNOSIS — R011 Cardiac murmur, unspecified: Secondary | ICD-10-CM

## 2019-10-14 DIAGNOSIS — I7300 Raynaud's syndrome without gangrene: Secondary | ICD-10-CM

## 2019-10-14 DIAGNOSIS — G35 Multiple sclerosis: Secondary | ICD-10-CM | POA: Diagnosis not present

## 2019-10-14 DIAGNOSIS — D509 Iron deficiency anemia, unspecified: Secondary | ICD-10-CM

## 2019-10-14 DIAGNOSIS — R072 Precordial pain: Secondary | ICD-10-CM

## 2019-10-14 MED ORDER — METOPROLOL TARTRATE 100 MG PO TABS
ORAL_TABLET | ORAL | 0 refills | Status: DC
Start: 2019-10-14 — End: 2020-01-07

## 2019-10-14 MED FILL — METOPROLOL TARTRATE 100 MG: 100 | 1 days supply | Qty: 1 | Fill #0

## 2019-10-14 NOTE — Patient Instructions (Signed)
Medication Instructions:  No changes *If you need a refill on your cardiac medications before your next appointment, please call your pharmacy*   Lab Work: None ordered If you have labs (blood work) drawn today and your tests are completely normal, you will receive your results only by: Marland Kitchen MyChart Message (if you have MyChart) OR . A paper copy in the mail If you have any lab test that is abnormal or we need to change your treatment, we will call you to review the results.   Testing/Procedures: Your physician has requested that you have an echocardiogram. Echocardiography is a painless test that uses sound waves to create images of your heart. It provides your doctor with information about the size and shape of your heart and how well your heart's chambers and valves are working. You may receive an ultrasound enhancing agent through an IV if needed to better visualize your heart during the echo.This procedure takes approximately one hour. There are no restrictions for this procedure. This will take place at the [address].   Your physician has requested that you have cardiac CT. Cardiac computed tomography (CT) is a painless test that uses an x-ray machine to take clear, detailed pictures of your heart.. Please follow instruction sheet as given.   Follow-Up: At [facility], you and your health needs are our priority.  As part of our continuing mission to provide you with exceptional heart care, we have created designated Provider Care Teams.  These Care Teams include your primary Cardiologist (physician) and Advanced Practice Providers (APPs -  Physician Assistants and Nurse Practitioners) who all work together to provide you with the care you need, when you need it.  We recommend signing up for the patient portal called "MyChart".  Sign up information is provided on this After Visit Summary.  MyChart is used to connect with patients for Virtual Visits (Telemedicine).   Patients are able to view lab/test results, encounter notes, upcoming appointments, etc.  Non-urgent messages can be sent to your provider as well.   To learn more about what you can do with MyChart, go to [email or web address].    Your next appointment:   Follow up as needed with Dr. Sallyanne Kuster   Other Instructions Your cardiac CT will be scheduled at one of the below locations:   [facility] [address] [place], [place] [address] [phone]  [place] [address] [place], [place] [address] [phone]  If scheduled at [facility], please arrive at the [facility] main entrance of [facility] 30 minutes prior to test start time. Proceed to the [facility] Radiology Department (first floor) to check-in and test prep.  If scheduled at [facility], please arrive 15 mins early for check-in and test prep.  Please follow these instructions carefully (unless otherwise directed):  On the Night Before the Test: . Be sure to Drink plenty of water. . Do not consume any caffeinated/decaffeinated beverages or chocolate 12 hours prior to your test. . Do not take any antihistamines 12 hours prior to your test.  On the Day of the Test: . Drink plenty of water. Do not drink any water within one hour of the test. . Do not eat any food 4 hours prior to the test. . You may take your regular medications prior to the test.  . Take metoprolol (Lopressor) two hours prior to test. . FEMALES- please wear underwire-free  bra if available      After the Test: . Drink plenty of water. . After receiving IV contrast, you may experience a mild flushed feeling. This is normal. . On occasion, you may experience a mild rash up to 24 hours after the test. This is not dangerous. If this occurs, you can take Benadryl 25 mg and increase your fluid intake. . If you experience trouble breathing,  this can be serious. If it is severe call 911 IMMEDIATELY. If it is mild, please call our office. . If you take any of these medications: Glipizide/Metformin, Avandament, Glucavance, please do not take 48 hours after completing test unless otherwise instructed.   Once we have confirmed authorization from your insurance company, we will call you to set up a date and time for your test. Based on how quickly your insurance processes prior authorizations requests, please allow up to 4 weeks to be contacted for scheduling your Cardiac CT appointment. Be advised that routine Cardiac CT appointments could be scheduled as many as 8 weeks after your provider has ordered it.  For non-scheduling related questions, please contact the cardiac imaging nurse navigator should you have any questions/concerns: Marchia Bond, Cardiac Imaging Nurse Navigator Burley Saver, Interim Cardiac Imaging Nurse [place] [facility] Direct Office Dial: [phone]   For scheduling needs, including cancellations and rescheduling, please call Vivien Rota at [phone], option 3.

## 2019-10-14 NOTE — Progress Notes (Signed)
Cardiology Office Note:    Date:  10/14/2019   ID:  Martha [name], DOB 1980/06/15, MRN [id number]  PCP:  Patient, No Pcp Per  [place] Cardiologist:  No primary care provider on file.  [facility] Electrophysiologist:  None   Referring MD: No ref. provider found   Chief Complaint  Patient presents with  . 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  She was relaxed, at rest and suddenly began experiencing numbness in her left arm.  This is similar to previous episodes of paresthesias which which she has believed to be episodes of multiple sclerosis relapse, after several minutes, while the numbness sensation persisted, she felt flushing sensation of intense heat through the upper part of her body, tachycardia with a strong pounding heartbeat, followed by a sensation of chilliness, squeezing sensation in her left arm and epigastrium and subsequent numbness in both legs.  Both her heart rate and blood pressure were quite high.  She felt dizzy and near syncopal.  She lied flat but the symptoms persisted and eventually went to the emergency room.  She was concerned about the possibility of an aortic dissection because of the way the symptoms progressed.  In the emergency room she was still tachycardic, but ECG showed sinus tachycardia no ischemic changes.  Troponin was normal.  She had mild anemia which is not a new finding.  Routine labs including lipase were normal.  The squeezing sensation in her epigastrium only resolved after she was administered Phenergan.  She has not had any recurrence of the dizziness or chest discomfort since that time, but continues to have  numbness in her left forearm and hand.  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  It sounded like a vasovagal event.  She is occasionally had milder episodes since then but can promptly abbreviate them by lying down.  She has not had chest discomfort before.  She denies pleurisy, cough, hemoptysis, exertional dyspnea, orthopnea, PND.  She does not have frank edema but believes her left lower extremity is a little larger in diameter than her right.  It is not tender, tight or discolored.  She denies intermittent claudication.  She has Raynaud's syndrome.  She also has migraine headaches.  She was recently diagnosed with oral lichen planus and was briefly treated with tacrolimus, but this was stopped after she developed perioral paresthesias.  She has had problems with pain and instability in her right knee and is seeing Dr. Charlann Boxer.  She has a long history of relapsing multiple sclerosis initially diagnosed as right optic neuritis in 2009.  Subsequently she had evidence of new MS lesions by MRI, but all of her symptoms have generally been somatosensory and transient.  She stopped treatment for the multiple sclerosis between 2017 and 2021 as she was trying to become pregnant.  Unfortunately she has had 5 miscarriages, even with in vitro fertilization.  An extensive work-up has not so no reason for the miscarriages except for the presence of a PAI-1 deficiency.  Treatment of anticoagulation during pregnancy did not prevent the miscarriages, which have all been first trimester.  Just 2 weeks before  the events leading to her emergency room visit she restarted treatment for multiple sclerosis with Vumerity.  This is a new drug for her.  Her neurologist is Dr. Felecia Shelling at [facility]  Her medical history is significantly negative for hypertension, diabetes mellitus, hypercholesterolemia, smoking, DVT or PE, stroke or other clotting disorders.  She is  having regular periods now.  There is a history of coronary disease in her father.  Past Medical History:  Diagnosis Date  . Asthma   . Lichen planus   . MS (multiple sclerosis) ([place])   . PCOS (polycystic ovarian syndrome)   . Raynaud's disease     Past Surgical History:  Procedure Laterality Date  . colonoscopy    . DILATION AND CURETTAGE OF UTERUS     x 3  . ESOPHAGOGASTRODUODENOSCOPY    . TONSILLECTOMY AND ADENOIDECTOMY      Current Medications: Current Meds  Medication Sig  . aspirin EC 81 MG tablet Take 81 mg by mouth daily.  . cyclobenzaprine (FLEXERIL) 5 MG tablet Take 1 tablet (5 mg total) by mouth 3 (three) times daily as needed for muscle spasms.  . Diclofenac Sodium (PENNSAID) 2 % SOLN Place 2 g onto the skin 2 (two) times daily.  . Diroximel Fumarate (VUMERITY) 231 MG CPDR Take 231 mg by mouth in the morning and at bedtime.  Marland Kitchen esomeprazole (NEXIUM) 40 MG capsule TAKE 1 CAPSULE BY MOUTH ONCE DAILY  . naratriptan (AMERGE) 2.5 MG tablet Take 1 tablet (2.5 mg total) by mouth as needed for migraine. Take one (1) tablet at onset of headache; if returns or does not resolve, may repeat after 2 hours; do not exceed five (5) mg in 24 hours.  . ondansetron (ZOFRAN-ODT) 4 MG disintegrating tablet Take 1-2 tablets (4-8 mg total) by mouth every 8 (eight) hours as needed for nausea. Also for migraine  . promethazine (PHENERGAN) 25 MG tablet Take 1 tablet (25 mg total) by mouth every 6 (six) hours as needed for nausea or vomiting.  Marland Kitchen zolpidem (AMBIEN) 5 MG tablet Take 2.5-5 mg by mouth as needed for sleep.      Allergies:   Patient has no known allergies.   Social History   Socioeconomic History  . Marital status: Married    Spouse name: Not on file  . Number of children: 0  . Years of education: college  . Highest education level: Professional school degree (e.g., MD, DDS, DVM, JD)  Occupational History  . Occupation: physician  Tobacco Use  . Smoking status: Never Smoker    . Smokeless tobacco: Never Used  Vaping Use  . Vaping Use: Never used  Substance and Sexual Activity  . Alcohol use: No  . Drug use: No  . Sexual activity: Not on file  Other Topics Concern  . Not on file  Social History Narrative   Right handed   1-2 cups caffeine daily   Lives at home with husband   Social Determinants of Health   Financial Resource Strain:   . Difficulty of Paying Living Expenses:   Food Insecurity:   . Worried About Charity fundraiser in the Last Year:   . Arboriculturist in the Last Year:   Transportation Needs:   . Film/video editor (Medical):   Marland Kitchen Lack of Transportation (Non-Medical):   Physical Activity:   . Days of Exercise per Week:   . Minutes of Exercise per Session:   Stress:   . Feeling of Stress :  Social Connections:   . Frequency of Communication with Friends and Family:   . Frequency of Social Gatherings with Friends and Family:   . Attends Religious Services:   . Active Member of Clubs or Organizations:   . Attends Archivist Meetings:   Marland Kitchen Marital Status:      Family History: The patient's family history includes Breast cancer in her paternal aunt; CAD in her father; Depression in her father and mother; Diabetes in her father and mother; Hypercholesterolemia in her father and mother; Hypertension in her father and mother; Migraines in her mother; Pernicious anemia in her father; Thyroid disease in her father and mother.  ROS:   Please see the history of present illness.     All other systems reviewed and are negative.  EKGs/Labs/Other Studies Reviewed:    The following studies were reviewed today: Extensive notes from her emergency room visit 10/11/2019 including ECG, imaging studies and labs.  On my review of the CT angiogram of the chest abdomen and pelvis there is no evidence of atherosclerotic plaque calcification in the coronaries in the aorta or its branches.  The aorta is normal in caliber  throughout.  EKG:  EKG is  ordered today.  The ekg ordered today demonstrates normal sinus rhythm, normal tracing  Recent Labs: 10/12/2019: ALT 30; BUN 10; Creatinine, Ser 0.84; Hemoglobin 11.9; Platelets 294; Potassium 3.6; Sodium 138  Recent Lipid Panel No results found for: CHOL, TRIG, HDL, CHOLHDL, VLDL, LDLCALC, LDLDIRECT  Physical Exam:    VS:  BP 112/72   Pulse 87   Ht 5' 3" (1.6 m)   Wt 196 lb (88.9 kg)   LMP 10/05/2019   SpO2 100%   BMI 34.72 kg/m     Wt Readings from Last 3 Encounters:  10/14/19 196 lb (88.9 kg)  09/16/19 203 lb (92.1 kg)  08/24/19 204 lb (92.5 kg)     GEN:  Well nourished, well developed in no acute distress HEENT: Normal NECK: No JVD; No carotid bruits LYMPHATICS: No lymphadenopathy CARDIAC: She has a distinct early to midsystolic click that is heard best to the left of the sternal border in the fourth and fifth intercostal spaces.  It disappears with handgrip and seems to intensify with a Valsalva maneuver.  She also has a faint 1-2/6 crescendo decrescendo murmur at the left upper sternal border that does not change with provocative maneuvers.  RRR, no apical murmurs, rubs, gallops, no diastolic murmurs RESPIRATORY:  Clear to auscultation without rales, wheezing or rhonchi  ABDOMEN: Soft, non-tender, non-distended MUSCULOSKELETAL:  No edema; No deformity  SKIN: Warm and dry NEUROLOGIC:  Alert and oriented x 3 PSYCHIATRIC:  Normal affect   ASSESSMENT:    1. Murmur, cardiac   2. Precordial pain    PLAN:    In order of problems listed above:  1. Chest pain: This occurred during sinus tachycardia and markedly elevated blood pressure and has a lot of features suggestive of angina pectoris.  On the other hand, her ECG was low risk and the cardiac enzymes were normal.  She has very few cardiac risk factors other than the presence of the PAI-1 genetic mutation and her father's history of coronary problems.  She is premenopausal and serious coronary  disease would seem unlikely.  The epigastric discomfort resolved with Phenergan.  Nevertheless, her presentation remains poorly explained.  We will schedule for gated CT coronary angiogram. 2. Tachycardia: Many of the historical features of her presentation on August 8 sound like  the prodrome of an episode of neurally mediated syncope that did not reach full fruition.  She has had what sounds like vasovagal syncope in the past.  The trigger is unclear.  She knows that she should lay horizontally as soon as she feels the prodromal complaints.  The onset of tachycardia and its termination were both gradual, this did not sound like an arrhythmic event. 3. Cardiac murmur: This is faint and sounds like a cardiac flow murmur.  It does not change with provocative maneuvers.  We will check an echocardiogram. 4. Systolic click: Pretty suggestive of mitral valve prolapse, but without associated apical murmur either at rest or with provocative maneuvers.  Review on echo. 5. Raynaud syndrome: Appears to be isolated, although she has other autoimmune disorders such as multiple sclerosis and oral lichen planus, there are no features to suggest scleroderma or systemic lupus erythematosus.  Did have a mildly positive ANA 1: 80 in a speckled pattern in 2017.  The other serologies including antimitochondrial antibodies anti-smooth muscle antibody and tissue transglutaminase antibody were all negative.  It does raise the possibility that her chest pain is related to coronary spasm, but the chest pain was preceded by tachycardia, sensation of heat and hemodynamic changes suggesting the chest discomfort was secondary to the hemodynamic changes. 6. Relapsing multiple sclerosis: Recently restarted treatment, this time with Vumerity, drug that commonly causes flushing (40%).  The relationship with her clinical chest pain/near syncope syndrome is unclear. 7. Mild microcytic anemia: Statistically most likely to represent iron  deficiency, especially in a premenopausal woman, but the RBC count is actually high.  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   Medication Adjustments/Labs and Tests Ordered: Current medicines are reviewed at length with the patient today.  Concerns regarding medicines are outlined above.  Orders Placed This Encounter  Procedures  . CT CORONARY MORPH W/CTA COR W/SCORE W/CA W/CM &/OR WO/CM  . CT CORONARY FRACTIONAL FLOW RESERVE DATA PREP  . CT CORONARY FRACTIONAL FLOW RESERVE FLUID ANALYSIS  . EKG 12-Lead  . ECHOCARDIOGRAM COMPLETE   Meds ordered this encounter  Medications  . metoprolol tartrate (LOPRESSOR) 100 MG tablet    Sig: Take within two hours of the test    Dispense:  1 tablet    Refill:  0    Patient Instructions  Medication Instructions:  No changes *If you need a refill on your cardiac medications before your next appointment, please call your pharmacy*   Lab Work: None ordered If you have labs (blood work) drawn today and your tests are completely normal, you will receive your results only by: Marland Kitchen MyChart Message (if you have MyChart) OR . A paper copy in the mail If you have any lab test that is abnormal or we need to change your treatment, we will call you to review the results.   Testing/Procedures: Your physician has requested that you have an echocardiogram. Echocardiography is a painless test that uses sound waves to create images of your heart. It provides your doctor with information about the size and shape of your heart and how well your heart's chambers and valves are working. You may receive an ultrasound enhancing agent through an IV if needed to better visualize your heart during the echo.This procedure takes approximately one hour. There are no restrictions for this procedure. This will take place at the [address], [address].   Your physician has requested that you have  cardiac CT.  Cardiac computed tomography (CT) is a painless test that uses an x-ray machine to take clear, detailed pictures of your heart.. Please follow instruction sheet as given.   Follow-Up: At [facility], you and your health needs are our priority.  As part of our continuing mission to provide you with exceptional heart care, we have created designated Provider Care Teams.  These Care Teams include your primary Cardiologist (physician) and Advanced Practice Providers (APPs -  Physician Assistants and Nurse Practitioners) who all work together to provide you with the care you need, when you need it.  We recommend signing up for the patient portal called "MyChart".  Sign up information is provided on this After Visit Summary.  MyChart is used to connect with patients for Virtual Visits (Telemedicine).  Patients are able to view lab/test results, encounter notes, upcoming appointments, etc.  Non-urgent messages can be sent to your provider as well.   To learn more about what you can do with MyChart, go to [email or web address].    Your next appointment:   Follow up as needed with Dr. Sallyanne Kuster   Other Instructions Your cardiac CT will be scheduled at one of the below locations:   [facility] [address] [place], [place] [address] [phone]  [place] [address] [place],  [address] [phone]  If scheduled at [facility], please arrive at the [facility] main entrance of [facility] 30 minutes prior to test start time. Proceed to the [facility] Radiology Department (first floor) to check-in and test prep.  If scheduled at [facility], please arrive 15 mins early for check-in and test prep.  Please follow these instructions carefully (unless otherwise directed):  On the Night Before the Test: . Be sure to Drink plenty of water. . Do not consume any  caffeinated/decaffeinated beverages or chocolate 12 hours prior to your test. . Do not take any antihistamines 12 hours prior to your test.  On the Day of the Test: . Drink plenty of water. Do not drink any water within one hour of the test. . Do not eat any food 4 hours prior to the test. . You may take your regular medications prior to the test.  . Take metoprolol (Lopressor) two hours prior to test. . FEMALES- please wear underwire-free bra if available      After the Test: . Drink plenty of water. . After receiving IV contrast, you may experience a mild flushed feeling. This is normal. . On occasion, you may experience a mild rash up to 24 hours after the test. This is not dangerous. If this occurs, you can take Benadryl 25 mg and increase your fluid intake. . If you experience trouble breathing, this can be serious. If it is severe call 911 IMMEDIATELY. If it is mild, please call our office. . If you take any of these medications: Glipizide/Metformin, Avandament, Glucavance, please do not take 48 hours after completing test unless otherwise instructed.   Once we have confirmed authorization from your insurance company, we will call you to set up a date and time for your test. Based on how quickly your insurance processes prior authorizations requests, please allow up to 4 weeks to be contacted for scheduling your Cardiac CT appointment. Be advised that routine Cardiac CT appointments could be scheduled as many as 8 weeks after your provider has ordered it.  For non-scheduling related questions, please contact the cardiac imaging  nurse navigator should you have any questions/concerns: Marchia Bond, Cardiac Imaging Nurse Navigator Burley Saver, Interim Cardiac Imaging Nurse Navigator [facility] Direct Office Dial: [phone]   For scheduling needs, including cancellations and rescheduling, please call Vivien Rota at [phone], option 3.        Signed, Sanda Klein, MD  10/14/2019 4:04 PM    [place]

## 2019-10-20 ENCOUNTER — Encounter: Payer: Self-pay | Admitting: Family Medicine

## 2019-10-20 ENCOUNTER — Ambulatory Visit (INDEPENDENT_AMBULATORY_CARE_PROVIDER_SITE_OTHER): Payer: 59 | Admitting: Family Medicine

## 2019-10-20 ENCOUNTER — Other Ambulatory Visit: Payer: Self-pay

## 2019-10-20 VITALS — BP 110/76 | HR 105 | Ht 63.0 in | Wt 193.0 lb

## 2019-10-20 DIAGNOSIS — D509 Iron deficiency anemia, unspecified: Secondary | ICD-10-CM | POA: Diagnosis not present

## 2019-10-20 DIAGNOSIS — M999 Biomechanical lesion, unspecified: Secondary | ICD-10-CM | POA: Diagnosis not present

## 2019-10-20 DIAGNOSIS — M546 Pain in thoracic spine: Secondary | ICD-10-CM | POA: Diagnosis not present

## 2019-10-20 DIAGNOSIS — G8929 Other chronic pain: Secondary | ICD-10-CM | POA: Diagnosis not present

## 2019-10-20 NOTE — Progress Notes (Signed)
[place] Phone: [phone] Subjective:   [name], am serving as a scribe for Dr. Hulan Saas. This visit occurred during the SARS-CoV-2 public health emergency.  Safety protocols were in place, including screening questions prior to the visit, additional usage of staff PPE, and extensive cleaning of exam room while observing appropriate contact time as indicated for disinfecting solutions.   I'm seeing this patient by the request  of:  Patient, No Pcp Per  CC: Back pain follow-up  [id number]  Martha [name] is a 39 y.o. female coming in with complaint of back and neck pain. OMT 09/16/2019. Patient states that she has been having tightness in cervical spine. Muscle relaxer which has helped.  Patient has a past medical history of multiple sclerosis.  Medications patient has been prescribed: Zanaflex  Taking: Yes    Review of patient's chart shows that patient did have a near syncopal episode is what they called it when she went to the emergency room on October 11, 2019.  Patient initially stated approximately 8 or 8:30 at night was watching TV and started having left arm squeezing sensation with numbness that continues to have a round numbness sensation on her forearm.  Then he started having a squeezing sensation in her abdomen that was followed by radiation down both legs.  Patient denied any significant back pain more than usual.  Patient did call EMS found to have systolic blood pressure in the 150s to 160s with heart rates in the 140s.  Patient had some mild nausea with it but denies any vomiting and some mild loose stools did not anything that was abnormal.  Significant work-up was noted.  Reviewed patient's imaging including CT angio of the chest abdomen and pelvis that was negative for any type of dissection or aneurysm, laboratory work-up did show a hypocalcemia mild anemia with hemoglobin of 11.9 that  appears to be microcytic negative troponins patient has follow-up with cardiology     Reviewed prior external information including notes and imaging from previsou exam, outside providers and external EMR if available.   As well as notes that were available from care everywhere and other healthcare systems.  Past medical history, social, surgical and family history all reviewed in electronic medical record.  No pertanent information unless stated regarding to the chief complaint.   Past Medical History:  Diagnosis Date  . Asthma   . Lichen planus   . MS (multiple sclerosis) ([place])   . PCOS (polycystic ovarian syndrome)   . Raynaud's disease     No Known Allergies   Review of Systems:  No headache, visual changes, nausea, vomiting, diarrhea, constipation, dizziness, abdominal pain, skin rash, fevers, chills, night sweats, weight loss, swollen lymph nodes, body aches, joint swelling, chest pain, shortness of breath, mood changes. POSITIVE muscle aches  Objective  Blood pressure 110/76, pulse (!) 105, height 5\' 3"  (1.6 m), weight 193 lb (87.5 kg), last menstrual period 10/05/2019, SpO2 98 %.   General: No apparent distress alert and oriented x3 mood and affect normal, dressed appropriately.  HEENT: Pupils equal, extraocular movements intact  Respiratory: Patient's speak in full sentences and does not appear short of breath  Cardiovascular: No lower extremity edema, non tender, no erythema  Gait normal with good balance and coordination.  MSK:  Non tender with full range of motion and good stability and symmetric strength and tone of shoulders, elbows, wrist, hip, knee and ankles bilaterally.  Back -Paraspinous muscles are mildly tender and without spasm.   Range of motion is full at neck and lumbar sacral regions no signs of any true neurologic difficulties except for the forearm on the left side  Osteopathic findings  T3 extended rotated and side bent right inhaled rib T8 extended  rotated and side bent left L2 flexed rotated and side bent right        Assessment and Plan: Thoracic back pain Chronic pain and seems to be stable.  Patient did have a significant episode but still has an etiology that is unclear.  Possible a association with her multiple sclerosis with some of her mild numbness of the forearm. Patient is seen cardiology and is having further work-up including a CT scan and ultrasound.  Depending on findings if continuing to have difficulty would consider the possibility of MRI of the brain again for further evaluation for any new lesions.  Negative patient's medication list I think it is highly unlikely that this is medication.  Behcet syndrome is within the differential and may need to consider further work-up for this as well with patient having potentially some neurologic complications.  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  We will continue to monitor and continuously patient who does respond well to manipulation.  Follow-up again in 6 weeks  Hypocalcemia Hypocalcemia on her laboratory work-up.  May need to consider the further evaluation of this including PTH and thyroid can discuss labs at follow-up  Microcytic anemia Can consider potential labs at follow-up as well     Nonallopathic problems  Decision today to treat with OMT was based on Physical Exam  After verbal consent patient was treated with HVLA, ME, FPR techniques in  rib, thoracic, lumbar, and reas  Patient tolerated the procedure well with improvement in symptoms  Patient given exercises, stretches and lifestyle modifications  See medications in patient instructions if given  Patient will follow up in 4-8 weeks      The above documentation has been reviewed and is accurate and complete Martha Pulley, DO       Note: This dictation was prepared with Dragon dictation along with smaller  phrase technology. Any transcriptional errors that result from this process are unintentional.

## 2019-10-21 ENCOUNTER — Encounter: Payer: Self-pay | Admitting: Family Medicine

## 2019-10-21 DIAGNOSIS — D509 Iron deficiency anemia, unspecified: Secondary | ICD-10-CM | POA: Insufficient documentation

## 2019-10-21 MED FILL — ZOLPIDEM TARTRATE 10 MG TAB: 10 | 30 days supply | Qty: 30 | Fill #2

## 2019-10-21 NOTE — Assessment & Plan Note (Signed)
Can consider potential labs at follow-up as well

## 2019-10-21 NOTE — Assessment & Plan Note (Signed)
Chronic pain and seems to be stable.  Patient did have a significant episode but still has an etiology that is unclear.  Possible a association with her multiple sclerosis with some of her mild numbness of the forearm. Patient is seen cardiology and is having further work-up including a CT scan and ultrasound.  Depending on findings if continuing to have difficulty would consider the possibility of MRI of the brain again for further evaluation for any new lesions.  Negative patient's medication list I think it is highly unlikely that this is medication.  Behcet syndrome is within the differential and may need to consider further work-up for this as well with patient having potentially some neurologic complications.  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  We will continue to monitor and continuously patient who does respond well to manipulation.  Follow-up again in 6 weeks

## 2019-10-21 NOTE — Assessment & Plan Note (Signed)
Hypocalcemia on her laboratory work-up.  May need to consider the further evaluation of this including PTH and thyroid can discuss labs at follow-up

## 2019-10-28 ENCOUNTER — Other Ambulatory Visit: Payer: Self-pay

## 2019-10-28 ENCOUNTER — Ambulatory Visit (HOSPITAL_COMMUNITY): Payer: 59 | Attending: Internal Medicine

## 2019-10-28 ENCOUNTER — Other Ambulatory Visit: Payer: Self-pay | Admitting: Neurology

## 2019-10-28 DIAGNOSIS — R011 Cardiac murmur, unspecified: Secondary | ICD-10-CM | POA: Diagnosis not present

## 2019-10-28 DIAGNOSIS — G35 Multiple sclerosis: Secondary | ICD-10-CM

## 2019-10-28 LAB — ECHOCARDIOGRAM COMPLETE
Area-P 1/2: 3.99 cm2
S' Lateral: 2.9 cm

## 2019-10-29 ENCOUNTER — Telehealth: Payer: Self-pay | Admitting: Neurology

## 2019-10-29 NOTE — Telephone Encounter (Signed)
I called patient and got her scheduled for MRI Brain w/wo contrast, MRI Cervical w/wo contrast 90 mins. [facility] NPA reference [phone]. FYI

## 2019-10-30 NOTE — Telephone Encounter (Signed)
Noted, thank you

## 2019-11-03 ENCOUNTER — Other Ambulatory Visit: Payer: Self-pay

## 2019-11-03 ENCOUNTER — Ambulatory Visit: Payer: 59

## 2019-11-03 DIAGNOSIS — G35 Multiple sclerosis: Secondary | ICD-10-CM

## 2019-11-03 MED ORDER — GADOBENATE DIMEGLUMINE 529 MG/ML IV SOLN
19.0000 mL | Freq: Once | INTRAVENOUS | Status: AC | PRN
  Administered 2019-11-03: 19 mL via INTRAVENOUS

## 2019-11-10 ENCOUNTER — Telehealth (HOSPITAL_COMMUNITY): Payer: Self-pay | Admitting: Emergency Medicine

## 2019-11-10 NOTE — Telephone Encounter (Signed)
Reaching out to patient to offer assistance regarding upcoming cardiac imaging study; pt verbalizes understanding of appt date/time, parking situation and where to check in, pre-test NPO status and medications ordered, and verified current allergies; name and call back number provided for further questions should they arise Harjas Biggins RN Navigator Cardiac Imaging [facility] [phone] office [phone] cell 

## 2019-11-11 ENCOUNTER — Other Ambulatory Visit: Payer: Self-pay

## 2019-11-11 ENCOUNTER — Ambulatory Visit (HOSPITAL_COMMUNITY)
Admission: RE | Admit: 2019-11-11 | Discharge: 2019-11-11 | Disposition: A | Discharge status: Home / Self Care | Payer: 59 | Source: Ambulatory Visit | Attending: Cardiovascular Disease | Admitting: Cardiovascular Disease

## 2019-11-11 ENCOUNTER — Encounter (HOSPITAL_COMMUNITY): Payer: Self-pay

## 2019-11-11 DIAGNOSIS — R072 Precordial pain: Secondary | ICD-10-CM | POA: Insufficient documentation

## 2019-11-11 MED ORDER — IOHEXOL 350 MG/ML SOLN
80.0000 mL | Freq: Once | INTRAVENOUS | Status: AC | PRN
  Administered 2019-11-11: 80 mL via INTRAVENOUS

## 2019-11-11 MED ORDER — NITROGLYCERIN 0.4 MG SL SUBL
0.8000 mg | SUBLINGUAL_TABLET | Freq: Once | SUBLINGUAL | Status: AC
  Administered 2019-11-11: 0.8 mg via SUBLINGUAL

## 2019-11-11 MED ORDER — NITROGLYCERIN 0.4 MG SL SUBL
SUBLINGUAL_TABLET | SUBLINGUAL | Status: AC
  Filled 2019-11-11: qty 2

## 2019-11-11 NOTE — Discharge Instructions (Signed)
CT Angiogram  A CT angiogram is a procedure to look at the blood vessels in various areas of the body. For this procedure, a large X-ray machine, called a CT scanner, takes detailed pictures of blood vessels that have been injected with a dye (contrast material). A CT angiogram allows your health care provider to see how well blood is flowing to the area of your body that is being checked. Your health care provider will be able to see if there are any problems, such as a blockage. Tell a health care provider about:  Any allergies you have.  All medicines you are taking, including vitamins, herbs, eye drops, creams, and over-the-counter medicines.  Any problems you or family members have had with anesthetic medicines.  Any blood disorders you have.  Any surgeries you have had.  Any medical conditions you have.  Whether you are pregnant or may be pregnant.  Whether you are breastfeeding.  Any anxiety disorders, chronic pain, or other conditions you have that may increase your stress or prevent you from lying still. What are the risks? Generally, this is a safe procedure. However, problems may occur, including:  Infection.  Bleeding.  Allergic reactions to medicines or dyes.  Damage to other structures or organs.  Kidney damage from the dye or contrast that is used.  Increased risk of cancer from radiation exposure. This risk is low. Talk with your health care provider about: ? The risks and benefits of testing. ? How you can receive the lowest dose of radiation. What happens before the procedure?  Wear comfortable clothing and remove any jewelry.  Follow instructions from your health care provider about eating and drinking. For most people, instructions may include these actions: ? For 12 hours before the test, avoid caffeine. This includes tea, coffee, soda, and energy drinks or pills. ? For 3-4 hours before the test, stop eating or drinking anything but water. ? Stay  well hydrated by continuing to drink water before the exam. This will help to clear the contrast dye from your body after the test.  Ask your health care provider about changing or stopping your regular medicines. This is especially important if you are taking diabetes medicines or blood thinners. What happens during the procedure?  An IV tube will be inserted into one of your veins.  You will be asked to lie on an exam table. This table will slide in and out of the CT machine during the procedure.  Contrast dye will be injected into the IV tube. You might feel warm, or you may get a metallic taste in your mouth.  The table that you are lying on will move into the CT machine tunnel for the scan.  The person running the machine will give you instructions while the scans are being done. You may be asked to: ? Keep your arms above your head. ? Hold your breath. ? Stay very still, even if the table is moving.  When the scanning is complete, you will be moved out of the machine.  The IV tube will be removed. The procedure may vary among health care providers and hospitals. What happens after the procedure?  You might feel warm, or you may get a metallic taste in your mouth.  You may be asked to drink water or other fluids to wash (flush) the contrast material out of your body.  It is up to you to get the results of your procedure. Ask your health care provider, or the department   that is doing the procedure, when your results will be ready. Summary  A CT angiogram is a procedure to look at the blood vessels in various areas of the body.  You will need to stay very still during the exam.  You may be asked to drink water or other fluids to wash (flush) the contrast material out of your body after your scan. This information is not intended to replace advice given to you by your health care provider. Make sure you discuss any questions you have with your health care provider. Document  Revised: 05/01/2018 Document Reviewed: 10/20/2015 Elsevier Patient Education  [place] IV Contrast Material IV contrast material is a fluid that is used with some imaging tests. It is injected into your body through a vein. Contrast material is used when your health care providers need a detailed look at organs, tissues, or blood vessels that may not show up with the standard test. The material may be used when an X-ray, an MRI, a CT scan, or an ultrasound is done. IV contrast material may be used for imaging tests that check:  Muscles, skin, and fat.  Breasts.  Brain.  Digestive tract.  Heart.  Organs such as the liver, kidneys, lungs, bladder, and many others.  Arteries and veins. Tell a health care provider about:  Any allergies you have, especially an allergy to contrast material.  All medicines you are taking, including metformin, beta blockers, NSAIDs (such as ibuprofen), interleukin-2, vitamins, herbs, eye drops, creams, and over-the-counter medicines.  Any problems you or family members have had with the use of contrast material.  Any blood disorders you have, such as sickle cell anemia.  Any surgeries you have had.  Any medical conditions you have or have had, especially alcohol abuse, dehydration, asthma, or kidney, liver, or heart problems.  Whether you are pregnant or may be pregnant.  Whether you are breastfeeding. Most contrast materials are safe for use in breastfeeding women. What are the risks? Generally, this is a safe procedure. However, problems may occur, including:  Headache.  Itching, skin rash, and hives.  Nausea and vomiting.  Allergic reactions.  Wheezing or difficulty breathing.  Abnormal heart rate.  Changes in blood pressure.  Throat swelling.  Kidney damage. What happens before the procedure? Medicines Ask your health care provider about:  Changing or stopping your regular medicines. This is especially  important if you are taking diabetes medicines or blood thinners.  Taking medicines such as aspirin and ibuprofen. These medicines can thin your blood. Do not take these medicines unless your health care provider tells you to take them.  Taking over-the-counter medicines, vitamins, herbs, and supplements. If you are at risk of having a reaction to the IV contrast material, you may be asked to take medicine before the procedure to prevent a reaction. General instructions  Follow instructions from your health care provider about eating or drinking restrictions.  You may have an exam or lab tests to make sure that you can safely get IV contrast material.  Ask if you will be given a medicine to help you relax (sedative) during the procedure. If so, plan to have someone take you home from the hospital or clinic. What happens during the procedure?  You may be given a sedative to help you relax.  An IV will be inserted into one of your veins.  Contrast material will be injected into your IV.  You may feel warmth or flushing as the contrast material  enters your bloodstream.  You may have a metallic taste in your mouth for a few minutes.  The needle may cause some discomfort and bruising.  After the contrast material is in your body, the imaging test will be done. The procedure may vary among health care providers and hospitals. What can I expect after the procedure?  The IV will be removed.  You may be taken to a recovery area if sedation medicines were used. Your blood pressure, heart rate, breathing rate, and blood oxygen level will be monitored until you leave the hospital or clinic. Follow these instructions at home:   Take over-the-counter and prescription medicines only as told by your health care provider. ? Your health care provider may tell you to not take certain medicines for a couple of days after the procedure. This is especially important if you are taking diabetes  medicines.  If you are told, drink enough fluid to keep your urine pale yellow. This will help to remove the contrast material out of your body.  Do not drive for 24 hours if you were given a sedative during your procedure.  It is up to you to get the results of your procedure. Ask your health care provider, or the department that is doing the procedure, when your results will be ready.  Keep all follow-up visits as told by your health care provider. This is important. Contact a health care provider if:  You have redness, swelling, or pain near your IV site. Get help right away if:  You have an abnormal heart rhythm.  You have trouble breathing.  You have: ? Chest pain. ? Pain in your back, neck, arm, jaw, or stomach. ? Nausea or sweating. ? Hives or a rash.  You start shaking and cannot stop. These symptoms may represent a serious problem that is an emergency. Do not wait to see if the symptoms will go away. Get medical help right away. Call your local emergency services (911 in the [place] Do not drive yourself to the hospital. Summary  IV contrast material may be used for imaging tests to help your health care providers see your organs and tissues more clearly.  Tell your health care provider if you are pregnant or may be pregnant.  During the procedure, you may feel warmth or flushing as the contrast material enters your bloodstream.  After the procedure, drink enough fluid to keep your urine pale yellow. This information is not intended to replace advice given to you by your health care provider. Make sure you discuss any questions you have with your health care provider. Document Revised: 05/08/2018 Document Reviewed: 05/08/2018 Elsevier Patient Education  [place].

## 2019-11-12 ENCOUNTER — Ambulatory Visit (HOSPITAL_COMMUNITY): Payer: 59

## 2019-11-23 MED FILL — ZOLPIDEM TARTRATE 10 MG TAB: 10 | 30 days supply | Qty: 30 | Fill #3

## 2019-11-30 MED FILL — ONDANSETRON ODT 4 MG TABLET: 4 | 5 days supply | Qty: 30 | Fill #1

## 2019-12-01 ENCOUNTER — Ambulatory Visit (INDEPENDENT_AMBULATORY_CARE_PROVIDER_SITE_OTHER): Payer: 59 | Admitting: Family Medicine

## 2019-12-01 ENCOUNTER — Other Ambulatory Visit: Payer: Self-pay

## 2019-12-01 ENCOUNTER — Encounter: Payer: Self-pay | Admitting: Family Medicine

## 2019-12-01 VITALS — BP 100/70 | HR 91 | Ht 63.0 in | Wt 195.0 lb

## 2019-12-01 DIAGNOSIS — M546 Pain in thoracic spine: Secondary | ICD-10-CM

## 2019-12-01 DIAGNOSIS — M999 Biomechanical lesion, unspecified: Secondary | ICD-10-CM

## 2019-12-01 DIAGNOSIS — G8929 Other chronic pain: Secondary | ICD-10-CM | POA: Diagnosis not present

## 2019-12-01 MED FILL — WEGOVY 0.5 MG/0.5ML SOAJ: 0.5 | 28 days supply | Qty: 2 | Fill #0

## 2019-12-01 NOTE — Assessment & Plan Note (Signed)
Continue mild thoracic back pain.  Seems to be still more secondary to posture and anomalies, responds very well to osteopathic manipulation though.  We discussed posture, working positions, patient will follow up with me again 5 to 6 weeks

## 2019-12-01 NOTE — Progress Notes (Signed)
[facility] [address] [place] Phone: [phone] Subjective:   I Martha [name] am serving as a Education administrator for Dr. Hulan Saas.  This visit occurred during the SARS-CoV-2 public health emergency.  Safety protocols were in place, including screening questions prior to the visit, additional usage of staff PPE, and extensive cleaning of exam room while observing appropriate contact time as indicated for disinfecting solutions.   I'm seeing this patient by the request  of:  Patient, No Pcp Per  CC: Neck pain and back pain follow-up  [id number]  Martha [name] is a 39 y.o. female coming in with complaint of back and neck pain. OMT 10/24/2019. Patient states she is doing well today.  Patient did get her booster shot for the COVID vaccine.  Did have 2 days of not feeling good and some mild increase in tightness but nothing severe.  Has been doing a lot of work around the house and has traveled recently but has not been working as much.  Medications patient has been prescribed: Flexeril  Taking: Intermittently      Patient does have a history of MS and was found to have a new lesion recently but does not know if that was what gave her the acute symptoms that previously did put patient in the emergency room.   Reviewed prior external information including notes and imaging from previsou exam, outside providers and external EMR if available.   As well as notes that were available from care everywhere and other healthcare systems.  Past medical history, social, surgical and family history all reviewed in electronic medical record.  No pertanent information unless stated regarding to the chief complaint.   Past Medical History:  Diagnosis Date  . Asthma   . Lichen planus   . MS (multiple sclerosis) ([place])   . PCOS (polycystic ovarian syndrome)   . Raynaud's disease     No Known Allergies   Review of Systems:  No headache, visual  changes, nausea, vomiting, diarrhea, constipation, dizziness, abdominal pain, skin rash, fevers, chills, night sweats, weight loss, swollen lymph nodes, body aches, joint swelling, chest pain, shortness of breath, mood changes. POSITIVE muscle aches  Objective  Blood pressure 100/70, pulse 91, height 5\' 3"  (1.6 m), weight 195 lb (88.5 kg), SpO2 99 %.   General: No apparent distress alert and oriented x3 mood and affect normal, dressed appropriately.  HEENT: Pupils equal, extraocular movements intact  Respiratory: Patient's speak in full sentences and does not appear short of breath  Cardiovascular: No lower extremity edema, non tender, no erythema  Neuro: Cranial nerves II through XII are intact, neurovascularly intact in all extremities with 2+ DTRs and 2+ pulses.  Gait normal with good balance and coordination.  MSK:  Non tender with full range of motion and good stability and symmetric strength and tone of shoulders, elbows, wrist, hip, knee and ankles bilaterally.  Back -back exam does show some mild loss of lordosis, some tenderness to palpation of the paraspinal musculature of the lumbar spine right greater than left.   Osteopathic findings  T3 extended rotated and side bent right inhaled rib T6 extended rotated and side bent left L2 flexed rotated and side bent right Sacrum right on right       Assessment and Plan:  Thoracic back pain Continue mild thoracic back pain.  Seems to be still more secondary to posture and anomalies, responds very well to osteopathic manipulation though.  We discussed posture, working  positions, patient will follow up with me again 5 to 6 weeks     Nonallopathic problems  Decision today to treat with OMT was based on Physical Exam  After verbal consent patient was treated with HVLA, ME, FPR techniques in  rib, thoracic, lumbar, and sacral  areas  Patient tolerated the procedure well with improvement in symptoms  Patient given exercises,  stretches and lifestyle modifications  See medications in patient instructions if given  Patient will follow up in 4-8 weeks      The above documentation has been reviewed and is accurate and complete Lyndal Pulley, DO       Note: This dictation was prepared with Dragon dictation along with smaller phrase technology. Any transcriptional errors that result from this process are unintentional.

## 2019-12-09 ENCOUNTER — Encounter: Payer: Self-pay | Admitting: *Deleted

## 2019-12-09 ENCOUNTER — Other Ambulatory Visit (HOSPITAL_COMMUNITY): Payer: Self-pay | Admitting: Internal Medicine

## 2019-12-09 MED FILL — FLUARIX QUADRIVALENT 0.5 ML: 0.5 | 1 days supply | Qty: 1 | Fill #0

## 2019-12-24 DIAGNOSIS — Z01419 Encounter for gynecological examination (general) (routine) without abnormal findings: Secondary | ICD-10-CM | POA: Diagnosis not present

## 2019-12-24 DIAGNOSIS — Z6832 Body mass index (BMI) 32.0-32.9, adult: Secondary | ICD-10-CM | POA: Diagnosis not present

## 2020-01-06 ENCOUNTER — Encounter: Payer: Self-pay | Admitting: Family Medicine

## 2020-01-06 ENCOUNTER — Other Ambulatory Visit: Payer: Self-pay

## 2020-01-06 ENCOUNTER — Ambulatory Visit (INDEPENDENT_AMBULATORY_CARE_PROVIDER_SITE_OTHER): Payer: 59 | Admitting: Family Medicine

## 2020-01-06 VITALS — BP 100/70 | Ht 65.0 in | Wt 191.0 lb

## 2020-01-06 DIAGNOSIS — M999 Biomechanical lesion, unspecified: Secondary | ICD-10-CM

## 2020-01-06 DIAGNOSIS — G8929 Other chronic pain: Secondary | ICD-10-CM | POA: Diagnosis not present

## 2020-01-06 DIAGNOSIS — M546 Pain in thoracic spine: Secondary | ICD-10-CM

## 2020-01-06 MED FILL — ZOLPIDEM TARTRATE 10 MG TAB: 10 | 30 days supply | Qty: 30 | Fill #4

## 2020-01-06 MED FILL — WEGOVY 1 MG/0.5ML SOAJ: 1 | 28 days supply | Qty: 2 | Fill #0

## 2020-01-06 NOTE — Progress Notes (Signed)
[facility] [address] [place] Phone: [phone] Subjective:   I Martha [name] am serving as a Education administrator for Dr. Hulan Saas.  This visit occurred during the SARS-CoV-2 public health emergency.  Safety protocols were in place, including screening questions prior to the visit, additional usage of staff PPE, and extensive cleaning of exam room while observing appropriate contact time as indicated for disinfecting solutions.   I'm seeing this patient by the request  of:  Patient, No Pcp Per  CC: Back pain and neck pain follow-up  [id number]  Martha [name] is a 39 y.o. female coming in with complaint of back and neck pain. OMT 12/01/2019. Patient states she is doing well.  Patient very intermittently has to use any other medication such as the Flexeril. Patient does have more tightness.  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  Concerned that might cause an exacerbation.        Reviewed prior external information including notes and imaging from previsou exam, outside providers and external EMR if available.   As well as notes that were available from care everywhere and other healthcare systems.  Past medical history, social, surgical and family history all reviewed in electronic medical record.  No pertanent information unless stated regarding to the chief complaint.   Past Medical History:  Diagnosis Date  . Asthma   . Lichen planus   . MS (multiple sclerosis) ([place])   . PCOS (polycystic ovarian syndrome)   . Raynaud's disease     No Known Allergies   Review of Systems:  No headache, visual changes, nausea, vomiting, diarrhea, constipation, dizziness, abdominal pain, skin rash, fevers, chills, night sweats, weight loss, swollen lymph nodes, body aches, joint swelling, chest pain, shortness of breath, mood changes. POSITIVE muscle aches  Objective  Blood pressure 100/70, height 5\' 5"  (1.651 m),  weight 191 lb (86.6 kg).   General: No apparent distress alert and oriented x3 mood and affect normal, dressed appropriately.  HEENT: Pupils equal, extraocular movements intact  Respiratory: Patient's speak in full sentences and does not appear short of breath  Cardiovascular: No lower extremity edema, non tender, no erythema  Gait normal with good balance and coordination.  Back increasing tightness in the L3-L4 L4-L5 left side and right.  Little different than patient's previous exam.  Negative straight leg test.  Mild pain with increased extension.  Osteopathic findings   T3 extended rotated and side bent right inhaled rib T9 extended rotated and side bent left L2 flexed rotated and side bent left L5 flexed rotated and side bent right       Assessment and Plan:   Thoracic back pain Discussed posture and ergonomics.  Discussed with x-rays today which wants to avoid.  Has responded fairly well to osteopathic manipulation.  Follow-up again 6 to 8 weeks    Nonallopathic problems  Decision today to treat with OMT was based on Physical Exam  After verbal consent patient was treated with HVLA, ME, FPR techniques in  rib, thoracic, lumbar areas  Patient tolerated the procedure well with improvement in symptoms  Patient given exercises, stretches and lifestyle modifications  See medications in patient instructions if given  Patient will follow up in 4-8 weeks      The above documentation has been reviewed and is accurate and complete Lyndal Pulley, DO       Note: This dictation was prepared with Dragon dictation along with smaller  Company secretary. Any transcriptional errors that result from this process are unintentional.

## 2020-01-06 NOTE — Assessment & Plan Note (Signed)
Discussed posture and ergonomics.  Discussed with x-rays today which wants to avoid.  Has responded fairly well to osteopathic manipulation.  Follow-up again 6 to 8 weeks

## 2020-01-06 NOTE — Patient Instructions (Signed)
Good to see you See me again in 6-8 weeks 

## 2020-01-07 ENCOUNTER — Ambulatory Visit (INDEPENDENT_AMBULATORY_CARE_PROVIDER_SITE_OTHER): Payer: 59 | Admitting: Neurology

## 2020-01-07 ENCOUNTER — Encounter: Payer: Self-pay | Admitting: Neurology

## 2020-01-07 ENCOUNTER — Other Ambulatory Visit: Payer: Self-pay | Admitting: Neurology

## 2020-01-07 VITALS — BP 123/86 | HR 80 | Ht 65.0 in | Wt 191.7 lb

## 2020-01-07 DIAGNOSIS — G35 Multiple sclerosis: Secondary | ICD-10-CM | POA: Diagnosis not present

## 2020-01-07 DIAGNOSIS — G43001 Migraine without aura, not intractable, with status migrainosus: Secondary | ICD-10-CM

## 2020-01-07 DIAGNOSIS — R208 Other disturbances of skin sensation: Secondary | ICD-10-CM | POA: Diagnosis not present

## 2020-01-07 DIAGNOSIS — Z79899 Other long term (current) drug therapy: Secondary | ICD-10-CM | POA: Diagnosis not present

## 2020-01-07 MED ORDER — RIZATRIPTAN BENZOATE 5 MG PO TABS
5.0000 mg | ORAL_TABLET | ORAL | 3 refills | Status: DC | PRN
Start: 2020-01-07 — End: 2020-01-07

## 2020-01-07 MED ORDER — ZOLPIDEM TARTRATE 10 MG PO TABS: ORAL_TABLET | ORAL | 5 refills | Status: DC

## 2020-01-07 MED FILL — RIZATRIPTAN 5 MG TABLET: 5 | 30 days supply | Qty: 10 | Fill #0

## 2020-01-07 NOTE — Progress Notes (Signed)
[facility]   PATIENT: Martha [name] DOB: 11/20/1980  REFERRING DOCTOR OR PCP:  [name] [facility]) SOURCE: Patient, notes from neurology, laboratory reports, imaging reports, MRI images personally reviewed.  _________________________________   HISTORICAL  CHIEF COMPLAINT:  Chief Complaint  Patient presents with  . Follow-up    RM 13, alone. Last seen 08/24/19.   . 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 woman with multiple sclerosis.  Update 01/07/2020: She is on Vumerity and tolerates it well.   She had mild flushing but no GI upset.   No recent flushing event.     In August 2021, she had left arm paresthesias and then had tachycardia and hypertension (160/110).   She also had a squeezing sensation, cold sensation and numbness in her legs.  The whole episode lasted 6 hours and then completely resolved.   She had a couple hypotensive episodes.    She had a cardiac evaluation including Echo.      Currently, she is noting some fatigue and occasional brain fog but denies numbness, weakness or clumsiness.  She is having migraines every couple months.    Maxalt helped her but made her feel sleepy.   Naratriptan caused nausea and did not reduce the migraine..   Her last migraine was associated with the booster.    MRI brain 11/03/2019 showed multiple T2/FLAIR hyperintense foci in the periventricular, juxtacortical and deep white matter of both hemispheres consistent with demyelinating plaque associated with multiple sclerosis. Most of the foci were present on the 12/05/2018 MRI. However, there is one new juxtacortical focus in the right hemisphere. That focus also enhances after contrast implying a more acute or subacute timeframe.  MRI cervical spine 11/03/2019 showed a normal spinal cord and no significant DJD  MS History She was diagnosed with MS in 2009 after presenting with right optic  neuritis.  She initially saw ophthalmology and was referred to neuro-ophthalmology (Dr. [name]).  After an MRI showed changes consistent with MS, she was referred to Dr. [name], at [facility] neurology.     She was placed on Avonex and tolerated it well.   Due to LFT elevation, she switched to Copaxone.    She had tolerability issues and switched to Tecfidera in 2013.  She had some mild liver elevation.  Her MS was stable on all of these medications.  She stopped Tecfidera in 2017 due to family planning issues.    She has not been able to get pregnant and is ready to restart therapy.  She started Vumerity August 2021.  She has low vitamin D and takes supplements weekly.       Imaging:  MRI of the brain 12/05/2018 showed multiple T2/FLAIR hyperintense foci, predominantly in the periventricular white matter radially oriented to the ventricles.  A couple foci were also noted in the juxtacortical white matter, possibly a couple punctate foci in the pons, deep white matter and one focus in the right cerebellar hemisphere.  None of the foci enhanced.  There were no films for comparison.    She reports a cervical spine MRI in 2013 did not show any plaque.       MRI lumbar in 2015 showed mild facet hypertrophy at a few levels.     MRI brain 11/03/2019 showed multiple T2/FLAIR hyperintense foci in the periventricular, juxtacortical and deep white matter of both hemispheres consistent with demyelinating plaque associated with  multiple sclerosis. Most of the foci were present on the 12/05/2018 MRI. However, there is one new juxtacortical focus in the right hemisphere. That focus also enhances after contrast implying a more acute or subacute timeframe.  MRI cervical spine 11/03/2019 showed a normal spinal cord and no significant DJD.  OTHER: She had Lichen Planus on the left arm and has a sore in her mouth potentially the same diagnosis.  She is going to see an oral surgeon for possible biopsy.   REVIEW OF  SYSTEMS: Constitutional: No fevers, chills, sweats, or change in appetite Eyes: No visual changes, double vision, eye pain Ear, nose and throat: No hearing loss, ear pain, nasal congestion, sore throat.  She currently has a sore in her mouth potentially worrisome for lichen planus Cardiovascular: No chest pain, palpitations Respiratory: No shortness of breath at rest or with exertion.   No wheezes GastrointestinaI: No nausea, vomiting, diarrhea, abdominal pain, fecal incontinence Genitourinary: No dysuria, urinary retention or frequency.  No nocturia. Musculoskeletal: No neck pain.  She has some back pain. Integumentary: No rash, pruritus.  She had lichen planus in the left arm Neurological: as above Psychiatric: No depression at this time.  No anxiety Endocrine: No palpitations, diaphoresis, change in appetite, change in weigh or increased thirst Hematologic/Lymphatic: No anemia, purpura, petechiae. Allergic/Immunologic: No itchy/runny eyes, nasal congestion, recent allergic reactions, rashes  ALLERGIES: No Known Allergies  HOME MEDICATIONS:  Current Outpatient Medications:  .  aspirin EC 81 MG tablet, Take 81 mg by mouth daily., Disp: , Rfl:  .  cyclobenzaprine (FLEXERIL) 5 MG tablet, Take 1 tablet (5 mg total) by mouth 3 (three) times daily as needed for muscle spasms., Disp: 60 tablet, Rfl: 1 .  Diclofenac Sodium (PENNSAID) 2 % SOLN, Place 2 g onto the skin 2 (two) times daily., Disp: 112 g, Rfl: 3 .  Diroximel Fumarate (VUMERITY) 231 MG CPDR, Take 231 mg by mouth in the morning and at bedtime., Disp: 120 capsule, Rfl: 11 .  esomeprazole (NEXIUM) 40 MG capsule, TAKE 1 CAPSULE BY MOUTH ONCE DAILY, Disp: 90 capsule, Rfl: 3 .  ondansetron (ZOFRAN-ODT) 4 MG disintegrating tablet, Take 1-2 tablets (4-8 mg total) by mouth every 8 (eight) hours as needed for nausea. Also for migraine, Disp: 30 tablet, Rfl: 3 .  promethazine (PHENERGAN) 25 MG tablet, Take 1 tablet (25 mg total) by mouth  every 6 (six) hours as needed for nausea or vomiting., Disp: 20 tablet, Rfl: 0 .  rizatriptan (MAXALT) 5 MG tablet, Take 1 tablet (5 mg total) by mouth as needed for migraine. May repeat in 2 hours if needed, Disp: 10 tablet, Rfl: 3 .  zolpidem (AMBIEN) 10 MG tablet, 1/2 to 1 pill po qHS, Disp: 30 tablet, Rfl: 5  PAST MEDICAL HISTORY: Past Medical History:  Diagnosis Date  . Asthma   . Lichen planus   . MS (multiple sclerosis) ([place])   . PCOS (polycystic ovarian syndrome)   . Raynaud's disease     PAST SURGICAL HISTORY: Past Surgical History:  Procedure Laterality Date  . colonoscopy    . DILATION AND CURETTAGE OF UTERUS     x 3  . ESOPHAGOGASTRODUODENOSCOPY    . TONSILLECTOMY AND ADENOIDECTOMY      FAMILY HISTORY: Family History  Problem Relation Age of Onset  . Diabetes Mother   . Thyroid disease Mother   . Hypertension Mother   . Hypercholesterolemia Mother   . Migraines Mother   . Depression Mother   . Diabetes Father   .  Thyroid disease Father   . CAD Father   . Hypercholesterolemia Father   . Hypertension Father   . Depression Father   . Pernicious anemia Father   . Breast cancer Paternal Aunt     SOCIAL HISTORY:  Social History   Socioeconomic History  . Marital status: Married    Spouse name: Not on file  . Number of children: 0  . Years of education: college  . Highest education level: Professional school degree (e.g., MD, DDS, DVM, JD)  Occupational History  . Occupation: physician  Tobacco Use  . Smoking status: Never Smoker  . Smokeless tobacco: Never Used  Vaping Use  . Vaping Use: Never used  Substance and Sexual Activity  . Alcohol use: No  . Drug use: No  . Sexual activity: Not on file  Other Topics Concern  . Not on file  Social History Narrative   Right handed   1-2 cups caffeine daily   Lives at home with husband   Social Determinants of Health   Financial Resource Strain:   . Difficulty of Paying Living Expenses: Not on file   Food Insecurity:   . Worried About Charity fundraiser in the Last Year: Not on file  . Ran Out of Food in the Last Year: Not on file  Transportation Needs:   . Lack of Transportation (Medical): Not on file  . Lack of Transportation (Non-Medical): Not on file  Physical Activity:   . Days of Exercise per Week: Not on file  . Minutes of Exercise per Session: Not on file  Stress:   . Feeling of Stress : Not on file  Social Connections:   . Frequency of Communication with Friends and Family: Not on file  . Frequency of Social Gatherings with Friends and Family: Not on file  . Attends Religious Services: Not on file  . Active Member of Clubs or Organizations: Not on file  . Attends Archivist Meetings: Not on file  . Marital Status: Not on file  Intimate Partner Violence:   . Fear of Current or Ex-Partner: Not on file  . Emotionally Abused: Not on file  . Physically Abused: Not on file  . Sexually Abused: Not on file     PHYSICAL EXAM  Vitals:   01/07/20 1400  BP: 123/86  Pulse: 80  Weight: 191 lb 11.2 oz (87 kg)  Height: 5\' 5"  (1.651 m)    Body mass index is 31.9 kg/m.   General: The patient is well-developed and well-nourished and in no acute distress  HEENT:  Head is [place]/AT.  Sclera are anicteric.    Skin: Extremities are without rash or  edema.  Neurologic Exam  Mental status: The patient is alert and oriented x 3 at the time of the examination. The patient has apparent normal recent and remote memory, with an apparently normal attention span and concentration ability.   Speech is normal.  Cranial nerves: Extraocular movements are full.  Color vision was symmetric.  Facial strength is symmetric.  No obvious hearing deficits are noted.  Motor:  Muscle bulk is normal.   Tone is normal. Strength is  5 / 5 in all 4 extremities.   Sensory: Sensory testing is intact to pinprick, soft touch and vibration sensation in all 4 extremities.  Normal temperature  sensation in the trunk  Coordination: Cerebellar testing reveals good finger-nose-finger and heel-to-shin bilaterally.  Gait and station: Station is normal.   Gait is normal. Tandem gait is  normal. Romberg is negative.   Reflexes: Deep tendon reflexes are symmetric and normal bilaterally.        DIAGNOSTIC DATA (LABS, IMAGING, TESTING) - I reviewed patient records, labs, notes, testing and imaging myself where available.  Lab Results  Component Value Date   WBC 10.3 10/12/2019   HGB 11.9 (L) 10/12/2019   HCT 38.5 10/12/2019   MCV 73.6 (L) 10/12/2019   PLT 294 10/12/2019      Component Value Date/Time   NA 138 10/12/2019 0005   K 3.6 10/12/2019 0005   CL 106 10/12/2019 0005   CO2 21 (L) 10/12/2019 0005   GLUCOSE 125 (H) 10/12/2019 0005   BUN 10 10/12/2019 0005   CREATININE 0.84 10/12/2019 0005   CALCIUM 8.6 (L) 10/12/2019 0005   PROT 6.2 (L) 10/12/2019 0005   ALBUMIN 3.6 10/12/2019 0005   AST 21 10/12/2019 0005   ALT 30 10/12/2019 0005   ALKPHOS 41 10/12/2019 0005   BILITOT 0.4 10/12/2019 0005   GFRNONAA >60 10/12/2019 0005   GFRAA >60 10/12/2019 0005       ASSESSMENT AND PLAN  Multiple sclerosis ([place]) - Plan: MR BRAIN W WO CONTRAST  Migraine without aura and with status migrainosus, not intractable  High risk medication use  Dysesthesia - Plan: MR BRAIN W WO CONTRAST   1.  Continue Vumerity.  Around February we will check an MRI of the brain to determine if there is any subclinical progression 2.  If the migraine occurs she can take rizatriptan.  I did give her 1 sample of Ubrelvy to try if she does not get a benefit from the rizatriptan. 3.   Renew Ambien as needed. 4.   Return to see me in 4-5 months or sooner for new or worsening neurologic symptoms.  45-minute office visit with the majority of the time spent face-to-face for history and physical, discussion/counseling and decision-making.  Additional time with record review and  documentation.   Imad Shostak A. Felecia Shelling, MD, [facility] 78/04/9560, 1:30 PM Certified in Neurology, Clinical Neurophysiology, Sleep Medicine and Neuroimaging  [facility] [address], [place], [place] [address] [phone]

## 2020-01-08 DIAGNOSIS — K582 Mixed irritable bowel syndrome: Secondary | ICD-10-CM | POA: Diagnosis not present

## 2020-01-08 DIAGNOSIS — K219 Gastro-esophageal reflux disease without esophagitis: Secondary | ICD-10-CM | POA: Diagnosis not present

## 2020-02-15 MED FILL — ZOLPIDEM TARTRATE 10 MG TAB: 10 | 30 days supply | Qty: 30 | Fill #0

## 2020-02-15 MED FILL — ONDANSETRON ODT 4 MG TABLET: 4 | 5 days supply | Qty: 30 | Fill #2

## 2020-02-17 ENCOUNTER — Other Ambulatory Visit (HOSPITAL_COMMUNITY): Payer: Self-pay | Admitting: Gastroenterology

## 2020-02-17 DIAGNOSIS — N979 Female infertility, unspecified: Secondary | ICD-10-CM | POA: Diagnosis not present

## 2020-02-17 DIAGNOSIS — G4700 Insomnia, unspecified: Secondary | ICD-10-CM | POA: Diagnosis not present

## 2020-02-17 DIAGNOSIS — G35 Multiple sclerosis: Secondary | ICD-10-CM | POA: Diagnosis not present

## 2020-02-17 DIAGNOSIS — E669 Obesity, unspecified: Secondary | ICD-10-CM | POA: Diagnosis not present

## 2020-02-18 ENCOUNTER — Encounter: Payer: Self-pay | Admitting: Family Medicine

## 2020-02-18 ENCOUNTER — Other Ambulatory Visit: Payer: Self-pay

## 2020-02-18 ENCOUNTER — Ambulatory Visit (INDEPENDENT_AMBULATORY_CARE_PROVIDER_SITE_OTHER): Payer: 59 | Admitting: Family Medicine

## 2020-02-18 VITALS — BP 122/82 | HR 81 | Ht 65.0 in | Wt 186.0 lb

## 2020-02-18 DIAGNOSIS — M999 Biomechanical lesion, unspecified: Secondary | ICD-10-CM

## 2020-02-18 DIAGNOSIS — G8929 Other chronic pain: Secondary | ICD-10-CM | POA: Diagnosis not present

## 2020-02-18 DIAGNOSIS — M546 Pain in thoracic spine: Secondary | ICD-10-CM | POA: Diagnosis not present

## 2020-02-18 NOTE — Patient Instructions (Signed)
You are fantastic! See me again in 6-8 weeks

## 2020-02-18 NOTE — Progress Notes (Signed)
[facility] [place] Phone: [phone] Subjective:   Martha Martha, am serving as a scribe for Dr. Hulan Saas. This visit occurred during the SARS-CoV-2 public health emergency.  Safety protocols were in place, including screening questions prior to the visit, additional usage of staff PPE, and extensive cleaning of exam room while observing appropriate contact time as indicated for disinfecting solutions.   I'm seeing this patient by the request  of:  Patient, Martha Martha  CC: Back pain follow-up  TOI:[id number]  Martha Martha is a 39 y.o. female coming in with complaint of back and neck pain. OMT 01/06/2020. Patient states that her back has been tight but Martha issues since last visit.  Patient has been very active.  Medications patient has been prescribed: Flexeril  Taking: Very seldomly         Reviewed prior external information including notes and imaging from previsou exam, outside providers and external EMR if available.   As well as notes that were available from care everywhere and other healthcare systems.  Past medical history, social, surgical and family history all reviewed in electronic medical record.  Martha pertanent information unless stated regarding to the chief complaint.   Past Medical History:  Diagnosis Date  . Asthma   . Lichen planus   . MS (multiple sclerosis) ([place])   . PCOS (polycystic ovarian syndrome)   . Raynaud's disease     Martha Known Allergies   Review of Systems:  Martha headache, visual changes, nausea, vomiting, diarrhea, constipation, dizziness, abdominal pain, skin rash, fevers, chills, night sweats, weight loss, swollen lymph nodes,  joint swelling, chest pain, shortness of breath, mood changes. POSITIVE muscle aches, mild body aches  Objective  Blood pressure 122/82, pulse 81, height 5\' 5"  (1.651 m), weight 186 lb (84.4 kg), SpO2 97 %.   General: Martha apparent distress alert  and oriented x3 mood and affect normal, dressed appropriately.  HEENT: Pupils equal, extraocular movements intact  Respiratory: Patient's speak in full sentences and does not appear short of breath  Cardiovascular: Martha lower extremity edema, non tender, Martha erythema  Neuro: Cranial nerves II through XII are intact, neurovascularly intact in all extremities with 2+ DTRs and 2+ pulses.  Gait normal with good balance and coordination.  MSK:  Non tender with full range of motion and good stability and symmetric strength and tone of shoulders, elbows, wrist, hip, knee and ankles bilaterally.  Back -patient has tightness more in the thoracolumbar junction.  Patient has more tightness in the mid back and does have some tightness noted in the parascapular region.  Osteopathic findings T3 extended rotated and side bent right inhaled rib T8 extended rotated and side bent left L1 flexed rotated and side bent right Sacrum right on right       Assessment and Plan: Thoracic back pain Patient is doing significantly better at this time.  Discussed posture and ergonomics.  Continue to work on discussed previously.  Has the Flexeril as needed.  Follow-up again in 6 to 8 weeks    Nonallopathic problems  Decision today to treat with OMT was based on Physical Exam  After verbal consent patient was treated with HVLA, ME, FPR techniques in  rib, thoracic, lumbar, and sacral  areas  Patient tolerated the procedure well with improvement in symptoms  Patient given exercises, stretches and lifestyle modifications  See medications in patient instructions if given  Patient will follow up in 4-8  weeks      The above documentation has been reviewed and is accurate and complete Martha Pulley, DO       Note: This dictation was prepared with Dragon dictation along with smaller phrase technology. Any transcriptional errors that result from this process are unintentional.

## 2020-02-18 NOTE — Assessment & Plan Note (Signed)
Patient is doing significantly better at this time.  Discussed posture and ergonomics.  Continue to work on discussed previously.  Has the Flexeril as needed.  Follow-up again in 6 to 8 weeks

## 2020-02-23 ENCOUNTER — Other Ambulatory Visit (HOSPITAL_COMMUNITY): Payer: Self-pay | Admitting: Medical

## 2020-02-23 DIAGNOSIS — L304 Erythema intertrigo: Secondary | ICD-10-CM | POA: Diagnosis not present

## 2020-02-23 DIAGNOSIS — D1801 Hemangioma of skin and subcutaneous tissue: Secondary | ICD-10-CM | POA: Diagnosis not present

## 2020-02-23 DIAGNOSIS — L439 Lichen planus, unspecified: Secondary | ICD-10-CM | POA: Diagnosis not present

## 2020-02-23 DIAGNOSIS — L219 Seborrheic dermatitis, unspecified: Secondary | ICD-10-CM | POA: Diagnosis not present

## 2020-02-23 DIAGNOSIS — L814 Other melanin hyperpigmentation: Secondary | ICD-10-CM | POA: Diagnosis not present

## 2020-02-23 DIAGNOSIS — D2272 Melanocytic nevi of left lower limb, including hip: Secondary | ICD-10-CM | POA: Diagnosis not present

## 2020-02-23 DIAGNOSIS — D224 Melanocytic nevi of scalp and neck: Secondary | ICD-10-CM | POA: Diagnosis not present

## 2020-02-23 MED FILL — WEGOVY 1 MG/0.5ML SOAJ: 1 | 28 days supply | Qty: 2 | Fill #0

## 2020-02-23 MED FILL — KETOCONAZOLE 2 % SHAM: 2 | 30 days supply | Qty: 120 | Fill #0

## 2020-02-29 MED FILL — WEGOVY 1.7 MG/0.75ML SOAJ: 1.7 | 28 days supply | Qty: 3 | Fill #0

## 2020-03-15 ENCOUNTER — Encounter: Payer: Self-pay | Admitting: *Deleted

## 2020-03-15 ENCOUNTER — Ambulatory Visit (INDEPENDENT_AMBULATORY_CARE_PROVIDER_SITE_OTHER): Payer: 59 | Admitting: Cardiology

## 2020-03-15 ENCOUNTER — Other Ambulatory Visit: Payer: Self-pay

## 2020-03-15 ENCOUNTER — Encounter: Payer: Self-pay | Admitting: Cardiology

## 2020-03-15 ENCOUNTER — Ambulatory Visit (INDEPENDENT_AMBULATORY_CARE_PROVIDER_SITE_OTHER): Payer: 59

## 2020-03-15 VITALS — BP 116/82 | HR 89 | Ht 65.0 in | Wt 184.2 lb

## 2020-03-15 DIAGNOSIS — R002 Palpitations: Secondary | ICD-10-CM

## 2020-03-15 NOTE — Progress Notes (Signed)
Electrophysiology Office Note   Date:  03/15/2020   ID:  Martha [name], DOB 15-Jun-1980, MRN [id number]  PCP:  Patient, No Pcp Per  Cardiologist:  [place] Primary Electrophysiologist:  Carmeron Heady Jorja Loa, MD    Chief Complaint: palpitations   History of Present Illness: Martha [name] is a 40 y.o. female who is being seen today for the evaluation of palpitations at the request of No ref. provider found. Presenting today for electrophysiology evaluation.  She has a history significant for relapsing and remitting multiple sclerosis, PCOS, GERD, Raynaud's syndrome.  She was previously followed by cardiology due to an episode of chest discomfort.  She felt numbness in her left arm and presented to the emergency room.  In the emergency room she was tachycardic with ECG showing sinus tachycardia.  Today, she denies symptoms of chest pain, shortness of breath, orthopnea, PND, lower extremity edema, claudication, dizziness, presyncope, syncope, bleeding, or neurologic sequela. The patient is tolerating medications without difficulties.  She has palpitations multiple times a week.  At times, she has skipped beats, and other times she has sustained palpitations.  She has checked her heart rate which is in the 140s to 160s at times.  This occurs when she is not very active.  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  She feels that they are potentially vagal episodes.   Past Medical History:  Diagnosis Date  . Asthma   . Lichen planus   . MS (multiple sclerosis) ([facility])   . PCOS (polycystic ovarian syndrome)   . Raynaud's disease    Past Surgical History:  Procedure Laterality Date  . colonoscopy    . DILATION AND CURETTAGE OF UTERUS     x 3  . ESOPHAGOGASTRODUODENOSCOPY    . TONSILLECTOMY AND ADENOIDECTOMY       Current Outpatient Medications  Medication Sig Dispense Refill  . Ascorbic Acid (VITAMIN C) 1000 MG tablet Take 1  tablet by mouth as needed.    Marland Kitchen aspirin EC 81 MG tablet Take 81 mg by mouth daily.    . Cholecalciferol (VITAMIN D) 125 MCG (5000 UT) CAPS Take 1 capsule by mouth once a week.    . cyclobenzaprine (FLEXERIL) 5 MG tablet Take 1 tablet (5 mg total) by mouth 3 (three) times daily as needed for muscle spasms. 60 tablet 1  . Diclofenac Sodium (PENNSAID) 2 % SOLN Place 2 g onto the skin 2 (two) times daily. 112 g 3  . Diroximel Fumarate (VUMERITY) 231 MG CPDR Take 231 mg by mouth in the morning and at bedtime. 120 capsule 11  . esomeprazole (NEXIUM) 40 MG capsule TAKE 1 CAPSULE BY MOUTH ONCE DAILY 90 capsule 3  . Multiple Vitamin (MULTIVITAMIN ADULT PO) Take 1 tablet by mouth daily in the afternoon.    . promethazine (PHENERGAN) 25 MG tablet Take 1 tablet (25 mg total) by mouth every 6 (six) hours as needed for nausea or vomiting. 20 tablet 0  . rizatriptan (MAXALT) 5 MG tablet Take 1 tablet (5 mg total) by mouth as needed for migraine. May repeat in 2 hours if needed 10 tablet 3  . Zinc 50 MG TABS Take 1 tablet by mouth as needed.    . zolpidem (AMBIEN) 10 MG tablet 1/2 to 1 pill po qHS 30 tablet 5   No current facility-administered medications for this visit.    Allergies:   Patient has no known allergies.   Social History:  The patient  reports that she has never smoked. She has never used smokeless tobacco. She reports that she does not drink alcohol and does not use drugs.   Family History:  The patient's family history includes Breast cancer in her paternal aunt; CAD in her father; Depression in her father and mother; Diabetes in her father and mother; Hypercholesterolemia in her father and mother; Hypertension in her father and mother; Migraines in her mother; Pernicious anemia in her father; Thyroid disease in her father and mother.    ROS:  Please see the history of present illness.   Otherwise, review of systems is positive for none.   All other systems are reviewed and negative.     PHYSICAL EXAM: VS:  BP 116/82   Pulse 89   Ht 5\' 5"  (1.651 m)   Wt 184 lb 3.2 oz (83.6 kg)   SpO2 98%   BMI 30.65 kg/m  , BMI Body mass index is 30.65 kg/m. GEN: Well nourished, well developed, in no acute distress  HEENT: normal  Neck: no JVD, carotid bruits, or masses Cardiac: RRR; no murmurs, rubs, or gallops,no edema  Respiratory:  clear to auscultation bilaterally, normal work of breathing GI: soft, nontender, nondistended, + BS MS: no deformity or atrophy  Skin: warm and dry Neuro:  Strength and sensation are intact Psych: euthymic mood, full affect  EKG:  EKG is ordered today. Personal review of the ekg ordered shows sinus rhythm, rate 89  Recent Labs: 10/12/2019: ALT 30; BUN 10; Creatinine, Ser 0.84; Hemoglobin 11.9; Platelets 294; Potassium 3.6; Sodium 138    Lipid Panel  No results found for: CHOL, TRIG, HDL, CHOLHDL, VLDL, LDLCALC, LDLDIRECT   Wt Readings from Last 3 Encounters:  03/15/20 184 lb 3.2 oz (83.6 kg)  02/18/20 186 lb (84.4 kg)  01/07/20 191 lb 11.2 oz (87 kg)      Other studies Reviewed: Additional studies/ records that were reviewed today include: TTE 10/28/19  Review of the above records today demonstrates:  1. Left ventricular ejection fraction, by estimation, is 60 to 65%. The  left ventricle has normal function. The left ventricle has no regional  wall motion abnormalities. Left ventricular diastolic parameters were  normal.  2. Right ventricular systolic function is normal. The right ventricular  size is normal.  3. The mitral valve is normal in structure. Trivial mitral valve  regurgitation. No evidence of mitral stenosis.  4. The aortic valve is normal in structure. Aortic valve regurgitation is  not visualized. No aortic stenosis is present.  5. The inferior vena cava is normal in size with greater than 50%  respiratory variability, suggesting right atrial pressure of 3 mmHg.    ASSESSMENT AND PLAN:  1.  Palpitations:  Has palpitations on a daily basis.  She has occasional skipped beats, but is also had rapid heart rates.  There are no exacerbating or alleviating factors.  She has tried vagal maneuvers without change.  She has had heart rates up into the 160s at times.  These have occurred without much activity.  She also has intermittent near syncopal episodes that sound to be vagal in nature.  To further determine her arrhythmia, we Atreus Hasz have her wear a 2-week monitor.    Current medicines are reviewed at length with the patient today.   The patient does not have concerns regarding her medicines.  The following changes were made today:  none  Labs/ tests ordered today include:  Orders Placed This Encounter  Procedures  .  LONG TERM MONITOR (3-14 DAYS)  . EKG 12-Lead     Disposition:   FU with Kerith Sherley pending monitor results months  Signed, Betul Brisky Meredith Leeds, MD  03/15/2020 4:04 PM     [address] [place] [address] [phone] (office) [phone] (fax)

## 2020-03-15 NOTE — Patient Instructions (Signed)
Medication Instructions:  Your physician recommends that you continue on your current medications as directed. Please refer to the Current Medication list given to you today.  *If you need a refill on your cardiac medications before your next appointment, please call your pharmacy*   Lab Work: None ordered   Testing/Procedures: None ordered   Follow-Up: At [facility], you and your health needs are our priority.  As part of our continuing mission to provide you with exceptional heart care, we have created designated Provider Care Teams.  These Care Teams include your primary Cardiologist (physician) and Advanced Practice Providers (APPs -  Physician Assistants and Nurse Practitioners) who all work together to provide you with the care you need, when you need it.  Your next appointment:    to be determined  The format for your next appointment:   In Person  Provider:   Allegra Lai, MD    Thank you for choosing [facility]!!   Trinidad Curet, RN [phone]   Other Instructions

## 2020-03-15 NOTE — Progress Notes (Signed)
Patient ID: Martha [name], female   DOB: 1980-10-22, 40 y.o.   MRN: [id number] Patient enrolled for [employer] to ship a 14 day ZIO XT long term holter monitor to her home.

## 2020-03-20 DIAGNOSIS — R002 Palpitations: Secondary | ICD-10-CM

## 2020-03-25 MED FILL — WEGOVY 2.4 MG/0.75ML SOAJ: 2.4 | 28 days supply | Qty: 3 | Fill #0

## 2020-03-30 NOTE — Progress Notes (Unsigned)
Martha [name] [address] [place] Phone: [phone] Subjective:   I Martha [name] am serving as a Education administrator for Dr. Hulan [name].  This visit occurred during the SARS-CoV-2 public health emergency.  Safety protocols were in place, including screening questions prior to the visit, additional usage of staff PPE, and extensive cleaning of exam room while observing appropriate contact time as indicated for disinfecting solutions.   I'm seeing this patient by the request  of:  Patient, No Pcp Per  CC: Back and neck pain follow-up  Martha [name]:[id number]  Martha [name] is a 40 y.o. female coming in with complaint of back and neck pain. OMT 02/18/2020. Patient states she has been doing well for the most part.  Patient is still being worked on for tachycardia and palpitations.  Patient still just not feeling like herself overall.  Patient states that pain wise though not significant amount.  Does not feel significantly stressed at this time.  Has noticed some more hair loss recently.  Attempting to lose weight and is doing relatively well with that she states.  Medications patient has been prescribed: None  Taking:         Reviewed prior external information including notes and imaging from previsou exam, outside providers and external EMR if available.   As well as notes that were available from care everywhere and other healthcare systems.  Past medical history, social, surgical and family history all reviewed in electronic medical record.  No pertanent information unless stated regarding to the chief complaint.   Past Medical History:  Diagnosis Date  . Asthma   . Lichen planus   . MS (multiple sclerosis) ([place])   . PCOS (polycystic ovarian syndrome)   . Raynaud's disease     No Known Allergies   Review of Systems:  No headache, visual changes, nausea, vomiting, diarrhea, constipation, abdominal pain, skin rash, fevers, chills, night  sweats, swollen lymph nodes, body aches, joint swelling, chest pain, shortness of breath, mood changes. POSITIVE muscle aches, intermittent dizziness, hair loss, weight loss but intentional  Objective  Blood pressure 130/80, pulse 98, height 5\' 5"  (1.651 m), weight 179 lb (81.2 kg), SpO2 99 %.   General: No apparent distress alert and oriented x3 mood and affect normal, dressed appropriately.  HEENT: Pupils equal, extraocular movements intact  Respiratory: Patient's speak in full sentences and does not appear short of breath  Cardiovascular: No lower extremity edema, non tender, no erythema  Neuro: Cranial nerves II through XII are intact, neurovascularly intact in all extremities with 2+ DTRs and 2+ pulses.  Gait normal with good balance and coordination.  MSK:  Non tender with full range of motion and good stability and symmetric strength and tone of shoulders, elbows, wrist, hip, knee and ankles bilaterally.  Back - Normal skin, Spine with normal alignment and no deformity.  No tenderness to vertebral process palpation.  Paraspinous muscles are not tender and without spasm.   Range of motion is full at neck and lumbar sacral regions  Osteopathic findings   T3 extended rotated and side bent right inhaled rib T8 extended rotated and side bent left L2 flexed rotated and side bent right Sacrum right on right       Assessment and Plan:   Thoracic back pain Chronic problem, posture, ergonomics discussed Patient unfortunately doing the soft tissue in the patient's neck had some very mild dizziness.  We discussed the possibility of laboratory work-up which patient declined.  We  also discussed the possibility of MRIs with patient's multiple sclerosis as well as an MR angiogram.  Patient felt fine after just 5 seconds.  Patient feels like it is just what ever else is going on at this moment.  Patient is seeing other doctors and will continue with them.  Will see me again in 6 weeks     Nonallopathic problems  Decision today to treat with OMT was based on Physical Exam  After verbal consent patient was treated with HVLA, ME, FPR techniques in  thoracic, lumbar, and sacral  Areas patient did become a little symptomatic with the next and he discontinued.  Once again discussed with patient about further work-up which patient declined with her feeling better almost immediately.  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  Would also consider the possibility of an MR angiogram but patient does not have any symptoms at this time.  Patient tolerated the procedure well with improvement in symptoms  Patient given exercises, stretches and lifestyle modifications  See medications in patient instructions if given  Patient will follow up in 6 weeks     The above documentation has been reviewed and is accurate and complete Martha Pulley, DO       Note: This dictation was prepared with Dragon dictation along with smaller phrase technology. Any transcriptional errors that result from this process are unintentional.

## 2020-03-31 ENCOUNTER — Other Ambulatory Visit: Payer: Self-pay

## 2020-03-31 ENCOUNTER — Ambulatory Visit (INDEPENDENT_AMBULATORY_CARE_PROVIDER_SITE_OTHER): Payer: 59 | Admitting: Family Medicine

## 2020-03-31 ENCOUNTER — Encounter: Payer: Self-pay | Admitting: Family Medicine

## 2020-03-31 DIAGNOSIS — M546 Pain in thoracic spine: Secondary | ICD-10-CM

## 2020-03-31 DIAGNOSIS — M999 Biomechanical lesion, unspecified: Secondary | ICD-10-CM | POA: Diagnosis not present

## 2020-03-31 DIAGNOSIS — G8929 Other chronic pain: Secondary | ICD-10-CM

## 2020-03-31 NOTE — Patient Instructions (Addendum)
Good to see you Keep working on posture Keep me updated if I can help See me again in 6 weeks

## 2020-03-31 NOTE — Assessment & Plan Note (Signed)
Chronic problem, posture, ergonomics discussed Patient unfortunately doing the soft tissue in the patient's neck had some very mild dizziness.  We discussed the possibility of laboratory work-up which patient declined.  We also discussed the possibility of MRIs with patient's multiple sclerosis as well as an MR angiogram.  Patient felt fine after just 5 seconds.  Patient feels like it is just what ever else is going on at this moment.  Patient is seeing other doctors and will continue with them.  Will see me again in 6 weeks

## 2020-04-01 DIAGNOSIS — Z139 Encounter for screening, unspecified: Secondary | ICD-10-CM | POA: Diagnosis not present

## 2020-04-01 DIAGNOSIS — L659 Nonscarring hair loss, unspecified: Secondary | ICD-10-CM | POA: Diagnosis not present

## 2020-04-04 ENCOUNTER — Telehealth: Payer: Self-pay | Admitting: Neurology

## 2020-04-04 NOTE — Telephone Encounter (Signed)
MR Brain w/wo contrast Dr. Arlyss Queen Josem Kaufmann: [place] spoke to Santiago Glad ref # [id number]. Patient is scheduled at [facility] for 04/13/20.

## 2020-04-11 ENCOUNTER — Other Ambulatory Visit: Payer: Self-pay | Admitting: Cardiology

## 2020-04-11 DIAGNOSIS — R002 Palpitations: Secondary | ICD-10-CM | POA: Diagnosis not present

## 2020-04-11 MED ORDER — DILTIAZEM HCL 30 MG PO TABS
30.0000 mg | ORAL_TABLET | Freq: Four times a day (QID) | ORAL | 2 refills | Status: DC | PRN
Start: 2020-04-11 — End: 2020-04-11

## 2020-04-11 MED FILL — dilTIAZem HCL 30 MG TABS: 30 | 7 days supply | Qty: 30 | Fill #0

## 2020-04-11 NOTE — Telephone Encounter (Signed)
-----   Message from Will Meredith Leeds, MD sent at 04/11/2020  2:41 PM EST ----- Palpitations with sinus tachycardia.  Start diltiazem 30 mg as needed.  Needs a 50-month follow-up.

## 2020-04-13 ENCOUNTER — Other Ambulatory Visit: Payer: Self-pay | Admitting: Neurology

## 2020-04-13 ENCOUNTER — Ambulatory Visit: Payer: 59

## 2020-04-13 DIAGNOSIS — R208 Other disturbances of skin sensation: Secondary | ICD-10-CM

## 2020-04-13 DIAGNOSIS — G35 Multiple sclerosis: Secondary | ICD-10-CM

## 2020-04-18 DIAGNOSIS — N6310 Unspecified lump in the right breast, unspecified quadrant: Secondary | ICD-10-CM | POA: Diagnosis not present

## 2020-04-26 MED FILL — WEGOVY 2.4 MG/0.75ML SOAJ: 2.4 | 28 days supply | Qty: 3 | Fill #1

## 2020-04-28 DIAGNOSIS — N6310 Unspecified lump in the right breast, unspecified quadrant: Secondary | ICD-10-CM | POA: Diagnosis not present

## 2020-05-10 NOTE — Progress Notes (Signed)
[place] [address] [place] Phone: [phone] Subjective:   I [name] am serving as a Education administrator for Dr. Hulan Saas.  This visit occurred during the SARS-CoV-2 public health emergency.  Safety protocols were in place, including screening questions prior to the visit, additional usage of staff PPE, and extensive cleaning of exam room while observing appropriate contact time as indicated for disinfecting solutions.   I'm seeing this patient by the request  of:  Patient, No Pcp Per  CC: Neck and back pain follow-up  RCV:[id number]  Nil Bolser is a 39 y.o. female coming in with complaint of back and neck pain. OMT 03/31/2020. Patient states she is feeling ok. Fell down the stairs 2 weeks ago.  Patient states that she did have bruising but seems to be improving.  Did not cause more tightness though of the upper back and some of the neck.          Reviewed prior external information including notes and imaging from previsou exam, outside providers and external EMR if available.   As well as notes that were available from care everywhere and other healthcare systems.  Past medical history, social, surgical and family history all reviewed in electronic medical record.  No pertanent information unless stated regarding to the chief complaint.   Past Medical History:  Diagnosis Date  . Asthma   . Lichen planus   . MS (multiple sclerosis) ([place])   . PCOS (polycystic ovarian syndrome)   . Raynaud's disease     No Known Allergies   Review of Systems:  No headache, visual changes, nausea, vomiting, diarrhea, constipation, dizziness, abdominal pain, skin rash, fevers, chills, night sweats, weight loss, swollen lymph nodes, body aches, joint swelling, chest pain, shortness of breath, mood changes. POSITIVE muscle aches  Objective  Blood pressure 120/80, height 5\' 5"  (1.651 m), weight 168 lb (76.2 kg).   General: No apparent  distress alert and oriented x3 mood and affect normal, dressed appropriately.  HEENT: Pupils equal, extraocular movements intact  Respiratory: Patient's speak in full sentences and does not appear short of breath  Cardiovascular: No lower extremity edema, non tender, no erythema   Gait normal with good balance and coordination.  MSK:  Non tender with full range of motion and good stability and symmetric strength and tone of shoulders, elbows, wrist, hip, knee and ankles bilaterally.  Back -patient's mid back still has some tightness noted.  Tenderness to palpation in the parascapular region.  Osteopathic findings   T3 extended rotated and side bent right inhaled rib T7 extended rotated and side bent left L2 flexed rotated and side bent right Sacrum right on right       Assessment and Plan:  Thoracic back pain Stable overall but some mild increase in tightness.  Will refill patient's Flexeril at this time.  Discussed with patient and daughter continuing to watch ergonomics.  Patient has had some mild increase in stress with work that could be contributing as well.  Follow-up with me again in 6 weeks    Nonallopathic problems  Decision today to treat with OMT was based on Physical Exam  After verbal consent patient was treated with HVLA, ME, FPR techniques in rib, thoracic, lumbar, and sacral  areas we did do some mild muscle energy of the cervical spine today.  Patient tolerated the procedure well with improvement in symptoms  Patient given exercises, stretches and lifestyle modifications  See medications in patient instructions  if given  Patient will follow up in 4-8 weeks      The above documentation has been reviewed and is accurate and complete Lyndal Pulley, DO       Note: This dictation was prepared with Dragon dictation along with smaller phrase technology. Any transcriptional errors that result from this process are unintentional.

## 2020-05-11 ENCOUNTER — Ambulatory Visit (INDEPENDENT_AMBULATORY_CARE_PROVIDER_SITE_OTHER): Payer: 59 | Admitting: Neurology

## 2020-05-11 ENCOUNTER — Encounter: Payer: Self-pay | Admitting: Family Medicine

## 2020-05-11 ENCOUNTER — Other Ambulatory Visit: Payer: Self-pay

## 2020-05-11 ENCOUNTER — Encounter: Payer: Self-pay | Admitting: Neurology

## 2020-05-11 ENCOUNTER — Ambulatory Visit (INDEPENDENT_AMBULATORY_CARE_PROVIDER_SITE_OTHER): Payer: 59 | Admitting: Family Medicine

## 2020-05-11 VITALS — BP 120/80 | Ht 65.0 in | Wt 168.0 lb

## 2020-05-11 VITALS — BP 113/76 | HR 89 | Ht 65.0 in | Wt 168.0 lb

## 2020-05-11 DIAGNOSIS — L439 Lichen planus, unspecified: Secondary | ICD-10-CM

## 2020-05-11 DIAGNOSIS — R208 Other disturbances of skin sensation: Secondary | ICD-10-CM | POA: Diagnosis not present

## 2020-05-11 DIAGNOSIS — G43001 Migraine without aura, not intractable, with status migrainosus: Secondary | ICD-10-CM

## 2020-05-11 DIAGNOSIS — Z79899 Other long term (current) drug therapy: Secondary | ICD-10-CM | POA: Diagnosis not present

## 2020-05-11 DIAGNOSIS — G8929 Other chronic pain: Secondary | ICD-10-CM | POA: Diagnosis not present

## 2020-05-11 DIAGNOSIS — M999 Biomechanical lesion, unspecified: Secondary | ICD-10-CM | POA: Diagnosis not present

## 2020-05-11 DIAGNOSIS — M546 Pain in thoracic spine: Secondary | ICD-10-CM | POA: Diagnosis not present

## 2020-05-11 DIAGNOSIS — G35 Multiple sclerosis: Secondary | ICD-10-CM

## 2020-05-11 MED ORDER — CYCLOBENZAPRINE HCL 5 MG PO TABS: 5.0000 mg | ORAL_TABLET | Freq: Three times a day (TID) | ORAL | 1 refills | Status: DC | PRN

## 2020-05-11 MED FILL — RIZATRIPTAN 5 MG TABLET: 5 | 30 days supply | Qty: 10 | Fill #1

## 2020-05-11 MED FILL — ONDANSETRON ODT 4 MG TABLET: 4 | 5 days supply | Qty: 30 | Fill #3

## 2020-05-11 MED FILL — CYCLOBENZAPRINE HCL 5 MG TA: 5 | 20 days supply | Qty: 60 | Fill #0

## 2020-05-11 MED FILL — ZOLPIDEM TARTRATE 10 MG TAB: 10 | 30 days supply | Qty: 30 | Fill #1

## 2020-05-11 NOTE — Assessment & Plan Note (Signed)
Stable overall but some mild increase in tightness.  Will refill patient's Flexeril at this time.  Discussed with patient and daughter continuing to watch ergonomics.  Patient has had some mild increase in stress with work that could be contributing as well.  Follow-up with me again in 6 weeks

## 2020-05-11 NOTE — Progress Notes (Signed)
[facility]   PATIENT: Martha [name] DOB: 1980/10/18  REFERRING DOCTOR OR PCP:  [name] [facility]) SOURCE: Patient, notes from neurology, laboratory reports, imaging reports, MRI images personally reviewed.  _________________________________   HISTORICAL  CHIEF COMPLAINT:  Chief Complaint  Patient presents with  . Follow-up    RM 12, alone. Last seen 01/07/2020. On Vumerity for MS, tolerating well. States she does not like how big the pills are. Takes rizatriptan prn for migraines (has to take at night d/t making her sleepy). Has had two migraines in the last two months. Denies any new sx.     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 woman with multiple sclerosis.  Update 05/11/2020:  She is on Vumerity and tolerates it well.   She is now tolerating it well though swallowing pills is difficulty.    had mild flushing but no GI upset.   No recent flushing event.     She gets some squeezing dysesthesias in left arm and calves at times.   Otherwise, normal sensation.    She holds the bannister on stairs and had one fall on stairs but is doing well     Currently, she is noting some fatigue and occasional brain fog but denies  weakness or clumsiness.   Vision is fine.  Bladder is fine.   She had ON near the time of diagnosis in 2009.    She is having migraines and had two in a row 2 weeks ago.    Maxalt helped and is tolerated ok but makes her sleepy.   Naratriptan caused nausea and did not reduce the migraine..        MS History She was diagnosed with MS in 2009 after presenting with right optic neuritis.  She initially saw ophthalmology and was referred to neuro-ophthalmology (Dr. [name]).  After an MRI showed changes consistent with MS, she was referred to Dr. [name], at [facility] neurology.     She was placed on Avonex and tolerated it well.   Due to LFT elevation, she switched to Copaxone.    She had tolerability issues and  switched to Tecfidera in 2013.  She had some mild liver elevation.  Her MS was stable on all of these medications.  She stopped Tecfidera in 2017 due to family planning issues.    She has not been able to get pregnant and is ready to restart therapy.  She started Vumerity August 2021.  She has low vitamin D and takes supplements weekly.       Imaging:  MRI of the brain 12/05/2018 showed multiple T2/FLAIR hyperintense foci, predominantly in the periventricular white matter radially oriented to the ventricles.  A couple foci were also noted in the juxtacortical white matter, possibly a couple punctate foci in the pons, deep white matter and one focus in the right cerebellar hemisphere.  None of the foci enhanced.  There were no films for comparison.    She reports a cervical spine MRI in 2013 did not show any plaque.      MRI lumbar in 2015 showed mild facet hypertrophy at a few levels.     MRI brain 11/03/2019 showed multiple T2/FLAIR hyperintense foci in the periventricular, juxtacortical and deep white matter of both hemispheres consistent with demyelinating plaque associated with multiple sclerosis. Most of the foci were present on the 12/05/2018 MRI. However, there is one new juxtacortical focus in the right hemisphere. That focus also enhances after contrast  implying a more acute or subacute timeframe.  MRI cervical spine 11/03/2019 showed a normal spinal cord and no significant DJD.  OTHER: She had Lichen Planus on the left arm and has a sore in her mouth potentially the same diagnosis.  She is going to see an oral surgeon for possible biopsy.   REVIEW OF SYSTEMS: Constitutional: No fevers, chills, sweats, or change in appetite Eyes: No visual changes, double vision, eye pain Ear, nose and throat: No hearing loss, ear pain, nasal congestion, sore throat.  She currently has a sore in her mouth potentially worrisome for lichen planus Cardiovascular: No chest pain, palpitations Respiratory: No  shortness of breath at rest or with exertion.   No wheezes GastrointestinaI: No nausea, vomiting, diarrhea, abdominal pain, fecal incontinence Genitourinary: No dysuria, urinary retention or frequency.  No nocturia. Musculoskeletal: No neck pain.  She has some back pain. Integumentary: No rash, pruritus.  She had lichen planus in the left arm Neurological: as above Psychiatric: No depression at this time.  No anxiety Endocrine: No palpitations, diaphoresis, change in appetite, change in weigh or increased thirst Hematologic/Lymphatic: No anemia, purpura, petechiae. Allergic/Immunologic: No itchy/runny eyes, nasal congestion, recent allergic reactions, rashes  ALLERGIES: No Known Allergies  HOME MEDICATIONS:  Current Outpatient Medications:  .  Ascorbic Acid (VITAMIN C) 1000 MG tablet, Take 1 tablet by mouth as needed., Disp: , Rfl:  .  aspirin EC 81 MG tablet, Take 81 mg by mouth daily., Disp: , Rfl:  .  CALCIUM PO, Take 500 mg by mouth daily. +Vit D3 (66mcg), Disp: , Rfl:  .  Cholecalciferol (VITAMIN D) 125 MCG (5000 UT) CAPS, Take 1 capsule by mouth once a week., Disp: , Rfl:  .  cyclobenzaprine (FLEXERIL) 5 MG tablet, Take 1 tablet (5 mg total) by mouth 3 (three) times daily as needed for muscle spasms., Disp: 60 tablet, Rfl: 1 .  Diclofenac Sodium (PENNSAID) 2 % SOLN, Place 2 g onto the skin 2 (two) times daily., Disp: 112 g, Rfl: 3 .  diltiazem (CARDIZEM) 30 MG tablet, Take 1 tablet (30 mg total) by mouth 4 (four) times daily as needed (for palpitations/elevated heart rates)., Disp: 30 tablet, Rfl: 2 .  Diroximel Fumarate (VUMERITY) 231 MG CPDR, Take 231 mg by mouth in the morning and at bedtime., Disp: 120 capsule, Rfl: 11 .  esomeprazole (NEXIUM) 40 MG capsule, TAKE 1 CAPSULE BY MOUTH ONCE DAILY, Disp: 90 capsule, Rfl: 3 .  MAGNESIUM PO, Take 200 mg by mouth daily., Disp: , Rfl:  .  Multiple Vitamin (MULTIVITAMIN ADULT PO), Take 1 tablet by mouth daily in the afternoon., Disp: ,  Rfl:  .  promethazine (PHENERGAN) 25 MG tablet, Take 1 tablet (25 mg total) by mouth every 6 (six) hours as needed for nausea or vomiting., Disp: 20 tablet, Rfl: 0 .  rizatriptan (MAXALT) 5 MG tablet, Take 1 tablet (5 mg total) by mouth as needed for migraine. May repeat in 2 hours if needed, Disp: 10 tablet, Rfl: 3 .  Zinc 50 MG TABS, Take 1 tablet by mouth as needed., Disp: , Rfl:  .  zolpidem (AMBIEN) 10 MG tablet, 1/2 to 1 pill po qHS, Disp: 30 tablet, Rfl: 5  PAST MEDICAL HISTORY: Past Medical History:  Diagnosis Date  . Asthma   . Lichen planus   . MS (multiple sclerosis) ([place])   . PCOS (polycystic ovarian syndrome)   . Raynaud's disease     PAST SURGICAL HISTORY: Past Surgical History:  Procedure Laterality  Date  . colonoscopy    . DILATION AND CURETTAGE OF UTERUS     x 3  . ESOPHAGOGASTRODUODENOSCOPY    . TONSILLECTOMY AND ADENOIDECTOMY      FAMILY HISTORY: Family History  Problem Relation Age of Onset  . Diabetes Mother   . Thyroid disease Mother   . Hypertension Mother   . Hypercholesterolemia Mother   . Migraines Mother   . Depression Mother   . Diabetes Father   . Thyroid disease Father   . CAD Father   . Hypercholesterolemia Father   . Hypertension Father   . Depression Father   . Pernicious anemia Father   . Breast cancer Paternal Aunt     SOCIAL HISTORY:  Social History   Socioeconomic History  . Marital status: Married    Spouse name: Not on file  . Number of children: 0  . Years of education: college  . Highest education level: Professional school degree (e.g., MD, DDS, DVM, JD)  Occupational History  . Occupation: physician  Tobacco Use  . Smoking status: Never Smoker  . Smokeless tobacco: Never Used  Vaping Use  . Vaping Use: Never used  Substance and Sexual Activity  . Alcohol use: No  . Drug use: No  . Sexual activity: Not on file  Other Topics Concern  . Not on file  Social History Narrative   Right handed   1-2 cups caffeine  daily   Lives at home with husband   Social Determinants of Health   Financial Resource Strain: Not on file  Food Insecurity: Not on file  Transportation Needs: Not on file  Physical Activity: Not on file  Stress: Not on file  Social Connections: Not on file  Intimate Partner Violence: Not on file     PHYSICAL EXAM  Vitals:   05/11/20 0803  BP: 113/76  Pulse: 89  Weight: 168 lb (76.2 kg)  Height: 5\' 5"  (1.651 m)    Body mass index is 27.96 kg/m.   General: The patient is well-developed and well-nourished and in no acute distress  HEENT:  Head is [place]/AT.  Sclera are anicteric.    Skin: Extremities are without rash or  edema.  Neurologic Exam  Mental status: The patient is alert and oriented x 3 at the time of the examination. The patient has apparent normal recent and remote memory, with an apparently normal attention span and concentration ability.   Speech is normal.  Cranial nerves: Extraocular movements are full.  Color vision was symmetric.  Facial strength is symmetric.  No obvious hearing deficits are noted.  Motor:  Muscle bulk is normal.   Tone is normal. Strength is  5 / 5 in all 4 extremities.   Sensory: Sensory testing is intact to pinprick, soft touch and vibration sensation in all 4 extremities.  Normal temperature sensation in the trunk  Coordination: Cerebellar testing reveals good finger-nose-finger and heel-to-shin bilaterally.  Gait and station: Station is normal.   Gait is normal.  Tandem gait is minimally wide.  Romberg is normal..   Reflexes: Deep tendon reflexes are symmetric and normal bilaterally.        DIAGNOSTIC DATA (LABS, IMAGING, TESTING) - I reviewed patient records, labs, notes, testing and imaging myself where available.  Lab Results  Component Value Date   WBC 10.3 10/12/2019   HGB 11.9 (L) 10/12/2019   HCT 38.5 10/12/2019   MCV 73.6 (L) 10/12/2019   PLT 294 10/12/2019      Component Value  Date/Time   NA 138  10/12/2019 0005   K 3.6 10/12/2019 0005   CL 106 10/12/2019 0005   CO2 21 (L) 10/12/2019 0005   GLUCOSE 125 (H) 10/12/2019 0005   BUN 10 10/12/2019 0005   CREATININE 0.84 10/12/2019 0005   CALCIUM 8.6 (L) 10/12/2019 0005   PROT 6.2 (L) 10/12/2019 0005   ALBUMIN 3.6 10/12/2019 0005   AST 21 10/12/2019 0005   ALT 30 10/12/2019 0005   ALKPHOS 41 10/12/2019 0005   BILITOT 0.4 10/12/2019 0005   GFRNONAA >60 10/12/2019 0005   GFRAA >60 10/12/2019 0005       ASSESSMENT AND PLAN  No diagnosis found.   1.  Continue Vumerity.  She has recent blood work and lymphocyte count was 1.3.   2.  If migraines worsen consider prophylactic agent.   3.   Renew Ambien as needed. 4.   Return to see me in 6 months or sooner for new or worsening neurologic symptoms.   Richard A. Felecia Shelling, MD, [facility] 04/13/5186, 4:16 AM Certified in Neurology, Clinical Neurophysiology, Sleep Medicine and Neuroimaging  [facility] [address] [place], [place] [address] [phone]

## 2020-05-11 NOTE — Patient Instructions (Addendum)
See me again in 6 weeks 

## 2020-05-17 ENCOUNTER — Other Ambulatory Visit (HOSPITAL_COMMUNITY): Payer: Self-pay | Admitting: Gastroenterology

## 2020-05-17 DIAGNOSIS — K219 Gastro-esophageal reflux disease without esophagitis: Secondary | ICD-10-CM | POA: Diagnosis not present

## 2020-05-24 MED FILL — WEGOVY 2.4 MG/0.75ML SOAJ: 2.4 | 28 days supply | Qty: 3 | Fill #2

## 2020-06-04 ENCOUNTER — Other Ambulatory Visit (HOSPITAL_COMMUNITY): Payer: Self-pay

## 2020-06-21 NOTE — Progress Notes (Signed)
[facility] [address] [place] Phone: [phone] Subjective:   I Martha [name] am serving as a Education administrator for Dr. Hulan Saas.  This visit occurred during the SARS-CoV-2 public health emergency.  Safety protocols were in place, including screening questions prior to the visit, additional usage of staff PPE, and extensive cleaning of exam room while observing appropriate contact time as indicated for disinfecting solutions.   I'm seeing this patient by the request  of:  Patient, No Pcp Per (Inactive)  CC: Back pain follow-up  FAO:[id number]  Martha [name] is a 40 y.o. female coming in with complaint of back and neck pain. OMT 05/11/2020. Patient states she is doing well today. States she has hurt her fingers. Middle finger on the right hand was swollen and states she couldn't bend it. First finger is painful as well. Believes she slept on it wrong. Has been using ice.   Medications patient has been prescribed: None          Reviewed prior external information including notes and imaging from previsou exam, outside providers and external EMR if available.   As well as notes that were available from care everywhere and other healthcare systems.  Past medical history, social, surgical and family history all reviewed in electronic medical record.  No pertanent information unless stated regarding to the chief complaint.   Past Medical History:  Diagnosis Date  . Asthma   . Lichen planus   . MS (multiple sclerosis) ([place])   . PCOS (polycystic ovarian syndrome)   . Raynaud's disease     No Known Allergies   Review of Systems:  No headache, visual changes, nausea, vomiting, diarrhea, constipation, dizziness, abdominal pain, skin rash, fevers, chills, night sweats, weight loss, swollen lymph nodes, body aches, joint swelling, chest pain, shortness of breath, mood changes. POSITIVE muscle aches  Objective  Blood pressure 110/80,  height 5\' 5"  (1.651 m), weight 157 lb (71.2 kg).   General: No apparent distress alert and oriented x3 mood and affect normal, dressed appropriately.  Patient has lost a good amount of weight HEENT: Pupils equal, extraocular movements intact  Respiratory: Patient's speak in full sentences and does not appear short of breath  Cardiovascular: No lower extremity edema, non tender, no erythema  Gait normal with good balance and coordination.  MSK:  Non tender with full range of motion and good stability and symmetric strength and tone of shoulders, elbows, wrist, hip, knee and ankles bilaterally.  Back -low back exam does have some tightness mostly in the thoracolumbar junction right greater than left.  Tenderness to palpation in paraspinal musculature lumbar spine right greater than left.  Osteopathic findings   T3 extended rotated and side bent right inhaled rib T10 extended rotated and side bent left L1 flexed rotated and side bent right Sacrum right on right       Assessment and Plan: Thoracic back pain Continue mild to moderate pain in the thoracic spine.  Patient continues to try to be active.  Has lost weight which is fantastic.  Encouraged her to continue to do so.  Patient has made significant progress.  Does have a very large amount of breast tissue and history of multiple sclerosis that also contributes to it.  Does respond well to osteopathic manipulation.  We discussed vitamin supplementations.  Follow-up again in 2 months     Nonallopathic problems  Decision today to treat with OMT was based on Physical Exam  After verbal  consent patient was treated with HVLA, ME, FPR techniques in thoracic, lumbar, and sacral  areas  Patient tolerated the procedure well with improvement in symptoms  Patient given exercises, stretches and lifestyle modifications  See medications in patient instructions if given  Patient will follow up in 4-8 weeks      The above documentation  has been reviewed and is accurate and complete Lyndal Pulley, DO       Note: This dictation was prepared with Dragon dictation along with smaller phrase technology. Any transcriptional errors that result from this process are unintentional.

## 2020-06-22 ENCOUNTER — Other Ambulatory Visit: Payer: Self-pay

## 2020-06-22 ENCOUNTER — Ambulatory Visit (INDEPENDENT_AMBULATORY_CARE_PROVIDER_SITE_OTHER): Payer: 59 | Admitting: Family Medicine

## 2020-06-22 ENCOUNTER — Encounter: Payer: Self-pay | Admitting: Family Medicine

## 2020-06-22 VITALS — BP 110/80 | Ht 65.0 in | Wt 157.0 lb

## 2020-06-22 DIAGNOSIS — M9903 Segmental and somatic dysfunction of lumbar region: Secondary | ICD-10-CM | POA: Diagnosis not present

## 2020-06-22 DIAGNOSIS — M9904 Segmental and somatic dysfunction of sacral region: Secondary | ICD-10-CM | POA: Diagnosis not present

## 2020-06-22 DIAGNOSIS — M9902 Segmental and somatic dysfunction of thoracic region: Secondary | ICD-10-CM | POA: Diagnosis not present

## 2020-06-22 DIAGNOSIS — G8929 Other chronic pain: Secondary | ICD-10-CM

## 2020-06-22 DIAGNOSIS — M546 Pain in thoracic spine: Secondary | ICD-10-CM | POA: Diagnosis not present

## 2020-06-22 MED FILL — Zolpidem Tartrate Tab 10 MG: ORAL | 30 days supply | Qty: 30 | Fill #0 | Status: AC

## 2020-06-22 MED FILL — Semaglutide (Weight Mngmt) Soln Auto-Injector 2.4 MG/0.75ML: SUBCUTANEOUS | 28 days supply | Qty: 3 | Fill #0 | Status: AC

## 2020-06-22 MED FILL — Esomeprazole Magnesium Cap Delayed Release 40 MG (Base Eq): ORAL | 30 days supply | Qty: 60 | Fill #0 | Status: AC

## 2020-06-22 NOTE — Patient Instructions (Signed)
Good to see you Keep me up dated  See me 6-8 weeks

## 2020-06-22 NOTE — Assessment & Plan Note (Signed)
Continue mild to moderate pain in the thoracic spine.  Patient continues to try to be active.  Has lost weight which is fantastic.  Encouraged her to continue to do so.  Patient has made significant progress.  Does have a very large amount of breast tissue and history of multiple sclerosis that also contributes to it.  Does respond well to osteopathic manipulation.  We discussed vitamin supplementations.  Follow-up again in 2 months

## 2020-06-23 ENCOUNTER — Other Ambulatory Visit (HOSPITAL_COMMUNITY): Payer: Self-pay

## 2020-07-07 ENCOUNTER — Other Ambulatory Visit (HOSPITAL_COMMUNITY): Payer: Self-pay

## 2020-07-07 DIAGNOSIS — E669 Obesity, unspecified: Secondary | ICD-10-CM | POA: Diagnosis not present

## 2020-07-07 DIAGNOSIS — R112 Nausea with vomiting, unspecified: Secondary | ICD-10-CM | POA: Diagnosis not present

## 2020-07-07 DIAGNOSIS — N979 Female infertility, unspecified: Secondary | ICD-10-CM | POA: Diagnosis not present

## 2020-07-07 DIAGNOSIS — E236 Other disorders of pituitary gland: Secondary | ICD-10-CM | POA: Diagnosis not present

## 2020-07-07 MED ORDER — ONDANSETRON HCL 4 MG PO TABS
4.0000 mg | ORAL_TABLET | Freq: Two times a day (BID) | ORAL | 3 refills | Status: DC | PRN
  Filled 2020-07-07 – 2020-08-07 (×2): qty 60, 30d supply, fill #0
  Filled 2021-02-13: qty 60, 30d supply, fill #1
  Filled 2021-05-15: qty 60, 30d supply, fill #2

## 2020-07-07 MED ORDER — WEGOVY 2.4 MG/0.75ML ~~LOC~~ SOAJ
2.4000 mg | SUBCUTANEOUS | 3 refills | Status: DC
  Filled 2020-07-07 – 2020-07-19 (×2): qty 3, 28d supply, fill #0
  Filled 2020-08-15: qty 3, 28d supply, fill #1
  Filled 2020-09-19: qty 3, 28d supply, fill #2
  Filled 2020-10-23: qty 3, 28d supply, fill #3

## 2020-07-11 ENCOUNTER — Other Ambulatory Visit: Payer: Self-pay

## 2020-07-11 ENCOUNTER — Encounter: Payer: Self-pay | Admitting: Cardiology

## 2020-07-11 ENCOUNTER — Ambulatory Visit (INDEPENDENT_AMBULATORY_CARE_PROVIDER_SITE_OTHER): Payer: 59 | Admitting: Cardiology

## 2020-07-11 VITALS — BP 112/66 | HR 108 | Ht 65.0 in | Wt 158.4 lb

## 2020-07-11 DIAGNOSIS — R002 Palpitations: Secondary | ICD-10-CM | POA: Diagnosis not present

## 2020-07-11 NOTE — Progress Notes (Signed)
Electrophysiology Office Note   Date:  07/11/2020   ID:  Martha [name], DOB Oct 24, 1980, MRN [id number]  PCP:  Patient, No Pcp Per (Inactive)  Cardiologist:  [place] Primary Electrophysiologist:  Amjad Fikes Meredith Leeds, MD    Chief Complaint: palpitations   History of Present Illness: Martha [name] is a 40 y.o. female who is being seen today for the evaluation of palpitations at the request of No ref. provider found. Presenting today for electrophysiology evaluation.  She has a history significant for relapsing and remitting multiple sclerosis, PCOS, GERD, Raynaud's syndrome.  She was previously followed by cardiology due to an episode of chest discomfort.  She felt numbness in her left arm and presented to the emergency room.  She was found to be in tachycardic with an ECG showing sinus tachycardia.  She recently wore a cardiac monitor that showed episodes of sinus rhythm and sinus tachycardia.  She had symptoms of fluttering and shortness of breath associated with sinus tachycardia and heart rates up to 122 bpm.  Today, denies symptoms of palpitations, chest pain, shortness of breath, orthopnea, PND, lower extremity edema, claudication, dizziness, presyncope, syncope, bleeding, or neurologic sequela. The patient is tolerating medications without difficulties.  Since last being seen she has done well.  She has no chest pain or shortness of breath.  She is able do all of her daily activities.  She has take diltiazem on a very intermittent basis.  Her palpitations are greatly improved.   Past Medical History:  Diagnosis Date  . Asthma   . Lichen planus   . MS (multiple sclerosis) ([place])   . PCOS (polycystic ovarian syndrome)   . Raynaud's disease    Past Surgical History:  Procedure Laterality Date  . colonoscopy    . DILATION AND CURETTAGE OF UTERUS     x 3  . ESOPHAGOGASTRODUODENOSCOPY    . TONSILLECTOMY AND ADENOIDECTOMY       Current Outpatient Medications  Medication Sig  Dispense Refill  . Ascorbic Acid (VITAMIN C) 1000 MG tablet Take 1 tablet by mouth as needed.    Marland Kitchen aspirin EC 81 MG tablet Take 81 mg by mouth daily.    Marland Kitchen CALCIUM PO Take 500 mg by mouth daily. +Vit D3 (40mcg)    . Cholecalciferol (VITAMIN D) 125 MCG (5000 UT) CAPS Take 1 capsule by mouth once a week.    . cyclobenzaprine (FLEXERIL) 5 MG tablet TAKE 1 TABLET (5 MG TOTAL) BY MOUTH 3 (THREE) TIMES DAILY AS NEEDED FOR MUSCLE SPASMS. 60 tablet 1  . Diclofenac Sodium (PENNSAID) 2 % SOLN Place 2 g onto the skin 2 (two) times daily. 112 g 3  . diltiazem (CARDIZEM) 30 MG tablet TAKE 1 TABLET (30 MG TOTAL) BY MOUTH 4 (FOUR) TIMES DAILY AS NEEDED (FOR PALPITATIONS/ELEVATED HEART RATES). 30 tablet 2  . Diroximel Fumarate (VUMERITY) 231 MG CPDR Take 231 mg by mouth in the morning and at bedtime. 120 capsule 11  . esomeprazole (NEXIUM) 40 MG capsule TAKE 1 CAPSULE BY MOUTH ONCE DAILY 90 capsule 3  . MAGNESIUM PO Take 200 mg by mouth daily.    . Multiple Vitamin (MULTIVITAMIN ADULT PO) Take 1 tablet by mouth daily in the afternoon.    . ondansetron (ZOFRAN) 4 MG tablet Take 1 tablet (4 mg total) by mouth 2 (two) times daily as needed. 60 tablet 3  . promethazine (PHENERGAN) 25 MG tablet Take 1 tablet (25 mg total) by mouth every 6 (six) hours as needed for nausea or  vomiting. 20 tablet 0  . rizatriptan (MAXALT) 5 MG tablet TAKE 1 TABLET (5 MG TOTAL) BY MOUTH AS NEEDED FOR MIGRAINE. MAY REPEAT IN 2 HOURS IF NEEDED 10 tablet 3  . Zinc 50 MG TABS Take 1 tablet by mouth as needed.    . zolpidem (AMBIEN) 10 MG tablet TAKE 1/2 TO 1 TABLET BY MOUTH AT BEDTIME 30 tablet 5  . esomeprazole (NEXIUM) 40 MG capsule TAKE 1 CAPSULE BY MOUTH TWICE DAILY 30 MINUTES BEFORE BREAKFAST AND DINNER. (Patient not taking: Reported on 07/11/2020) 60 capsule 11  . influenza vac split quadrivalent PF (FLUARIX) 0.5 ML injection TO BE ADMINISTERED BY PHARMACIST (Patient not taking: Reported on 07/11/2020) .5 mL 0  . ketoconazole (NIZORAL) 2 %  shampoo APPLY 5ML'S EXTERNALLY 2 TO 3 TIMES PER WEEK (Patient not taking: Reported on 07/11/2020) 120 mL 2  . Semaglutide-Weight Management (WEGOVY) 2.4 MG/0.75ML SOAJ Inject 2.4 mg into the skin once a week. (Patient not taking: Reported on 07/11/2020) 3 mL 3  . Semaglutide-Weight Management 1 MG/0.5ML SOAJ INJECT 1 MG INTO THE SKIN ONCE A WEEK (Patient not taking: Reported on 07/11/2020) 2 mL 0  . Semaglutide-Weight Management 1.7 MG/0.75ML SOAJ INJECT 1.7MG  EVERY WEEK BY SUBCUTANEOUS ROUTE (Patient not taking: Reported on 07/11/2020) 3 mL 0   No current facility-administered medications for this visit.    Allergies:   Patient has no known allergies.   Social History:  The patient  reports that she has never smoked. She has never used smokeless tobacco. She reports that she does not drink alcohol and does not use drugs.   Family History:  The patient's family history includes Breast cancer in her paternal aunt; CAD in her father; Depression in her father and mother; Diabetes in her father and mother; Hypercholesterolemia in her father and mother; Hypertension in her father and mother; Migraines in her mother; Pernicious anemia in her father; Thyroid disease in her father and mother.   ROS:  Please see the history of present illness.   Otherwise, review of systems is positive for none.   All other systems are reviewed and negative.   PHYSICAL EXAM: VS:  BP 112/66   Pulse (!) 108   Ht 5\' 5"  (1.651 m)   Wt 158 lb 6.4 oz (71.8 kg)   BMI 26.36 kg/m  , BMI Body mass index is 26.36 kg/m. GEN: Well nourished, well developed, in no acute distress  HEENT: normal  Neck: no JVD, carotid bruits, or masses Cardiac: RRR; no murmurs, rubs, or gallops,no edema  Respiratory:  clear to auscultation bilaterally, normal work of breathing GI: soft, nontender, nondistended, + BS MS: no deformity or atrophy  Skin: warm and dry Neuro:  Strength and sensation are intact Psych: euthymic mood, full affect  EKG:   EKG is not ordered today. Personal review of the ekg ordered 03/15/20 shows sinus rhythm, rate 89  Recent Labs: 10/12/2019: ALT 30; BUN 10; Creatinine, Ser 0.84; Hemoglobin 11.9; Platelets 294; Potassium 3.6; Sodium 138    Lipid Panel  No results found for: CHOL, TRIG, HDL, CHOLHDL, VLDL, LDLCALC, LDLDIRECT   Wt Readings from Last 3 Encounters:  07/11/20 158 lb 6.4 oz (71.8 kg)  06/22/20 157 lb (71.2 kg)  05/11/20 168 lb (76.2 kg)      Other studies Reviewed: Additional studies/ records that were reviewed today include: TTE 10/28/19  Review of the above records today demonstrates:  1. Left ventricular ejection fraction, by estimation, is 60 to 65%. The  left  ventricle has normal function. The left ventricle has no regional  wall motion abnormalities. Left ventricular diastolic parameters were  normal.  2. Right ventricular systolic function is normal. The right ventricular  size is normal.  3. The mitral valve is normal in structure. Trivial mitral valve  regurgitation. No evidence of mitral stenosis.  4. The aortic valve is normal in structure. Aortic valve regurgitation is  not visualized. No aortic stenosis is present.  5. The inferior vena cava is normal in size with greater than 50%  respiratory variability, suggesting right atrial pressure of 3 mmHg.   Cardiac monitor 03/15/2020 personally reviewed Max 168 bpm 01:56pm, 01/28 Min 56 bpm 06:06am, 01/28 Avg 88 bpm Less than 1% ventricular and supraventricular ectopy The predominant rhythm was sinus rhythm Symptoms of fluttering, shortness of breath, and triggered episodes associated with both sinus rhythm and sinus tachycardia, rates up to 122 bpm No other arrhythmias noted  ASSESSMENT AND PLAN:  1.  Palpitations: She has episodes of possible atrial tachycardia.  She is currently on diltiazem.  Since starting her diltiazem she has done much better.  We Lavonne Cass continue with current management.  Current medicines are  reviewed at length with the patient today.   The patient does not have concerns regarding her medicines.  The following changes were made today: None  Labs/ tests ordered today include:  No orders of the defined types were placed in this encounter.    Disposition:   FU with Diedra Sinor pending 12 months  Signed, Eulene Pekar Meredith Leeds, MD  07/11/2020 2:15 PM     [place] [address] [phone] (office) [phone] (fax)

## 2020-07-11 NOTE — Patient Instructions (Signed)
Medication Instructions:  °Your physician recommends that you continue on your current medications as directed. Please refer to the Current Medication list given to you today. ° °*If you need a refill on your cardiac medications before your next appointment, please call your pharmacy* ° ° °Lab Work: °None ordered ° ° °Testing/Procedures: °None ordered ° ° °Follow-Up: °At [facility], you and your health needs are our priority.  As part of our continuing mission to provide you with exceptional heart care, we have created designated Provider Care Teams.  These Care Teams include your primary Cardiologist (physician) and Advanced Practice Providers (APPs -  Physician Assistants and Nurse Practitioners) who all work together to provide you with the care you need, when you need it. ° °Your next appointment:   °1 year(s) ° °The format for your next appointment:   °In Person ° °Provider:   °Will Camnitz, MD ° ° ° °Thank you for choosing [facility]!! ° ° °Vickie Melnik, RN °[phone] ° ° ° °

## 2020-07-15 ENCOUNTER — Other Ambulatory Visit (HOSPITAL_COMMUNITY): Payer: Self-pay

## 2020-07-19 ENCOUNTER — Other Ambulatory Visit (HOSPITAL_COMMUNITY): Payer: Self-pay

## 2020-07-20 ENCOUNTER — Other Ambulatory Visit (HOSPITAL_COMMUNITY): Payer: Self-pay

## 2020-08-02 NOTE — Progress Notes (Signed)
  [place] Phone: [phone] Subjective:   [name], am serving as a scribe for Dr. Hulan Saas. This visit occurred during the SARS-CoV-2 public health emergency.  Safety protocols were in place, including screening questions prior to the visit, additional usage of staff PPE, and extensive cleaning of exam room while observing appropriate contact time as indicated for disinfecting solutions.   I'm seeing this patient by the request  of:  Patient, No Pcp Per (Inactive)  CC: Upper back and low back pain follow-up  POE:[id number]  Martha [name] is a 40 y.o. female coming in with complaint of back and neck pain. OMT 06/22/2020. Patient states that she continues to have pain in back and neck.   Medications patient has been prescribed: Flexeril  Taking: Intermittently         Past Medical History:  Diagnosis Date  . Asthma   . Lichen planus   . MS (multiple sclerosis) ([place])   . PCOS (polycystic ovarian syndrome)   . Raynaud's disease     No Known Allergies   Review of Systems:  No headache, visual changes, nausea, vomiting, diarrhea, constipation, dizziness, abdominal pain, skin rash, fevers, chills, night sweats, weight loss, swollen lymph nodes, joint swelling, chest pain, shortness of breath, mood changes. POSITIVE muscle aches, body aches  Objective  Blood pressure 102/78, pulse (!) 113, height 5\' 5"  (1.651 m), weight 150 lb (68 kg), SpO2 99 %.   General: No apparent distress alert and oriented x3 mood and affect normal, dressed appropriately.  HEENT: Pupils equal, extraocular movements intact  Respiratory: Patient's speak in full sentences and does not appear short of breath  Cardiovascular: No lower extremity edema, non tender, no erythema  Gait normal with good balance and coordination.  MSK:  Non tender with full range of motion and good stability and symmetric strength and tone of  shoulders, elbows, wrist, hip, knee and ankles bilaterally.  Back -upper back does have tightness noted in the right parascapular region.  Patient does have some mild anterior displacement of the shoulders.  Patient noted does have full range of motion of the shoulders.  Tightness noted in the thoracolumbar juncture and minorly in the lumbosacral area  Osteopathic findings  T3 extended rotated and side bent right inhaled rib T9 extended rotated and side bent left L2 flexed rotated and side bent right       Assessment and Plan: Thoracic back pain 2/2 to posture and large breast tissue, discussed HEP, Discussed ergonomics, responding to OMT  RTC in 6 weeks       Nonallopathic problems  Decision today to treat with OMT was based on Physical Exam  After verbal consent patient was treated with HVLA, ME, FPR techniques in  rib, thoracic, lumbar,   areas  Patient tolerated the procedure well with improvement in symptoms  Patient given exercises, stretches and lifestyle modifications  See medications in patient instructions if given  Patient will follow up in 4-8 weeks      The above documentation has been reviewed and is accurate and complete Lyndal Pulley, DO       Note: This dictation was prepared with Dragon dictation along with smaller phrase technology. Any transcriptional errors that result from this process are unintentional.

## 2020-08-03 ENCOUNTER — Other Ambulatory Visit: Payer: Self-pay

## 2020-08-03 ENCOUNTER — Ambulatory Visit (INDEPENDENT_AMBULATORY_CARE_PROVIDER_SITE_OTHER): Payer: 59 | Admitting: Family Medicine

## 2020-08-03 ENCOUNTER — Encounter: Payer: Self-pay | Admitting: Family Medicine

## 2020-08-03 VITALS — BP 102/78 | HR 113 | Ht 65.0 in | Wt 150.0 lb

## 2020-08-03 DIAGNOSIS — M9902 Segmental and somatic dysfunction of thoracic region: Secondary | ICD-10-CM

## 2020-08-03 DIAGNOSIS — G8929 Other chronic pain: Secondary | ICD-10-CM

## 2020-08-03 DIAGNOSIS — M9903 Segmental and somatic dysfunction of lumbar region: Secondary | ICD-10-CM

## 2020-08-03 DIAGNOSIS — M546 Pain in thoracic spine: Secondary | ICD-10-CM | POA: Diagnosis not present

## 2020-08-03 DIAGNOSIS — M9908 Segmental and somatic dysfunction of rib cage: Secondary | ICD-10-CM

## 2020-08-03 NOTE — Assessment & Plan Note (Signed)
2/2 to posture and large breast tissue, discussed HEP, Discussed ergonomics, responding to OMT  RTC in 6 weeks

## 2020-08-07 MED FILL — Rizatriptan Benzoate Tab 5 MG (Base Equivalent): ORAL | 20 days supply | Qty: 10 | Fill #0 | Status: AC

## 2020-08-07 MED FILL — Esomeprazole Magnesium Cap Delayed Release 40 MG (Base Eq): ORAL | 30 days supply | Qty: 60 | Fill #1 | Status: AC

## 2020-08-08 ENCOUNTER — Other Ambulatory Visit: Payer: Self-pay | Admitting: *Deleted

## 2020-08-08 ENCOUNTER — Other Ambulatory Visit (HOSPITAL_COMMUNITY): Payer: Self-pay

## 2020-08-08 ENCOUNTER — Other Ambulatory Visit (HOSPITAL_BASED_OUTPATIENT_CLINIC_OR_DEPARTMENT_OTHER): Payer: Self-pay

## 2020-08-08 MED ORDER — ZOLPIDEM TARTRATE 10 MG PO TABS
5.0000 mg | ORAL_TABLET | Freq: Every day | ORAL | 5 refills | Status: DC
  Filled 2020-08-08: qty 30, 30d supply, fill #0
  Filled 2020-09-19: qty 30, 30d supply, fill #1
  Filled 2020-10-23: qty 30, 30d supply, fill #2
  Filled 2020-12-25: qty 30, 30d supply, fill #3

## 2020-08-15 ENCOUNTER — Other Ambulatory Visit (HOSPITAL_COMMUNITY): Payer: Self-pay

## 2020-08-22 ENCOUNTER — Telehealth: Payer: Self-pay | Admitting: *Deleted

## 2020-08-22 NOTE — Telephone Encounter (Signed)
Submitted PA Vumerity on CMM. Key: [id number]. Waiting on determination from [place].

## 2020-08-22 NOTE — Telephone Encounter (Signed)
The request has been approved. The authorization is effective for a maximum of 12 fills from 08/22/2020 to 08/21/2021, as long as the member is enrolled in their current health plan. This has been approved for a max daily dosage of 4. A written notification letter will follow with additional details.

## 2020-09-19 MED FILL — Esomeprazole Magnesium Cap Delayed Release 40 MG (Base Eq): ORAL | 30 days supply | Qty: 60 | Fill #2 | Status: AC

## 2020-09-19 NOTE — Progress Notes (Signed)
Martha [name] Phone: [phone] Subjective:   Martha [name], am serving as a scribe for Dr. Hulan Saas.  This visit occurred during the SARS-CoV-2 public health emergency.  Safety protocols were in place, including screening questions prior to the visit, additional usage of staff PPE, and extensive cleaning of exam room while observing appropriate contact time as indicated for disinfecting solutions.    I'm seeing this patient by the request  of:  Patient, [name] (Inactive)  CC: Back and neck pain follow-up  [id number]  Martha [name] is a 40 y.o. female coming in with complaint of back and neck pain. OMT 08/03/2020. Patient states that mid-thoracic spine has been more painful recently.  Patient has been sitting a little bit more.  Has had some increasing stress with patient having another role at work.  Patient is having difficulty getting used to this and is not quite as active.  Has been trying to start to increase walking on a more regular basis.  Medications patient has been prescribed: None         Past Medical History:  Diagnosis Date   Asthma    Lichen planus    MS (multiple sclerosis) ([facility])    PCOS (polycystic ovarian syndrome)    Raynaud's disease     [name] Known Allergies   Review of Systems:  [name] headache, visual changes, nausea, vomiting, diarrhea, constipation, dizziness, abdominal pain, skin rash, fevers, chills, night sweats, weight loss, swollen lymph nodes, body aches, joint swelling, chest pain, shortness of breath, mood changes. POSITIVE muscle aches  Objective  Blood pressure 112/84, height 5\' 5"  (1.651 m), weight 144 lb (65.3 kg).   General: [name] apparent distress alert and oriented x3 mood and affect normal, dressed appropriately.  HEENT: Pupils equal, extraocular movements intact  Respiratory: Patient's speak in full sentences and does not appear short of breath   Cardiovascular: [name] lower extremity edema, non tender, [name] erythema  Low back exam shows tenderness mostly in the thoracolumbar juncture patient is having pain in the parascapular region bilaterally right greater than left.  Full range of motion of the shoulders noted.  Some tenderness at the thoracolumbar juncture right greater than left.  Osteopathic findings  T5 extended rotated and side bent right with inhaled rib T9 extended rotated and side bent left L2 flexed rotated and side bent right Sacrum right on right       Assessment and Plan:  Thoracic back pain Chronic, with mild exacerbation.  The patient has been doing relatively well but is doing more seated work at a computer that I think is contributing.  I do believe patient is also having some increasing stress recently.  To consider going on a week but has been trying to do so.  Discussed icing regimen and home exercises.  Increase activity slowly.  Follow-up with me again in 6 to 8 weeks.   Nonallopathic problems  Decision today to treat with OMT was based on Physical Exam  After verbal consent patient was treated with HVLA, ME, FPR techniques in  thoracic, lumbar, and sacral  areas  Patient tolerated the procedure well with improvement in symptoms  Patient given exercises, stretches and lifestyle modifications  See medications in patient instructions if given  Patient will follow up in 4-8 weeks      The above documentation has been reviewed and is accurate and complete Lyndal Pulley, DO  Note: This dictation was prepared with Dragon dictation along with smaller phrase technology. Any transcriptional errors that result from this process are unintentional.

## 2020-09-20 ENCOUNTER — Encounter: Payer: Self-pay | Admitting: Family Medicine

## 2020-09-20 ENCOUNTER — Ambulatory Visit (INDEPENDENT_AMBULATORY_CARE_PROVIDER_SITE_OTHER): Payer: 59 | Admitting: Family Medicine

## 2020-09-20 ENCOUNTER — Other Ambulatory Visit (HOSPITAL_COMMUNITY): Payer: Self-pay

## 2020-09-20 ENCOUNTER — Other Ambulatory Visit: Payer: Self-pay

## 2020-09-20 VITALS — BP 112/84 | Ht 65.0 in | Wt 144.0 lb

## 2020-09-20 DIAGNOSIS — M9901 Segmental and somatic dysfunction of cervical region: Secondary | ICD-10-CM

## 2020-09-20 DIAGNOSIS — M9908 Segmental and somatic dysfunction of rib cage: Secondary | ICD-10-CM

## 2020-09-20 DIAGNOSIS — G8929 Other chronic pain: Secondary | ICD-10-CM

## 2020-09-20 DIAGNOSIS — M9902 Segmental and somatic dysfunction of thoracic region: Secondary | ICD-10-CM

## 2020-09-20 DIAGNOSIS — M546 Pain in thoracic spine: Secondary | ICD-10-CM

## 2020-09-20 NOTE — Patient Instructions (Signed)
See me again in 4-5 weeks

## 2020-09-20 NOTE — Assessment & Plan Note (Addendum)
Chronic, with mild exacerbation.  The patient has been doing relatively well but is doing more seated work at a computer that I think is contributing.  I do believe patient is also having some increasing stress recently.  To consider going on a week but has been trying to do so.  Discussed icing regimen and home exercises.  Increase activity slowly.  Follow-up with me again in 6 to 8 weeks.

## 2020-09-22 ENCOUNTER — Other Ambulatory Visit: Payer: Self-pay | Admitting: *Deleted

## 2020-09-22 DIAGNOSIS — G35 Multiple sclerosis: Secondary | ICD-10-CM

## 2020-09-22 MED ORDER — VUMERITY 231 MG PO CPDR: 231.0000 mg | DELAYED_RELEASE_CAPSULE | Freq: Two times a day (BID) | ORAL | 11 refills | Status: DC

## 2020-09-30 ENCOUNTER — Telehealth: Payer: Self-pay | Admitting: Neurology

## 2020-09-30 DIAGNOSIS — G35 Multiple sclerosis: Secondary | ICD-10-CM

## 2020-09-30 NOTE — Telephone Encounter (Signed)
[facility]) called, clarification for Diroximel Fumarate (VUMERITY) 231 MG CPDR quantity and dosage. Would like a call from the nurse.

## 2020-10-02 MED FILL — Rizatriptan Benzoate Tab 5 MG (Base Equivalent): ORAL | 20 days supply | Qty: 10 | Fill #1 | Status: AC

## 2020-10-03 ENCOUNTER — Other Ambulatory Visit: Payer: Self-pay | Admitting: *Deleted

## 2020-10-03 ENCOUNTER — Other Ambulatory Visit (HOSPITAL_COMMUNITY): Payer: Self-pay

## 2020-10-03 DIAGNOSIS — G35 Multiple sclerosis: Secondary | ICD-10-CM

## 2020-10-03 DIAGNOSIS — K219 Gastro-esophageal reflux disease without esophagitis: Secondary | ICD-10-CM | POA: Diagnosis not present

## 2020-10-03 DIAGNOSIS — E611 Iron deficiency: Secondary | ICD-10-CM | POA: Diagnosis not present

## 2020-10-03 DIAGNOSIS — R110 Nausea: Secondary | ICD-10-CM | POA: Diagnosis not present

## 2020-10-03 MED ORDER — PROMETHAZINE HCL 12.5 MG PO TABS
12.5000 mg | ORAL_TABLET | Freq: Four times a day (QID) | ORAL | 0 refills | Status: DC | PRN
  Filled 2020-10-03: qty 30, 4d supply, fill #0

## 2020-10-03 MED ORDER — VUMERITY 231 MG PO CPDR: 462.0000 mg | DELAYED_RELEASE_CAPSULE | Freq: Two times a day (BID) | ORAL | 11 refills | Status: DC

## 2020-10-03 NOTE — Telephone Encounter (Signed)
Called and spoke w/ Desiree and who transferred me to pharmacist. Advised '231mg'$  po BID sent in error. Should be '462mg'$  po BID #120, 11 refills. She verbalized understanding and will get rx ready for pt.

## 2020-10-21 NOTE — Progress Notes (Signed)
[place] Phone: [phone] Subjective:   [name], am serving as a scribe for Dr. Hulan Saas.  This visit occurred during the SARS-CoV-2 public health emergency.  Safety protocols were in place, including screening questions prior to the visit, additional usage of staff PPE, and extensive cleaning of exam room while observing appropriate contact time as indicated for disinfecting solutions.    I'm seeing this patient by the request  of:  Patient, No Pcp Per (Inactive)  CC: Upper back pain  [id number]  Jeidi Garbin is a 40 y.o. female coming in with complaint of back and neck pain. OMT 09/20/2020. Patient states that she is having L scapula tenderness. Also increase in pain in thoracic spine patient states this morning.  Has been doing a lot more working at home.  Has been doing a lot of driving that seems to have aggravated it as well.  Patient denies any radiation down the extremities.  Denies any dizziness or lightheadedness.  Patient is having a flare from her multiple sclerosis.  Medications patient has been prescribed: Flexeril  Taking:          Past Medical History:  Diagnosis Date   Asthma    Lichen planus    MS (multiple sclerosis) (HCC)    PCOS (polycystic ovarian syndrome)    Raynaud's disease     No Known Allergies   Review of Systems:  No headache, visual changes, nausea, vomiting, diarrhea, constipation, dizziness, abdominal pain, skin rash, fevers, chills, night sweats, weight loss, swollen lymph nodes, body aches, joint swelling, chest pain, shortness of breath, mood changes. POSITIVE muscle aches  Objective  Blood pressure 110/72, height '5\' 5"'$  (1.651 m).   General: No apparent distress alert and oriented x3 mood and affect normal, dressed appropriately.  HEENT: Pupils equal, extraocular movements intact  Respiratory: Patient's speak in full sentences and does not  appear short of breath  Cardiovascular: No lower extremity edema, non tender, no erythema  Significant increased tightness noted in left-sided scapula.  Patient does have multiple trigger points noted.  Negative Spurling's of the neck.  Osteopathic findings   C7 flexed rotated and side bent left T3 extended rotated and side bent left inhaled rib T9 extended rotated and side bent left L2 flexed rotated and side bent right Sacrum right on right       Assessment and Plan:  Thoracic back pain Patient does have a trigger points noted of the left scapular region.  Discussed with patient about potentially needing injections if this continues.  Patient would like to go with the manipulation which she responded to well today and is going to be having a massage.  No change in medications at the moment.  Follow-up again in 6 to 8 weeks.   Nonallopathic problems  Decision today to treat with OMT was based on Physical Exam  After verbal consent patient was treated with HVLA, ME, FPR techniques in cervical, rib, thoracic, lumbar, and sacral  areas muscle energy only on the cervical  Patient tolerated the procedure well with improvement in symptoms  Patient given exercises, stretches and lifestyle modifications  See medications in patient instructions if given  Patient will follow up in 4-8 weeks      The above documentation has been reviewed and is accurate and complete Lyndal Pulley, DO       Note: This dictation was prepared with Dragon dictation along with smaller phrase technology. Any  transcriptional errors that result from this process are unintentional.

## 2020-10-23 MED FILL — Esomeprazole Magnesium Cap Delayed Release 40 MG (Base Eq): ORAL | 30 days supply | Qty: 60 | Fill #3 | Status: AC

## 2020-10-24 ENCOUNTER — Other Ambulatory Visit: Payer: Self-pay

## 2020-10-24 ENCOUNTER — Encounter: Payer: Self-pay | Admitting: Family Medicine

## 2020-10-24 ENCOUNTER — Other Ambulatory Visit (HOSPITAL_COMMUNITY): Payer: Self-pay

## 2020-10-24 ENCOUNTER — Ambulatory Visit (INDEPENDENT_AMBULATORY_CARE_PROVIDER_SITE_OTHER): Payer: 59 | Admitting: Family Medicine

## 2020-10-24 VITALS — BP 110/72 | Ht 65.0 in

## 2020-10-24 DIAGNOSIS — G8929 Other chronic pain: Secondary | ICD-10-CM

## 2020-10-24 DIAGNOSIS — M9903 Segmental and somatic dysfunction of lumbar region: Secondary | ICD-10-CM | POA: Diagnosis not present

## 2020-10-24 DIAGNOSIS — M9904 Segmental and somatic dysfunction of sacral region: Secondary | ICD-10-CM | POA: Diagnosis not present

## 2020-10-24 DIAGNOSIS — M9908 Segmental and somatic dysfunction of rib cage: Secondary | ICD-10-CM | POA: Diagnosis not present

## 2020-10-24 DIAGNOSIS — M9902 Segmental and somatic dysfunction of thoracic region: Secondary | ICD-10-CM

## 2020-10-24 DIAGNOSIS — M546 Pain in thoracic spine: Secondary | ICD-10-CM | POA: Diagnosis not present

## 2020-10-24 DIAGNOSIS — M9901 Segmental and somatic dysfunction of cervical region: Secondary | ICD-10-CM

## 2020-10-24 NOTE — Assessment & Plan Note (Signed)
Patient does have a trigger points noted of the left scapular region.  Discussed with patient about potentially needing injections if this continues.  Patient would like to go with the manipulation which she responded to well today and is going to be having a massage.  No change in medications at the moment.  Follow-up again in 6 to 8 weeks.

## 2020-10-25 ENCOUNTER — Encounter: Payer: Self-pay | Admitting: Family Medicine

## 2020-10-28 ENCOUNTER — Other Ambulatory Visit (HOSPITAL_COMMUNITY): Payer: Self-pay

## 2020-10-28 DIAGNOSIS — E669 Obesity, unspecified: Secondary | ICD-10-CM | POA: Diagnosis not present

## 2020-10-28 DIAGNOSIS — Z0001 Encounter for general adult medical examination with abnormal findings: Secondary | ICD-10-CM | POA: Diagnosis not present

## 2020-10-28 DIAGNOSIS — E039 Hypothyroidism, unspecified: Secondary | ICD-10-CM | POA: Diagnosis not present

## 2020-10-28 DIAGNOSIS — Z1329 Encounter for screening for other suspected endocrine disorder: Secondary | ICD-10-CM | POA: Diagnosis not present

## 2020-10-28 DIAGNOSIS — D509 Iron deficiency anemia, unspecified: Secondary | ICD-10-CM | POA: Diagnosis not present

## 2020-10-28 DIAGNOSIS — Z13228 Encounter for screening for other metabolic disorders: Secondary | ICD-10-CM | POA: Diagnosis not present

## 2020-10-28 DIAGNOSIS — Z130 Encounter for screening for diseases of the blood and blood-forming organs and certain disorders involving the immune mechanism: Secondary | ICD-10-CM | POA: Diagnosis not present

## 2020-10-28 DIAGNOSIS — N959 Unspecified menopausal and perimenopausal disorder: Secondary | ICD-10-CM | POA: Diagnosis not present

## 2020-10-28 DIAGNOSIS — N951 Menopausal and female climacteric states: Secondary | ICD-10-CM | POA: Diagnosis not present

## 2020-10-28 MED ORDER — WEGOVY 1.7 MG/0.75ML ~~LOC~~ SOAJ
0.7500 mL | SUBCUTANEOUS | 3 refills | Status: DC
  Filled 2020-10-28: qty 3, 28d supply, fill #0
  Filled 2020-11-24 – 2020-12-01 (×3): qty 3, 28d supply, fill #1
  Filled 2020-12-25: qty 3, 28d supply, fill #2
  Filled 2021-01-16: qty 3, 28d supply, fill #3

## 2020-10-31 NOTE — Telephone Encounter (Signed)
Pt scheduled  

## 2020-11-01 ENCOUNTER — Other Ambulatory Visit (HOSPITAL_COMMUNITY): Payer: Self-pay

## 2020-11-01 ENCOUNTER — Ambulatory Visit: Payer: Self-pay

## 2020-11-01 ENCOUNTER — Ambulatory Visit (INDEPENDENT_AMBULATORY_CARE_PROVIDER_SITE_OTHER): Payer: 59 | Admitting: Family Medicine

## 2020-11-01 ENCOUNTER — Ambulatory Visit (INDEPENDENT_AMBULATORY_CARE_PROVIDER_SITE_OTHER): Payer: 59

## 2020-11-01 ENCOUNTER — Other Ambulatory Visit: Payer: Self-pay

## 2020-11-01 VITALS — BP 108/76 | HR 77 | Ht 65.0 in | Wt 134.0 lb

## 2020-11-01 DIAGNOSIS — M25561 Pain in right knee: Secondary | ICD-10-CM | POA: Diagnosis not present

## 2020-11-01 DIAGNOSIS — S83241D Other tear of medial meniscus, current injury, right knee, subsequent encounter: Secondary | ICD-10-CM

## 2020-11-01 MED ORDER — PREDNISONE 20 MG PO TABS
20.0000 mg | ORAL_TABLET | Freq: Two times a day (BID) | ORAL | 0 refills | Status: DC
  Filled 2020-11-01: qty 10, 5d supply, fill #0

## 2020-11-01 MED ORDER — FLUCONAZOLE 200 MG PO TABS
200.0000 mg | ORAL_TABLET | Freq: Every day | ORAL | 0 refills | Status: DC
  Filled 2020-11-01: qty 2, 2d supply, fill #0

## 2020-11-01 NOTE — Patient Instructions (Addendum)
R knee xray today Prednisone '40mg'$  for 5 days Take Diflucan today and then in 4 days See me as scheduled

## 2020-11-01 NOTE — Progress Notes (Signed)
[place] Phone: [phone] Subjective:   [name], am serving as a scribe for Dr. Hulan Saas.  This visit occurred during the SARS-CoV-2 public health emergency.  Safety protocols were in place, including screening questions prior to the visit, additional usage of staff PPE, and extensive cleaning of exam room while observing appropriate contact time as indicated for disinfecting solutions.   I'm seeing this patient by the request  of:  Patient, No Pcp Per (Inactive)  CC: Knee pain  [id number]  Martha [name] is a 40 y.o. female coming in with complaint of R knee pain. Felt a pop multiple times over past few days. Is wearing brace today for support.  Patient has an MRI from 2020 showing medial meniscal mild tearing as well as some degenerative stage IV chondromalacia.  Patient states that just started having audible popping.  Sometimes the popping hurts and sometimes it does not.  Patient has hit it against a couple things.  Denies though any instability.      Past Medical History:  Diagnosis Date   Asthma    Lichen planus    MS (multiple sclerosis) (HCC)    PCOS (polycystic ovarian syndrome)    Raynaud's disease    Past Surgical History:  Procedure Laterality Date   colonoscopy     DILATION AND CURETTAGE OF UTERUS     x 3   ESOPHAGOGASTRODUODENOSCOPY     TONSILLECTOMY AND ADENOIDECTOMY     Social History   Socioeconomic History   Marital status: Married    Spouse name: Not on file   Number of children: 0   Years of education: college   Highest education level: Professional school degree (e.g., MD, DDS, DVM, JD)  Occupational History   Occupation: physician  Tobacco Use   Smoking status: Never   Smokeless tobacco: Never  Vaping Use   Vaping Use: Never used  Substance and Sexual Activity   Alcohol use: No   Drug use: No   Sexual activity: Not on file  Other Topics Concern    Not on file  Social History Narrative   Right handed   1-2 cups caffeine daily   Lives at home with husband   Social Determinants of Health   Financial Resource Strain: Not on file  Food Insecurity: Not on file  Transportation Needs: Not on file  Physical Activity: Not on file  Stress: Not on file  Social Connections: Not on file   No Known Allergies Family History  Problem Relation Age of Onset   Diabetes Mother    Thyroid disease Mother    Hypertension Mother    Hypercholesterolemia Mother    Migraines Mother    Depression Mother    Diabetes Father    Thyroid disease Father    CAD Father    Hypercholesterolemia Father    Hypertension Father    Depression Father    Pernicious anemia Father    Breast cancer Paternal Aunt     Current Outpatient Medications (Endocrine & Metabolic):    predniSONE (DELTASONE) 20 MG tablet, Take 1 tablet (20 mg total) by mouth 2 (two) times daily.  Current Outpatient Medications (Cardiovascular):    diltiazem (CARDIZEM) 30 MG tablet, TAKE 1 TABLET (30 MG TOTAL) BY MOUTH 4 (FOUR) TIMES DAILY AS NEEDED (FOR PALPITATIONS/ELEVATED HEART RATES).  Current Outpatient Medications (Respiratory):    promethazine (PHENERGAN) 12.5 MG tablet, Take 1-2 tablets (12.5-25 mg total) by mouth every  6 (six) hours as needed.   promethazine (PHENERGAN) 25 MG tablet, Take 1 tablet (25 mg total) by mouth every 6 (six) hours as needed for nausea or vomiting.  Current Outpatient Medications (Analgesics):    aspirin EC 81 MG tablet, Take 81 mg by mouth daily.   rizatriptan (MAXALT) 5 MG tablet, TAKE 1 TABLET (5 MG TOTAL) BY MOUTH AS NEEDED FOR MIGRAINE. MAY REPEAT IN 2 HOURS IF NEEDED   Current Outpatient Medications (Other):    fluconazole (DIFLUCAN) 200 MG tablet, Take 1 tablet (200 mg total) by mouth daily.   Ascorbic Acid (VITAMIN C) 1000 MG tablet, Take 1 tablet by mouth as needed.   CALCIUM PO, Take 500 mg by mouth daily. +Vit D3 (18mg)   Cholecalciferol  (VITAMIN D) 125 MCG (5000 UT) CAPS, Take 1 capsule by mouth once a week.   cyclobenzaprine (FLEXERIL) 5 MG tablet, TAKE 1 TABLET (5 MG TOTAL) BY MOUTH 3 (THREE) TIMES DAILY AS NEEDED FOR MUSCLE SPASMS.   Diroximel Fumarate (VUMERITY) 231 MG CPDR, Take 462 mg by mouth in the morning and at bedtime.   esomeprazole (NEXIUM) 40 MG capsule, TAKE 1 CAPSULE BY MOUTH TWICE DAILY 30 MINUTES BEFORE BREAKFAST AND DINNER.   ketoconazole (NIZORAL) 2 % shampoo, APPLY 5ML'S EXTERNALLY 2 TO 3 TIMES PER WEEK (Patient not taking: Reported on 07/11/2020)   MAGNESIUM PO, Take 200 mg by mouth daily.   Multiple Vitamin (MULTIVITAMIN ADULT PO), Take 1 tablet by mouth daily in the afternoon.   ondansetron (ZOFRAN) 4 MG tablet, Take 1 tablet (4 mg total) by mouth 2 (two) times daily as needed.   Semaglutide-Weight Management (WEGOVY) 1.7 MG/0.75ML SOAJ, Inject 1.7 mg into the skin once a week.   Semaglutide-Weight Management (WEGOVY) 2.4 MG/0.75ML SOAJ, Inject 2.4 mg into the skin once a week. (Patient not taking: Reported on 07/11/2020)   Zinc 50 MG TABS, Take 1 tablet by mouth as needed.   zolpidem (AMBIEN) 10 MG tablet, TAKE 1/2 TO 1 TABLET BY MOUTH AT BEDTIME   Reviewed prior external information including notes and imaging from  primary care provider As well as notes that were available from care everywhere and other healthcare systems.  Past medical history, social, surgical and family history all reviewed in electronic medical record.  No pertanent information unless stated regarding to the chief complaint.   Review of Systems:  No headache, visual changes, nausea, vomiting, diarrhea, constipation, dizziness, abdominal pain, skin rash, fevers, chills, night sweats, weight loss, swollen lymph nodes, body aches, joint swelling, chest pain, shortness of breath, mood changes. POSITIVE muscle aches  Objective  Blood pressure 108/76, pulse 77, height '5\' 5"'$  (1.651 m), weight 134 lb (60.8 kg), SpO2 99 %.   General: No  apparent distress alert and oriented x3 mood and affect normal, dressed appropriately.  HEENT: Pupils equal, extraocular movements intact  Respiratory: Patient's speak in full sentences and does not appear short of breath  Cardiovascular: No lower extremity edema, non tender, no erythema  Gait normal with good balance and coordination.  MSK: Low back exam does show some mild loss of lordosis.  Some tenderness to palpation.  Patient noticing right knee trace fluid compared to contralateral knee.  No significant limited range of motion.  No instability.  Patient is minorly tender over the tibial plateau area on the medial aspect.  Negative McMurray's.    Impression and Recommendations:     The above documentation has been reviewed and is accurate and complete Martha Pulley  DO

## 2020-11-01 NOTE — Assessment & Plan Note (Addendum)
Patient has had a medial meniscal tear previously.  Discussed with patient about being home exercises.  Also known to have degenerative arthritic changes of the right knee.  Patient will be on prednisone with patient having other issues as well for the pain.  Problem.  Follow-up with as scheduled for regular appointment for her back  Did give patient Diflucan secondary to the prednisone.

## 2020-11-08 DIAGNOSIS — N907 Vulvar cyst: Secondary | ICD-10-CM | POA: Diagnosis not present

## 2020-11-10 ENCOUNTER — Encounter: Payer: Self-pay | Admitting: Neurology

## 2020-11-10 ENCOUNTER — Other Ambulatory Visit: Payer: Self-pay

## 2020-11-10 ENCOUNTER — Ambulatory Visit (INDEPENDENT_AMBULATORY_CARE_PROVIDER_SITE_OTHER): Payer: 59 | Admitting: Neurology

## 2020-11-10 VITALS — BP 108/74 | HR 72 | Ht 65.0 in | Wt 136.0 lb

## 2020-11-10 DIAGNOSIS — Z79899 Other long term (current) drug therapy: Secondary | ICD-10-CM

## 2020-11-10 DIAGNOSIS — R208 Other disturbances of skin sensation: Secondary | ICD-10-CM

## 2020-11-10 DIAGNOSIS — G35 Multiple sclerosis: Secondary | ICD-10-CM

## 2020-11-10 DIAGNOSIS — L439 Lichen planus, unspecified: Secondary | ICD-10-CM

## 2020-11-10 DIAGNOSIS — G43001 Migraine without aura, not intractable, with status migrainosus: Secondary | ICD-10-CM

## 2020-11-10 NOTE — Progress Notes (Signed)
[facility]   PATIENT: Martha [name] DOB: January 16, 1981  REFERRING DOCTOR OR PCP:  [name] [facility] Neurology) SOURCE: Patient, notes from neurology, laboratory reports, imaging reports, MRI images personally reviewed.  _________________________________   HISTORICAL  CHIEF COMPLAINT:  Chief Complaint  Patient presents with   Follow-up    Rm 1 alone here for 6 month f/u . Pt reports she has been doing well. On Vumerity reports she is doing well on this. Reports 1-6 headaches a month. She typically will wake up with a h/a. Excedrin helps and she will take the sumatriptan but will make her groggy the rest of the day.     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 woman with multiple sclerosis.  Update 11/10/2020:  She is on Vumerity and tolerates it well.   She is now tolerating it well though swallowing pills is difficulty.    She had mild flushing but no GI upset.   She flushes if takes without food.     Gait is good.   Balance is fine on flat surface.  She holds the bannister on stairs and had one fall on stairs but is doing well   She is not experiencing the squeezing thoracic and limb dysesthesias.  She gets othe altered sensation experiences like water running down calves..   Vision is better than 20/20.   ON was her first exacerbatin in 2009     Fatigue is doing ok.  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   Vision is fine.  Bladder is fine.   She had ON near the time of diagnosis in 2009.    She is having migraines with a recent bad one after a flight.   She takes Excedrin migraine initially if one occurs.  and had two in a row 2 weeks ago.    Maxalt helped and is tolerated ok but makes her sleepy.   Naratriptan caused nausea and did not reduce the migraine..     She has lost 70 pounds on purpose, helped by semaglutide and diet.   She has sleep  maintenance insomnia.   Ambien helps sleep onset but she wakes up 4 hours later and then has trouble falling back asleep.     MS History She was diagnosed with MS in 2009 after presenting with right optic neuritis.  She initially saw ophthalmology and was referred to neuro-ophthalmology (Dr. [name]).  After an MRI showed changes consistent with MS, she was referred to Dr. [name], at [facility] neurology.     She was placed on Avonex and tolerated it well.   Due to LFT elevation, she switched to Copaxone.    She had tolerability issues and switched to Tecfidera in 2013.  She had some mild liver elevation.  Her MS was stable on all of these medications.  She stopped Tecfidera in 2017 due to family planning issues.    She has not been able to get pregnant and is ready to restart therapy.  She started Vumerity August 2021.  She has low vitamin D and takes supplements weekly.       Imaging:  MRI of the brain 12/05/2018 showed multiple T2/FLAIR hyperintense foci, predominantly in the periventricular white matter radially oriented to the ventricles.  A couple foci were also noted in the juxtacortical white matter, possibly a couple punctate foci in the pons, deep white  matter and one focus in the right cerebellar hemisphere.  None of the foci enhanced.  There were no films for comparison.    She reports a cervical spine MRI in 2013 did not show any plaque.      MRI lumbar in 2015 showed mild facet hypertrophy at a few levels.     MRI brain 11/03/2019 showed multiple T2/FLAIR hyperintense foci in the periventricular, juxtacortical and deep white matter of both hemispheres consistent with demyelinating plaque associated with multiple sclerosis. Most of the foci were present on the 12/05/2018 MRI. However, there is one new juxtacortical focus in the right hemisphere. That focus also enhances after contrast implying a more acute or subacute timeframe.  MRI cervical spine 11/03/2019 showed a normal spinal cord and no  significant DJD.  OTHER: She had Lichen Planus on the left arm and has a sore in her mouth potentially the same diagnosis.  She is going to see an oral surgeon for possible biopsy.   REVIEW OF SYSTEMS: Constitutional: No fevers, chills, sweats, or change in appetite Eyes: No visual changes, double vision, eye pain Ear, nose and throat: No hearing loss, ear pain, nasal congestion, sore throat.  She currently has a sore in her mouth potentially worrisome for lichen planus Cardiovascular: No chest pain, palpitations Respiratory:  No shortness of breath at rest or with exertion.   No wheezes GastrointestinaI: No nausea, vomiting, diarrhea, abdominal pain, fecal incontinence Genitourinary:  No dysuria, urinary retention or frequency.  No nocturia. Musculoskeletal:  No neck pain.  She has some back pain. Integumentary: No rash, pruritus.  She had lichen planus in the left arm Neurological: as above Psychiatric: No depression at this time.  No anxiety Endocrine: No palpitations, diaphoresis, change in appetite, change in weigh or increased thirst Hematologic/Lymphatic:  No anemia, purpura, petechiae. Allergic/Immunologic: No itchy/runny eyes, nasal congestion, recent allergic reactions, rashes  ALLERGIES: No Known Allergies  HOME MEDICATIONS:  Current Outpatient Medications:    Ascorbic Acid (VITAMIN C) 1000 MG tablet, Take 1 tablet by mouth as needed., Disp: , Rfl:    aspirin EC 81 MG tablet, Take 81 mg by mouth daily., Disp: , Rfl:    CALCIUM PO, Take 500 mg by mouth daily. +Vit D3 (65mg), Disp: , Rfl:    Cholecalciferol (VITAMIN D) 125 MCG (5000 UT) CAPS, Take 1 capsule by mouth once a week., Disp: , Rfl:    cyclobenzaprine (FLEXERIL) 5 MG tablet, TAKE 1 TABLET (5 MG TOTAL) BY MOUTH 3 (THREE) TIMES DAILY AS NEEDED FOR MUSCLE SPASMS., Disp: 60 tablet, Rfl: 1   diltiazem (CARDIZEM) 30 MG tablet, TAKE 1 TABLET (30 MG TOTAL) BY MOUTH 4 (FOUR) TIMES DAILY AS NEEDED (FOR PALPITATIONS/ELEVATED  HEART RATES)., Disp: 30 tablet, Rfl: 2   Diroximel Fumarate (VUMERITY) 231 MG CPDR, Take 462 mg by mouth in the morning and at bedtime., Disp: 120 capsule, Rfl: 11   esomeprazole (NEXIUM) 40 MG capsule, TAKE 1 CAPSULE BY MOUTH TWICE DAILY 30 MINUTES BEFORE BREAKFAST AND DINNER., Disp: 60 capsule, Rfl: 11   fluconazole (DIFLUCAN) 200 MG tablet, Take 1 tablet (200 mg total) by mouth daily., Disp: 2 tablet, Rfl: 0   MAGNESIUM PO, Take 200 mg by mouth daily., Disp: , Rfl:    Multiple Vitamin (MULTIVITAMIN ADULT PO), Take 1 tablet by mouth daily in the afternoon., Disp: , Rfl:    ondansetron (ZOFRAN) 4 MG tablet, Take 1 tablet (4 mg total) by mouth 2 (two) times daily as needed., Disp: 60 tablet, Rfl:  3   rizatriptan (MAXALT) 5 MG tablet, TAKE 1 TABLET (5 MG TOTAL) BY MOUTH AS NEEDED FOR MIGRAINE. MAY REPEAT IN 2 HOURS IF NEEDED, Disp: 10 tablet, Rfl: 3   Semaglutide-Weight Management (WEGOVY) 1.7 MG/0.75ML SOAJ, Inject 1.7 mg into the skin once a week., Disp: 3 mL, Rfl: 3   Zinc 50 MG TABS, Take 1 tablet by mouth as needed., Disp: , Rfl:    zolpidem (AMBIEN) 10 MG tablet, TAKE 1/2 TO 1 TABLET BY MOUTH AT BEDTIME, Disp: 30 tablet, Rfl: 5  PAST MEDICAL HISTORY: Past Medical History:  Diagnosis Date   Asthma    Lichen planus    MS (multiple sclerosis) (HCC)    PCOS (polycystic ovarian syndrome)    Raynaud's disease     PAST SURGICAL HISTORY: Past Surgical History:  Procedure Laterality Date   colonoscopy     DILATION AND CURETTAGE OF UTERUS     x 3   ESOPHAGOGASTRODUODENOSCOPY     TONSILLECTOMY AND ADENOIDECTOMY      FAMILY HISTORY: Family History  Problem Relation Age of Onset   Diabetes Mother    Thyroid disease Mother    Hypertension Mother    Hypercholesterolemia Mother    Migraines Mother    Depression Mother    Diabetes Father    Thyroid disease Father    CAD Father    Hypercholesterolemia Father    Hypertension Father    Depression Father    Pernicious anemia Father     Breast cancer Paternal Aunt     SOCIAL HISTORY:  Social History   Socioeconomic History   Marital status: Married    Spouse name: Not on file   Number of children: 0   Years of education: college   Highest education level: Professional school degree (e.g., MD, DDS, DVM, JD)  Occupational History   Occupation: physician  Tobacco Use   Smoking status: Never   Smokeless tobacco: Never  Vaping Use   Vaping Use: Never used  Substance and Sexual Activity   Alcohol use: No   Drug use: No   Sexual activity: Not on file  Other Topics Concern   Not on file  Social History Narrative   Right handed   1-2 cups caffeine daily   Lives at home with husband   Social Determinants of Health   Financial Resource Strain: Not on file  Food Insecurity: Not on file  Transportation Needs: Not on file  Physical Activity: Not on file  Stress: Not on file  Social Connections: Not on file  Intimate Partner Violence: Not on file     PHYSICAL EXAM  Vitals:   11/10/20 0837  BP: 108/74  Pulse: 72  SpO2: 99%  Weight: 136 lb (61.7 kg)  Height: '5\' 5"'$  (1.651 m)    Body mass index is 22.63 kg/m.   General: The patient is well-developed and well-nourished and in no acute distress  HEENT:  Head is [place]/AT.  Sclera are anicteric.    Skin: Extremities are without rash or  edema.  Neurologic Exam  Mental status: The patient is alert and oriented x 3 at the time of the examination. The patient has apparent normal recent and remote memory, with an apparently normal attention span and concentration ability.   Speech is normal.  Cranial nerves: Extraocular movements are full.    Facial strength is symmetric.  No obvious hearing deficits are noted.  Motor:  Muscle bulk is normal.   Tone is normal. Strength is  5 /  5 in all 4 extremities.   Sensory: Sensory testing is intact to pinprick, soft touch and vibration sensation in all 4 extremities.  Normal temperature sensation in the  trunk  Coordination: Cerebellar testing reveals good finger-nose-finger and heel-to-shin bilaterally.  Gait and station: Station is normal.   Gait is normal.  Her tandem gait is minimally wide.  .  Romberg is normal..   Reflexes: Deep tendon reflexes are symmetric and normal bilaterally.        DIAGNOSTIC DATA (LABS, IMAGING, TESTING) - I reviewed patient records, labs, notes, testing and imaging myself where available.  Lab Results  Component Value Date   WBC 10.3 10/12/2019   HGB 11.9 (L) 10/12/2019   HCT 38.5 10/12/2019   MCV 73.6 (L) 10/12/2019   PLT 294 10/12/2019      Component Value Date/Time   NA 138 10/12/2019 0005   K 3.6 10/12/2019 0005   CL 106 10/12/2019 0005   CO2 21 (L) 10/12/2019 0005   GLUCOSE 125 (H) 10/12/2019 0005   BUN 10 10/12/2019 0005   CREATININE 0.84 10/12/2019 0005   CALCIUM 8.6 (L) 10/12/2019 0005   PROT 6.2 (L) 10/12/2019 0005   ALBUMIN 3.6 10/12/2019 0005   AST 21 10/12/2019 0005   ALT 30 10/12/2019 0005   ALKPHOS 41 10/12/2019 0005   BILITOT 0.4 10/12/2019 0005   GFRNONAA >60 10/12/2019 0005   GFRAA >60 10/12/2019 0005       ASSESSMENT AND PLAN  Multiple sclerosis (HCC)  Migraine without aura and with status migrainosus, not intractable  High risk medication use  Lichen planus  Dysesthesia   1.  Continue Vumerity.  She has recent blood work and will forwar results to [place].    2.  If migraines worsen consider prophylactic agent.   3.   continue Ambien as needed.   Try Flexeril 5 mg nightly for sleep maintenance and to increase SWS.   If no improvement can change Ambien to CR 12.5 mg 4.   Return to see me in 6 months or sooner for new or worsening neurologic symptoms.   Meleny Tregoning A. Felecia Shelling, MD, [facility] [id number], 0000000 AM Certified in Neurology, Clinical Neurophysiology, Sleep Medicine and Neuroimaging  [facility] [address] [place], [place] [address] [phone]

## 2020-11-14 MED FILL — Cyclobenzaprine HCl Tab 5 MG: ORAL | 20 days supply | Qty: 60 | Fill #0 | Status: AC

## 2020-11-15 ENCOUNTER — Other Ambulatory Visit (HOSPITAL_COMMUNITY): Payer: Self-pay

## 2020-11-25 ENCOUNTER — Other Ambulatory Visit (HOSPITAL_COMMUNITY): Payer: Self-pay

## 2020-12-01 ENCOUNTER — Other Ambulatory Visit (HOSPITAL_COMMUNITY): Payer: Self-pay

## 2020-12-07 NOTE — Progress Notes (Signed)
Martha [name] Martha [name] [address] [place] Phone: [phone] Subjective:   Martha [name], am serving as a scribe for Dr. Hulan Saas. This visit occurred during the SARS-CoV-2 public health emergency.  Safety protocols were in place, including screening questions prior to the visit, additional usage of staff PPE, and extensive cleaning of exam room while observing appropriate contact time as indicated for disinfecting solutions.   I'm seeing this patient by the request  of:  Patient, No Pcp Per (Inactive)  CC: Knee pain follow-up, back pain  [id number]  11/01/2020 Patient has had a medial meniscal tear previously.  Discussed with patient about being home exercises.  Also known to have degenerative arthritic changes of the right knee.  Patient will be on prednisone with patient having other issues as well for the pain.  Problem.  Follow-up with as scheduled for regular appointment for her back  Did give patient Diflucan secondary to the prednisone.  Update 12/08/2020 Martha [name] is a 40 y.o. female coming in with complaint of back and neck pain. OMT 11/01/2020.  (-) knee xray last visit. Patient states back and neck pain remain the same. No new complaints.  Patient has been doing relatively well.  He does do a lot more sitting with work and thinks that this contributes to a certain degree.          Reviewed prior external information including notes and imaging from previsou exam, outside providers and external EMR if available.  Reviewed patient's most recent handout for her multiple sclerosis with neurology.  As well as notes that were available from care everywhere and other healthcare systems.  Past medical history, social, surgical and family history all reviewed in electronic medical record.  No pertanent information unless stated regarding to the chief complaint.   Past Medical History:  Diagnosis Date   Asthma    Lichen planus     MS (multiple sclerosis) (HCC)    PCOS (polycystic ovarian syndrome)    Raynaud's disease     No Known Allergies   Review of Systems:  No headache, visual changes, nausea, vomiting, diarrhea, constipation, dizziness, abdominal pain, skin rash, fevers, chills, night sweats, weight loss, swollen lymph nodes, body aches, joint swelling, chest pain, shortness of breath, mood changes. POSITIVE muscle aches  Objective  Blood pressure 108/70, pulse (!) 59, height 5\' 5"  (1.651 m), weight 133 lb (60.3 kg).   General: No apparent distress alert and oriented x3 mood and affect normal, dressed appropriately.  Patient has lost considerable amount of weight and seems to be doing very well. HEENT: Pupils equal, extraocular movements intact  Respiratory: Patient's speak in full sentences and does not appear short of breath  Cardiovascular: No lower extremity edema, non tender, no erythema    Osteopathic findings  C2 flexed rotated and side bent right C7 flexed rotated and side bent left T3 extended rotated and side bent right inhaled rib T6 extended rotated and side bent left L2 flexed rotated and side bent right Sacrum right on right       Assessment and Plan:  Thoracic back pain Multifactorial.  He does think that some of the pain is secondary to how she is sitting in the stress as well.  Patient though does not show any significant weakness.  Has enough relaxer for breakthrough pain.  Discussed icing regimen and home exercises.  Follow-up with me again in 4 to 8 weeks.   Nonallopathic problems  Decision today to  treat with OMT was based on Physical Exam  After verbal consent patient was treated with HVLA, ME, FPR techniques in cervical, rib, thoracic, lumbar, and sacral  areas  Patient tolerated the procedure well with improvement in symptoms  Patient given exercises, stretches and lifestyle modifications  See medications in patient instructions if given  Patient will follow up  in 4-8 weeks      The above documentation has been reviewed and is accurate and complete Martha Pulley, DO        Note: This dictation was prepared with Dragon dictation along with smaller phrase technology. Any transcriptional errors that result from this process are unintentional.

## 2020-12-08 ENCOUNTER — Ambulatory Visit: Payer: Self-pay

## 2020-12-08 ENCOUNTER — Encounter: Payer: Self-pay | Admitting: Family Medicine

## 2020-12-08 ENCOUNTER — Ambulatory Visit (INDEPENDENT_AMBULATORY_CARE_PROVIDER_SITE_OTHER): Payer: 59 | Admitting: Family Medicine

## 2020-12-08 ENCOUNTER — Other Ambulatory Visit: Payer: Self-pay

## 2020-12-08 VITALS — BP 108/70 | HR 59 | Ht 65.0 in | Wt 133.0 lb

## 2020-12-08 DIAGNOSIS — M9903 Segmental and somatic dysfunction of lumbar region: Secondary | ICD-10-CM | POA: Diagnosis not present

## 2020-12-08 DIAGNOSIS — M9908 Segmental and somatic dysfunction of rib cage: Secondary | ICD-10-CM | POA: Diagnosis not present

## 2020-12-08 DIAGNOSIS — M9902 Segmental and somatic dysfunction of thoracic region: Secondary | ICD-10-CM | POA: Diagnosis not present

## 2020-12-08 DIAGNOSIS — M9901 Segmental and somatic dysfunction of cervical region: Secondary | ICD-10-CM | POA: Diagnosis not present

## 2020-12-08 DIAGNOSIS — M9904 Segmental and somatic dysfunction of sacral region: Secondary | ICD-10-CM

## 2020-12-08 DIAGNOSIS — M546 Pain in thoracic spine: Secondary | ICD-10-CM

## 2020-12-08 DIAGNOSIS — G8929 Other chronic pain: Secondary | ICD-10-CM | POA: Diagnosis not present

## 2020-12-08 DIAGNOSIS — M1711 Unilateral primary osteoarthritis, right knee: Secondary | ICD-10-CM

## 2020-12-08 NOTE — Assessment & Plan Note (Signed)
Multifactorial.  He does think that some of the pain is secondary to how she is sitting in the stress as well.  Patient though does not show any significant weakness.  Has enough relaxer for breakthrough pain.  Discussed icing regimen and home exercises.  Follow-up with me again in 4 to 8 weeks.

## 2020-12-08 NOTE — Patient Instructions (Signed)
Good to see you   

## 2020-12-09 ENCOUNTER — Other Ambulatory Visit (HOSPITAL_COMMUNITY): Payer: Self-pay

## 2020-12-18 ENCOUNTER — Encounter: Payer: Self-pay | Admitting: Family Medicine

## 2020-12-26 ENCOUNTER — Other Ambulatory Visit (HOSPITAL_COMMUNITY): Payer: Self-pay

## 2021-01-13 DIAGNOSIS — Z760 Encounter for issue of repeat prescription: Secondary | ICD-10-CM | POA: Diagnosis not present

## 2021-01-17 ENCOUNTER — Ambulatory Visit (INDEPENDENT_AMBULATORY_CARE_PROVIDER_SITE_OTHER): Payer: 59 | Admitting: Podiatry

## 2021-01-17 ENCOUNTER — Encounter: Payer: Self-pay | Admitting: Podiatry

## 2021-01-17 ENCOUNTER — Other Ambulatory Visit (HOSPITAL_COMMUNITY): Payer: Self-pay

## 2021-01-17 ENCOUNTER — Other Ambulatory Visit: Payer: Self-pay

## 2021-01-17 DIAGNOSIS — L439 Lichen planus, unspecified: Secondary | ICD-10-CM | POA: Diagnosis not present

## 2021-01-17 DIAGNOSIS — L601 Onycholysis: Secondary | ICD-10-CM

## 2021-01-17 NOTE — Progress Notes (Signed)
  Subjective:  Patient ID: Martha [name], female    DOB: 12/04/1980,  MRN: [id number]  Chief Complaint  Patient presents with   Nail Problem     NP Great toe - nail coming off     40 y.o. female presents with the above complaint. History confirmed with patient.  Presents for evaluation of loosening and splitting of the nail plate on both sides left worse than right.  Not particular painful.  She does have a history of lichen planus and had a recent lesion on the leg and ankle.  Not on any active medications for this  Objective:  Physical Exam: warm, good capillary refill, no trophic changes or ulcerative lesions, normal DP and PT pulses, normal sensory exam, and bilateral hallux nail with central nail plate onycholysis with proximal clearing. Assessment:   1. Lichen planus with onycholysis      Plan:  Patient was evaluated and treated and all questions answered.  Discussed with her different etiologies of onycholysis including onychomycosis, bacterial infection or traumatic injury or mechanical pressure.  After the visit I did do some research and I do think likely this probably is her lichen planus causing a flare within the nailbed.  Approximately 15 to 25% of patients with lichen planus have nail involvement.  At this point seems to be resolving and clearing proximally.  She is due to see her dermatologist next month she is seeing a new doctor then.  Encouraged her to discuss this with her new dermatologist as well.  We could consider topical steroid use (I am unclear how successful this will be considering penetration of the nail plate), intralesional steroid injection under digital block as well as systemic treatment if not improving but I would prefer a dermatologist specializing in lichen planus to manage this for her.  She will see me again as needed   Return if symptoms worsen or fail to improve.

## 2021-01-19 ENCOUNTER — Ambulatory Visit: Payer: 59 | Admitting: Family Medicine

## 2021-02-13 MED FILL — Esomeprazole Magnesium Cap Delayed Release 40 MG (Base Eq): ORAL | 30 days supply | Qty: 60 | Fill #4 | Status: AC

## 2021-02-14 ENCOUNTER — Other Ambulatory Visit (HOSPITAL_COMMUNITY): Payer: Self-pay

## 2021-02-14 ENCOUNTER — Other Ambulatory Visit: Payer: Self-pay | Admitting: Neurology

## 2021-02-14 ENCOUNTER — Encounter: Payer: Self-pay | Admitting: Neurology

## 2021-02-14 DIAGNOSIS — Z6834 Body mass index (BMI) 34.0-34.9, adult: Secondary | ICD-10-CM | POA: Diagnosis not present

## 2021-02-14 DIAGNOSIS — E785 Hyperlipidemia, unspecified: Secondary | ICD-10-CM | POA: Diagnosis not present

## 2021-02-14 DIAGNOSIS — E669 Obesity, unspecified: Secondary | ICD-10-CM | POA: Diagnosis not present

## 2021-02-14 MED ORDER — WEGOVY 1.7 MG/0.75ML ~~LOC~~ SOAJ
0.7500 mL | SUBCUTANEOUS | 3 refills | Status: DC
  Filled 2021-02-14: qty 3, 28d supply, fill #0
  Filled 2021-03-23: qty 3, 28d supply, fill #1
  Filled 2021-04-28: qty 3, 28d supply, fill #2

## 2021-02-14 MED ORDER — CYCLOBENZAPRINE HCL 5 MG PO TABS
ORAL_TABLET | Freq: Three times a day (TID) | ORAL | 1 refills | Status: DC | PRN
Start: 2021-02-14 — End: 2021-05-15
  Filled 2021-02-14: qty 60, 20d supply, fill #0
  Filled 2021-04-28: qty 60, 20d supply, fill #1

## 2021-02-14 MED ORDER — ZOLPIDEM TARTRATE 10 MG PO TABS
5.0000 mg | ORAL_TABLET | Freq: Every day | ORAL | 5 refills | Status: DC
  Filled 2021-02-14: qty 30, 30d supply, fill #0
  Filled 2021-04-28: qty 30, 30d supply, fill #1
  Filled 2021-07-11: qty 30, 30d supply, fill #2

## 2021-02-16 ENCOUNTER — Other Ambulatory Visit: Payer: Self-pay | Admitting: Neurology

## 2021-02-16 ENCOUNTER — Other Ambulatory Visit (HOSPITAL_COMMUNITY): Payer: Self-pay

## 2021-02-16 MED ORDER — RIZATRIPTAN BENZOATE 5 MG PO TABS
ORAL_TABLET | ORAL | 3 refills | Status: DC
  Filled 2021-02-16: qty 10, 30d supply, fill #0
  Filled 2021-08-30: qty 10, 30d supply, fill #1
  Filled 2021-11-23: qty 18, 30d supply, fill #2

## 2021-02-16 NOTE — Progress Notes (Signed)
[place] Phone: [phone] Subjective:   Martha Martha [name], am serving as a scribe for Dr. Hulan Saas.This visit occurred during the SARS-CoV-2 public health emergency.  Safety protocols were in place, including screening questions prior to the visit, additional usage of staff PPE, and extensive cleaning of exam room while observing appropriate contact time as indicated for disinfecting solutions.  I'm seeing this patient by the request  of:  Patient, Martha [name] (Inactive)  CC: Neck and back pain follow-up  [id number]  Martha Martha [name] is a 40 y.o. female coming in with complaint of back and neck pain. OMT 12/08/2020. Also f/u for R knee pain. Patient states that last night she was having thoracic spine pain but today her back is doing ok.   Medications patient has been prescribed: None  Taking:         Reviewed prior external information including notes and imaging from previsou exam, outside providers and external EMR if available.   As well as notes that were available from care everywhere and other healthcare systems.  Past medical history, social, surgical and family history all reviewed in electronic medical record.  Martha [name] pertanent information unless stated regarding to the chief complaint.   Past Medical History:  Diagnosis Date   Asthma    Lichen planus    MS (multiple sclerosis) (HCC)    PCOS (polycystic ovarian syndrome)    Raynaud's disease     Martha [name] Known Allergies   Review of Systems:  Martha [name] headache, visual changes, nausea, vomiting, diarrhea, constipation, dizziness, abdominal pain, skin rash, fevers, chills, night sweats, weight loss, swollen lymph nodes, body aches, joint swelling, chest pain, shortness of breath, mood changes. POSITIVE muscle aches  Objective  Blood pressure 110/78, height 5\' 5"  (1.651 m), weight 130 lb (59 kg).   General: Martha [name] apparent distress alert and oriented x3  mood and affect normal, dressed appropriately.  HEENT: Pupils equal, extraocular movements intact  Respiratory: Patient's speak in full sentences and does not appear short of breath  Cardiovascular: Martha [name] lower extremity edema, non tender, Martha [name] erythema  Gait normal with good balance and coordination.  MSK: Neck exam does show some mild tightness still noted in the parascapular region.  Seems to be more right greater than left.  Patient has some mild on the left side does well.  Patient's low back does have some very mild tightness but negative Faber negative straight leg test.  Osteopathic findings  T3 extended rotated and side bent right inhaled rib T9 extended rotated and side bent left L2 flexed rotated and side bent right Sacrum right on right       Assessment and Plan:  Thoracic back pain Chronic thoracic back pain.  Discussed with patient in great length.  Still responding extremely well to osteopathic manipulation.  Nothing severe at the moment.  Martha [name] midline tenderness.  Martha [name] radicular symptoms.  Follow-up again in 6 to 8 weeks   Nonallopathic problems  Decision today to treat with OMT was based on Physical Exam  After verbal consent patient was treated with HVLA, ME, FPR techniques in , thoracic, lumbar, and sacral  areas  Patient tolerated the procedure well with improvement in symptoms  Patient given exercises, stretches and lifestyle modifications  See medications in patient instructions if given  Patient will follow up in 4-8 weeks     The above documentation has been reviewed and is accurate and complete Lyndal Pulley, DO  Note: This dictation was prepared with Dragon dictation along with smaller phrase technology. Any transcriptional errors that result from this process are unintentional.

## 2021-02-17 ENCOUNTER — Ambulatory Visit (INDEPENDENT_AMBULATORY_CARE_PROVIDER_SITE_OTHER): Payer: 59 | Admitting: Family Medicine

## 2021-02-17 ENCOUNTER — Other Ambulatory Visit: Payer: Self-pay

## 2021-02-17 VITALS — BP 110/78 | Ht 65.0 in | Wt 130.0 lb

## 2021-02-17 DIAGNOSIS — M9902 Segmental and somatic dysfunction of thoracic region: Secondary | ICD-10-CM | POA: Diagnosis not present

## 2021-02-17 DIAGNOSIS — M9903 Segmental and somatic dysfunction of lumbar region: Secondary | ICD-10-CM | POA: Diagnosis not present

## 2021-02-17 DIAGNOSIS — M9904 Segmental and somatic dysfunction of sacral region: Secondary | ICD-10-CM

## 2021-02-17 DIAGNOSIS — G8929 Other chronic pain: Secondary | ICD-10-CM

## 2021-02-17 DIAGNOSIS — M546 Pain in thoracic spine: Secondary | ICD-10-CM

## 2021-02-17 NOTE — Assessment & Plan Note (Signed)
Chronic thoracic back pain.  Discussed with patient in great length.  Still responding extremely well to osteopathic manipulation.  Nothing severe at the moment.  No midline tenderness.  No radicular symptoms.  Follow-up again in 6 to 8 weeks

## 2021-02-21 DIAGNOSIS — L650 Telogen effluvium: Secondary | ICD-10-CM | POA: Diagnosis not present

## 2021-02-21 DIAGNOSIS — L439 Lichen planus, unspecified: Secondary | ICD-10-CM | POA: Diagnosis not present

## 2021-02-21 DIAGNOSIS — D229 Melanocytic nevi, unspecified: Secondary | ICD-10-CM | POA: Diagnosis not present

## 2021-02-21 DIAGNOSIS — L814 Other melanin hyperpigmentation: Secondary | ICD-10-CM | POA: Diagnosis not present

## 2021-02-21 DIAGNOSIS — I7300 Raynaud's syndrome without gangrene: Secondary | ICD-10-CM | POA: Diagnosis not present

## 2021-02-21 DIAGNOSIS — Z23 Encounter for immunization: Secondary | ICD-10-CM | POA: Diagnosis not present

## 2021-02-21 DIAGNOSIS — L821 Other seborrheic keratosis: Secondary | ICD-10-CM | POA: Diagnosis not present

## 2021-02-21 DIAGNOSIS — L578 Other skin changes due to chronic exposure to nonionizing radiation: Secondary | ICD-10-CM | POA: Diagnosis not present

## 2021-02-21 DIAGNOSIS — D1801 Hemangioma of skin and subcutaneous tissue: Secondary | ICD-10-CM | POA: Diagnosis not present

## 2021-02-28 ENCOUNTER — Other Ambulatory Visit (HOSPITAL_COMMUNITY): Payer: Self-pay

## 2021-02-28 ENCOUNTER — Other Ambulatory Visit: Payer: Self-pay

## 2021-02-28 MED ORDER — AMLODIPINE BESYLATE 5 MG PO TABS
2.5000 mg | ORAL_TABLET | Freq: Every day | ORAL | 0 refills | Status: DC
  Filled 2021-02-28: qty 45, 90d supply, fill #0

## 2021-03-02 DIAGNOSIS — E785 Hyperlipidemia, unspecified: Secondary | ICD-10-CM | POA: Diagnosis not present

## 2021-03-08 ENCOUNTER — Other Ambulatory Visit (HOSPITAL_COMMUNITY): Payer: Self-pay

## 2021-03-24 ENCOUNTER — Other Ambulatory Visit (HOSPITAL_COMMUNITY): Payer: Self-pay

## 2021-03-30 NOTE — Progress Notes (Signed)
Martha [name] [address] [place] Phone: [phone] Subjective:   Martha [name], am serving as a scribe for Dr. Hulan Saas. This visit occurred during the SARS-CoV-2 public health emergency.  Safety protocols were in place, including screening questions prior to the visit, additional usage of staff PPE, and extensive cleaning of exam room while observing appropriate contact time as indicated for disinfecting solutions.   I'm seeing this patient by the request  of:  Patient, No Pcp Per (Inactive)  CC: Back and neck pain follow-up  [id number]  Martha [name] is a 41 y.o. female coming in with complaint of back and neck pain. OMT 02/17/2021. Patient states same as usual. No new complaints.  Continues to have some difficulty with movement to her TMs.  Attempted to try to change the amlodipine.  Difficulty wondering if she can have a prescription.  Medications patient has been prescribed: Amlodopine  Taking:         Reviewed prior external information including notes and imaging from previsou exam, outside providers and external EMR if available.   As well as notes that were available from care everywhere and other healthcare systems.  Past medical history, social, surgical and family history all reviewed in electronic medical record.  No pertanent information unless stated regarding to the chief complaint.   Past Medical History:  Diagnosis Date   Asthma    Lichen planus    MS (multiple sclerosis) (HCC)    PCOS (polycystic ovarian syndrome)    Raynaud's disease     No Known Allergies   Review of Systems:  No headache, visual changes, nausea, vomiting, diarrhea, constipation, abdominal pain, skin rash, fevers, chills, night sweats, weight loss, swollen lymph nodes, body aches, joint swelling, chest pain, shortness of breath, mood changes. POSITIVE muscle aches intermittent dizziness  Objective  Blood pressure 110/74,  height 5\' 5"  (1.651 m), weight 129 lb (58.5 kg).   General: No apparent distress alert and oriented x3 mood and affect normal, dressed appropriately.  HEENT: Pupils equal, extraocular movements intact  Respiratory: Patient's speak in full sentences and does not appear short of breath  Cardiovascular: No lower extremity edema, non tender, no erythema  Continues to have some tenderness to palpation in the cervical thoracic area in the thoracolumbar juncture.  Patient does have tightness in the neck throughout.  Difficulty with sidebending bilaterally.  Patient does have tightness noted in the upper parascapular region right greater than left.  5 out of 5 strength of all the extremities.  Osteopathic findings  C2 flexed rotated and side bent right C6 flexed rotated and side bent left T3 extended rotated and side bent right inhaled rib T9 extended rotated and side bent left L2 flexed rotated and side bent right Sacrum right on right       Assessment and Plan:  Thoracic back pain Chronic problem with mild exacerbation.  Patient has been sitting more likely contributing.  Does have different medications such as the Flexeril that can be used.  We did refill the amlodipine for the potential for the. Syndrome.  Patient does respond relatively well though to osteopathic manipulation.  Follow-up again in 6 weeks    Nonallopathic problems  Decision today to treat with OMT was based on Physical Exam  After verbal consent patient was treated with HVLA, ME, FPR techniques in cervical, rib, thoracic, lumbar, and sacral  areas  Patient tolerated the procedure well with improvement in symptoms  Patient given exercises,  stretches and lifestyle modifications  See medications in patient instructions if given  Patient will follow up in 4-8 weeks     The above documentation has been reviewed and is accurate and complete Martha Pulley, DO        Note: This dictation was prepared with  Dragon dictation along with smaller phrase technology. Any transcriptional errors that result from this process are unintentional.

## 2021-03-31 ENCOUNTER — Ambulatory Visit (INDEPENDENT_AMBULATORY_CARE_PROVIDER_SITE_OTHER): Payer: 59 | Admitting: Family Medicine

## 2021-03-31 ENCOUNTER — Other Ambulatory Visit: Payer: Self-pay

## 2021-03-31 ENCOUNTER — Other Ambulatory Visit (HOSPITAL_COMMUNITY): Payer: Self-pay

## 2021-03-31 VITALS — BP 110/74 | Ht 65.0 in | Wt 129.0 lb

## 2021-03-31 DIAGNOSIS — M9903 Segmental and somatic dysfunction of lumbar region: Secondary | ICD-10-CM

## 2021-03-31 DIAGNOSIS — M9904 Segmental and somatic dysfunction of sacral region: Secondary | ICD-10-CM

## 2021-03-31 DIAGNOSIS — M9908 Segmental and somatic dysfunction of rib cage: Secondary | ICD-10-CM | POA: Diagnosis not present

## 2021-03-31 DIAGNOSIS — M546 Pain in thoracic spine: Secondary | ICD-10-CM

## 2021-03-31 DIAGNOSIS — M9901 Segmental and somatic dysfunction of cervical region: Secondary | ICD-10-CM

## 2021-03-31 DIAGNOSIS — M9902 Segmental and somatic dysfunction of thoracic region: Secondary | ICD-10-CM | POA: Diagnosis not present

## 2021-03-31 DIAGNOSIS — G8929 Other chronic pain: Secondary | ICD-10-CM | POA: Diagnosis not present

## 2021-03-31 MED ORDER — AMLODIPINE BESYLATE 2.5 MG PO TABS
2.5000 mg | ORAL_TABLET | Freq: Every day | ORAL | 0 refills | Status: DC
  Filled 2021-03-31: qty 90, 90d supply, fill #0

## 2021-03-31 NOTE — Assessment & Plan Note (Signed)
Chronic problem with mild exacerbation.  Patient has been sitting more likely contributing.  Does have different medications such as the Flexeril that can be used.  We did refill the amlodipine for the potential for the. Syndrome.  Patient does respond relatively well though to osteopathic manipulation.  Follow-up again in 6 weeks

## 2021-03-31 NOTE — Patient Instructions (Signed)
Niacin but be ready for pink cheeks See you again in 6-8 weeks

## 2021-04-10 DIAGNOSIS — G35 Multiple sclerosis: Secondary | ICD-10-CM | POA: Diagnosis not present

## 2021-04-27 ENCOUNTER — Other Ambulatory Visit (HOSPITAL_BASED_OUTPATIENT_CLINIC_OR_DEPARTMENT_OTHER): Payer: Self-pay

## 2021-04-28 ENCOUNTER — Other Ambulatory Visit (HOSPITAL_COMMUNITY): Payer: Self-pay

## 2021-05-01 ENCOUNTER — Other Ambulatory Visit: Payer: Self-pay | Admitting: *Deleted

## 2021-05-01 DIAGNOSIS — G35 Multiple sclerosis: Secondary | ICD-10-CM

## 2021-05-01 MED ORDER — VUMERITY 231 MG PO CPDR: 462.0000 mg | DELAYED_RELEASE_CAPSULE | Freq: Two times a day (BID) | ORAL | 11 refills | Status: DC

## 2021-05-11 NOTE — Progress Notes (Signed)
?Martha [name] D.O. ?[facility] ?[place] ?Phone: [phone] ?Subjective:   ?I, Martha [name], am serving as a scribe for Dr. Hulan Saas. ? ?This visit occurred during the SARS-CoV-2 public health emergency.  Safety protocols were in place, including screening questions prior to the visit, additional usage of staff PPE, and extensive cleaning of exam room while observing appropriate contact time as indicated for disinfecting solutions.  ? ?I'm seeing this patient by the request  of:  Patient, No Pcp Per (Inactive) ? ?CC: Hand pain and body pain follow-up ? ?[id number]  ?Martha [name] is a 41 y.o. female coming in with complaint of back and neck pain. OMT 03/31/2021. Patient states that one week ago her back started to feel sore. Started on R side but went to L. Feeling better today. Has been using heat. Did not get monthly massage and wonders if this has contributed to soreness last week as she cannot pinpoint an injury.   ? ?Medications patient has been prescribed: Amlodopine ? ?Taking: ? ? ?  ? ? ?Reviewed prior external information including notes and imaging from previsou exam, outside providers and external EMR if available.  ? ?As well as notes that were available from care everywhere and other healthcare systems. ? ?Past medical history, social, surgical and family history all reviewed in electronic medical record.  No pertanent information unless stated regarding to the chief complaint.  ? ?Past Medical History:  ?Diagnosis Date  ? Asthma   ? Lichen planus   ? MS (multiple sclerosis) ([place])   ? PCOS (polycystic ovarian syndrome)   ? Raynaud's disease   ?  ?No Known Allergies ? ? ?Review of Systems: ? No headache, visual changes, nausea, vomiting, diarrhea, constipation, dizziness, abdominal pain, skin rash, fevers, chills, night sweats, weight loss, swollen lymph nodes,  joint swelling, chest pain, shortness of breath, mood changes. POSITIVE muscle aches, body  aches ? ?Objective  ?Blood pressure 98/72, height '5\' 5"'$  (1.651 m), weight 130 lb (59 kg). ?  ?General: No apparent distress alert and oriented x3 mood and affect normal, dressed appropriately.  ?HEENT: Pupils equal, extraocular movements intact  ?Respiratory: Patient's speak in full sentences and does not appear short of breath  ?Cardiovascular: No lower extremity edema, non tender, no erythema  ?Low back exam does have some loss of lordosis.  Patient does have tightness noted in the thoracolumbar junction noted.  Patient's neck exam mild tightness but nothing as severe as what she has had previously.  More discomfort with FABER test. ? ?Osteopathic findings ? ? ?T7 extended rotated and side bent left ?L2 flexed rotated and side bent right ?Sacrum right on right ? ? ? ? ?  ?Assessment and Plan: ? ?Thoracic back pain ?Thoracic back pain.  Does respond well to osteopathic manipulation.  Some mild tightness in the upper lumbar region noted today as well.  Still responding extremely well though to osteopathic manipulation.  Increase activity as tolerated.  Follow-up again in 6 to 8 weeks ?  ? ?Nonallopathic problems ? ?Decision today to treat with OMT was based on Physical Exam ? ?After verbal consent patient was treated with HVLA, ME, FPR techniques in  thoracic, lumbar, and sacral  areas ? ?Patient tolerated the procedure well with improvement in symptoms ? ?Patient given exercises, stretches and lifestyle modifications ? ?See medications in patient instructions if given ? ?Patient will follow up in 4-8 weeks ? ?  ? ? ?The above documentation has been reviewed  and is accurate and complete Lyndal Pulley, DO ? ? ? ?  ? ? Note: This dictation was prepared with Dragon dictation along with smaller phrase technology. Any transcriptional errors that result from this process are unintentional.    ?  ?  ? ?

## 2021-05-12 ENCOUNTER — Other Ambulatory Visit: Payer: Self-pay

## 2021-05-12 ENCOUNTER — Ambulatory Visit (INDEPENDENT_AMBULATORY_CARE_PROVIDER_SITE_OTHER): Payer: 59 | Admitting: Family Medicine

## 2021-05-12 VITALS — BP 98/72 | Ht 65.0 in | Wt 130.0 lb

## 2021-05-12 DIAGNOSIS — M9904 Segmental and somatic dysfunction of sacral region: Secondary | ICD-10-CM

## 2021-05-12 DIAGNOSIS — M9902 Segmental and somatic dysfunction of thoracic region: Secondary | ICD-10-CM | POA: Diagnosis not present

## 2021-05-12 DIAGNOSIS — G8929 Other chronic pain: Secondary | ICD-10-CM

## 2021-05-12 DIAGNOSIS — M546 Pain in thoracic spine: Secondary | ICD-10-CM

## 2021-05-12 DIAGNOSIS — M9903 Segmental and somatic dysfunction of lumbar region: Secondary | ICD-10-CM | POA: Diagnosis not present

## 2021-05-12 NOTE — Assessment & Plan Note (Signed)
Thoracic back pain.  Does respond well to osteopathic manipulation.  Some mild tightness in the upper lumbar region noted today as well.  Still responding extremely well though to osteopathic manipulation.  Increase activity as tolerated.  Follow-up again in 6 to 8 weeks ?

## 2021-05-12 NOTE — Patient Instructions (Signed)
See me again in 6 weeks 

## 2021-05-15 ENCOUNTER — Other Ambulatory Visit (HOSPITAL_COMMUNITY): Payer: Self-pay

## 2021-05-15 ENCOUNTER — Other Ambulatory Visit: Payer: Self-pay

## 2021-05-15 ENCOUNTER — Encounter: Payer: Self-pay | Admitting: Neurology

## 2021-05-15 ENCOUNTER — Ambulatory Visit (INDEPENDENT_AMBULATORY_CARE_PROVIDER_SITE_OTHER): Payer: 59 | Admitting: Neurology

## 2021-05-15 VITALS — BP 106/76 | HR 83 | Ht 65.0 in | Wt 131.5 lb

## 2021-05-15 DIAGNOSIS — G43001 Migraine without aura, not intractable, with status migrainosus: Secondary | ICD-10-CM

## 2021-05-15 DIAGNOSIS — I7300 Raynaud's syndrome without gangrene: Secondary | ICD-10-CM | POA: Diagnosis not present

## 2021-05-15 DIAGNOSIS — G4700 Insomnia, unspecified: Secondary | ICD-10-CM

## 2021-05-15 DIAGNOSIS — R208 Other disturbances of skin sensation: Secondary | ICD-10-CM

## 2021-05-15 DIAGNOSIS — Z79899 Other long term (current) drug therapy: Secondary | ICD-10-CM

## 2021-05-15 DIAGNOSIS — G35 Multiple sclerosis: Secondary | ICD-10-CM | POA: Diagnosis not present

## 2021-05-15 MED ORDER — DOXEPIN HCL 10 MG PO CAPS
10.0000 mg | ORAL_CAPSULE | Freq: Every day | ORAL | 3 refills | Status: DC
  Filled 2021-05-15: qty 90, 90d supply, fill #0
  Filled 2021-08-30: qty 90, 90d supply, fill #1
  Filled 2021-12-09: qty 39, 39d supply, fill #2
  Filled 2021-12-11: qty 51, 51d supply, fill #2
  Filled 2022-03-08: qty 90, 90d supply, fill #3

## 2021-05-15 MED FILL — Esomeprazole Magnesium Cap Delayed Release 40 MG (Base Eq): ORAL | 30 days supply | Qty: 60 | Fill #5 | Status: AC

## 2021-05-15 NOTE — Progress Notes (Signed)
[facility]   PATIENT: Martha [name] DOB: 01-Oct-1980  REFERRING DOCTOR OR PCP:  [name] [facility]) SOURCE: Patient, notes from neurology, laboratory reports, imaging reports, MRI images personally reviewed.  _________________________________   HISTORICAL  CHIEF COMPLAINT:  Chief Complaint  Patient presents with   Follow-up    Rm 1, alone. Here for 6 month MS f/u, on Vumerity and tolerating. Pt overall doing well. No new or worsening in sx.     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 woman with multiple sclerosis.  Update 05/15/2021:  She is on Vumerity and tolerates it well. She flushes if takes without food.   No GI issues  Gait and balance are good on a flat surface.  She had one fall in 2022 on stairs so she holds the bannister.  The squeezing thoracic and limb dysesthesias she had in 2021 resolved.  She still has raremild dysesthesia with a sensation like water running down calves.   Vision is better than 20/20.   ON was her first exacerbatin in 2009     Fatigue is doing ok.  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   Vision is fine.  Bladder is fine.   She had ON near the time of diagnosis in 2009.  She saw Dr. [name] recently and her exam was fine.      She is having migraines often triggered by weather changes and a work.during the day.  She has 3-4 migraines a month.  She takes Excedrin migraine initially if one occurs.     Maxalt helped and is tolerated ok at night but makes her sleepy.   Naratriptan caused nausea and did not reduce the migraine..   She has not tried an oral anti-CGRP agent.     She has sleep onset and maintenance insomnia (usually after 4-5 hours).   Ambien helps sleep onset but she wakes up 4 hours later and then has trouble falling back asleep.    She has lost 70 pounds on purpose, helped by semaglutide and diet.    She has Raynaud's, left > right hand.  She wears heated socks which helps feet.   She has had this for many years but the episodes are more painful recently.  She generally does worse during winter than summer.   MS History She was diagnosed with MS in 2009 after presenting with right optic neuritis.  She initially saw ophthalmology and was referred to neuro-ophthalmology (Dr. [name]).  After an MRI showed changes consistent with MS, she was referred to Dr. [name], at [facility] neurology.     She was placed on Avonex and tolerated it well.   Due to LFT elevation, she switched to Copaxone.    She had tolerability issues and switched to Tecfidera in 2013.  She had some mild liver elevation.  Her MS was stable on all of these medications.  She stopped Tecfidera in 2017 due to family planning issues.    She has not been able to get pregnant and is ready to restart therapy.  She started Vumerity August 2021.  She has low vitamin D and takes supplements weekly.       Imaging:  MRI of the brain 12/05/2018 showed multiple T2/FLAIR hyperintense foci, predominantly in the periventricular white matter radially oriented to the ventricles.  A couple foci were also noted in the juxtacortical white  matter, possibly a couple punctate foci in the pons, deep white matter and one focus in the right cerebellar hemisphere.  None of the foci enhanced.  There were no films for comparison.    She reports a cervical spine MRI in 2013 did not show any plaque.      MRI lumbar in 2015 showed mild facet hypertrophy at a few levels.     MRI brain 11/03/2019 showed multiple T2/FLAIR hyperintense foci in the periventricular, juxtacortical and deep white matter of both hemispheres consistent with demyelinating plaque associated with multiple sclerosis. Most of the foci were present on the 12/05/2018 MRI. However, there is one new juxtacortical focus in the right hemisphere. That focus also enhances after contrast implying a more acute or  subacute timeframe.  MRI cervical spine 11/03/2019 showed a normal spinal cord and no significant DJD.  OTHER: She had Lichen Planus on the left arm and has a sore in her mouth potentially the same diagnosis.  She is going to see an oral surgeon for possible biopsy.   REVIEW OF SYSTEMS: Constitutional: No fevers, chills, sweats, or change in appetite Eyes: No visual changes, double vision, eye pain Ear, nose and throat: No hearing loss, ear pain, nasal congestion, sore throat.  She currently has a sore in her mouth potentially worrisome for lichen planus Cardiovascular: No chest pain, palpitations Respiratory:  No shortness of breath at rest or with exertion.   No wheezes GastrointestinaI: No nausea, vomiting, diarrhea, abdominal pain, fecal incontinence Genitourinary:  No dysuria, urinary retention or frequency.  No nocturia. Musculoskeletal:  No neck pain.  She has some back pain. Integumentary: No rash, pruritus.  She had lichen planus in the left arm Neurological: as above Psychiatric: No depression at this time.  No anxiety Endocrine: No palpitations, diaphoresis, change in appetite, change in weigh or increased thirst Hematologic/Lymphatic:  No anemia, purpura, petechiae. Allergic/Immunologic: No itchy/runny eyes, nasal congestion, recent allergic reactions, rashes  ALLERGIES: No Known Allergies  HOME MEDICATIONS:  Current Outpatient Medications:    amLODipine (NORVASC) 2.5 MG tablet, Take 1 tablet (2.5 mg total) by mouth daily., Disp: 90 tablet, Rfl: 0   cyclobenzaprine (FLEXERIL) 5 MG tablet, TAKE 1 TABLET (5 MG TOTAL) BY MOUTH 3 (THREE) TIMES DAILY AS NEEDED FOR MUSCLE SPASMS., Disp: 60 tablet, Rfl: 1   doxepin (SINEQUAN) 10 MG capsule, Take 1 capsule (10 mg total) by mouth at bedtime., Disp: 90 capsule, Rfl: 3   esomeprazole (NEXIUM) 40 MG capsule, TAKE 1 CAPSULE BY MOUTH TWICE DAILY 30 MINUTES BEFORE BREAKFAST AND DINNER., Disp: 60 capsule, Rfl: 11   ondansetron (ZOFRAN) 4  MG tablet, Take 1 tablet (4 mg total) by mouth 2 (two) times daily as needed., Disp: 60 tablet, Rfl: 3   rizatriptan (MAXALT) 5 MG tablet, TAKE 1 TABLET (5 MG TOTAL) BY MOUTH AS NEEDED FOR MIGRAINE. MAY REPEAT IN 2 HOURS IF NEEDED, Disp: 10 tablet, Rfl: 3   VUMERITY 231 MG CPDR, Take 462 mg by mouth in the morning and at bedtime., Disp: 120 capsule, Rfl: 11   zolpidem (AMBIEN) 10 MG tablet, TAKE 1/2 TO 1 TABLET BY MOUTH AT BEDTIME, Disp: 30 tablet, Rfl: 5   Zinc 50 MG TABS, Take 1 tablet by mouth as needed., Disp: , Rfl:   PAST MEDICAL HISTORY: Past Medical History:  Diagnosis Date   Asthma    Lichen planus    MS (multiple sclerosis) (HCC)    PCOS (polycystic ovarian syndrome)    Raynaud's disease  PAST SURGICAL HISTORY: Past Surgical History:  Procedure Laterality Date   colonoscopy     DILATION AND CURETTAGE OF UTERUS     x 3   ESOPHAGOGASTRODUODENOSCOPY     TONSILLECTOMY AND ADENOIDECTOMY      FAMILY HISTORY: Family History  Problem Relation Age of Onset   Diabetes Mother    Thyroid disease Mother    Hypertension Mother    Hypercholesterolemia Mother    Migraines Mother    Depression Mother    Diabetes Father    Thyroid disease Father    CAD Father    Hypercholesterolemia Father    Hypertension Father    Depression Father    Pernicious anemia Father    Breast cancer Paternal Aunt     SOCIAL HISTORY:  Social History   Socioeconomic History   Marital status: Married    Spouse name: Not on file   Number of children: 0   Years of education: college   Highest education level: Professional school degree (e.g., MD, DDS, DVM, JD)  Occupational History   Occupation: physician  Tobacco Use   Smoking status: Never   Smokeless tobacco: Never  Vaping Use   Vaping Use: Never used  Substance and Sexual Activity   Alcohol use: No   Drug use: No   Sexual activity: Not on file  Other Topics Concern   Not on file  Social History Narrative   Right handed   1-2  cups caffeine daily   Lives at home with husband   Social Determinants of Health   Financial Resource Strain: Not on file  Food Insecurity: Not on file  Transportation Needs: Not on file  Physical Activity: Not on file  Stress: Not on file  Social Connections: Not on file  Intimate Partner Violence: Not on file     PHYSICAL EXAM  Vitals:   05/15/21 0833  BP: 106/76  Pulse: 83  Weight: 131 lb 8 oz (59.6 kg)  Height: '5\' 5"'$  (1.651 m)    Body mass index is 21.88 kg/m.   General: The patient is well-developed and well-nourished and in no acute distress  HEENT:  Head is [place]/AT.  Sclera are anicteric.    Skin: Extremities are without rash or  edema.  Neurologic Exam  Mental status: The patient is alert and oriented x 3 at the time of the examination. The patient has apparent normal recent and remote memory, with an apparently normal attention span and concentration ability.   Speech is normal.  Cranial nerves: Extraocular movements are full.    Color vision is normal and symmetric.  Facial strength is symmetric.  No obvious hearing deficits are noted.  Motor:  Muscle bulk is normal.   Tone is normal. Strength is  5 / 5 in all 4 extremities.   Sensory: Sensory testing is intact to pinprick, soft touch and vibration sensation in all 4 extremities.  Normal temperature sensation in the trunk  Coordination: Cerebellar testing reveals good finger-nose-finger and heel-to-shin bilaterally.  Gait and station: Station is normal.   Gait is normal.  The tandem gait is slightly wide.  Romberg is normal. Reflexes: Deep tendon reflexes are symmetric and normal bilaterally.        DIAGNOSTIC DATA (LABS, IMAGING, TESTING) - I reviewed patient records, labs, notes, testing and imaging myself where available.  Lab Results  Component Value Date   WBC 10.3 10/12/2019   HGB 11.9 (L) 10/12/2019   HCT 38.5 10/12/2019   MCV 73.6 (L) 10/12/2019  PLT 294 10/12/2019      Component Value  Date/Time   NA 138 10/12/2019 0005   K 3.6 10/12/2019 0005   CL 106 10/12/2019 0005   CO2 21 (L) 10/12/2019 0005   GLUCOSE 125 (H) 10/12/2019 0005   BUN 10 10/12/2019 0005   CREATININE 0.84 10/12/2019 0005   CALCIUM 8.6 (L) 10/12/2019 0005   PROT 6.2 (L) 10/12/2019 0005   ALBUMIN 3.6 10/12/2019 0005   AST 21 10/12/2019 0005   ALT 30 10/12/2019 0005   ALKPHOS 41 10/12/2019 0005   BILITOT 0.4 10/12/2019 0005   GFRNONAA >60 10/12/2019 0005   GFRAA >60 10/12/2019 0005       ASSESSMENT AND PLAN  Multiple sclerosis ([place]) - Plan: ANA w/Reflex, Sedimentation rate, C-reactive protein, Cryoglobulin, Antiphospholipid Syndrome Comp, MR BRAIN W WO CONTRAST, CBC with Differential/Platelet  High risk medication use - Plan: ANA w/Reflex, Sedimentation rate, C-reactive protein, Cryoglobulin, Antiphospholipid Syndrome Comp, CBC with Differential/Platelet  Migraine without aura and with status migrainosus, not intractable  Dysesthesia  Insomnia, unspecified type  Raynaud's disease without gangrene   1.  Continue Vumerity.  We will recheck the lymphocyte count.  If it is 0.6 or lower we will need to adjust the dose or consider a different DMT.   2.  Trial of Nurtec for migraines.  She has trouble tolerating rizatriptan and naratriptan.   3.   continue Ambien as needed.   Trial of doxepin for sleep maintenance insomnia.  If no improvement can consider Ambien to CR 12.5 mg 4.   Return to see me in 6 months or sooner for new or worsening neurologic symptoms.   Carmyn Hamm A. Felecia Shelling, MD, [facility] 9/37/9024, 0:97 AM Certified in Neurology, Clinical Neurophysiology, Sleep Medicine and Neuroimaging  [facility] [address], [place], [place] [address] [phone]

## 2021-05-16 ENCOUNTER — Other Ambulatory Visit (HOSPITAL_COMMUNITY)
Admission: AD | Admit: 2021-05-16 | Discharge: 2021-05-16 | Disposition: A | Discharge status: Home / Self Care | Payer: 59 | Source: Ambulatory Visit | Attending: Neurology | Admitting: Neurology

## 2021-05-16 DIAGNOSIS — I7300 Raynaud's syndrome without gangrene: Secondary | ICD-10-CM | POA: Diagnosis not present

## 2021-05-16 LAB — CBC WITH DIFFERENTIAL/PLATELET
Abs Immature Granulocytes: 0.02 10*3/uL (ref 0.00–0.07)
Basophils Absolute: 0 10*3/uL (ref 0.0–0.1)
Basophils Relative: 1 %
Eosinophils Absolute: 0.1 10*3/uL (ref 0.0–0.5)
Eosinophils Relative: 1 %
HCT: 39.8 % (ref 36.0–46.0)
Hemoglobin: 12.5 g/dL (ref 12.0–15.0)
Immature Granulocytes: 1 %
Lymphocytes Relative: 19 %
Lymphs Abs: 0.7 10*3/uL (ref 0.7–4.0)
MCH: 23.1 pg — ABNORMAL LOW (ref 26.0–34.0)
MCHC: 31.4 g/dL (ref 30.0–36.0)
MCV: 73.7 fL — ABNORMAL LOW (ref 80.0–100.0)
Monocytes Absolute: 0.6 10*3/uL (ref 0.1–1.0)
Monocytes Relative: 16 %
Neutro Abs: 2.4 10*3/uL (ref 1.7–7.7)
Neutrophils Relative %: 62 %
Platelets: 262 10*3/uL (ref 150–400)
RBC: 5.4 MIL/uL — ABNORMAL HIGH (ref 3.87–5.11)
RDW: 14.4 % (ref 11.5–15.5)
WBC: 3.8 10*3/uL — ABNORMAL LOW (ref 4.0–10.5)
nRBC: 0 % (ref 0.0–0.2)

## 2021-05-16 LAB — SEDIMENTATION RATE: Sed Rate: 2 mm/hr (ref 0–22)

## 2021-05-16 LAB — C-REACTIVE PROTEIN: CRP: 0.5 mg/dL (ref <1.0)

## 2021-05-17 LAB — ENA+DNA/DS+SJORGEN'S
ENA SM Ab Ser-aCnc: <0.2 AI (ref 0.0–0.9)
Ribonucleic Protein: <0.2 AI (ref 0.0–0.9)
SSA (Ro) (ENA) Antibody, IgG: 2.5 AI — ABNORMAL HIGH (ref 0.0–0.9)
SSB (La) (ENA) Antibody, IgG: <0.2 AI (ref 0.0–0.9)
ds DNA Ab: 1 IU/mL (ref 0–9)

## 2021-05-17 LAB — ANA W/REFLEX: Anti Nuclear Antibody (ANA): POSITIVE — AB

## 2021-05-18 LAB — ANTIPHOSPHOLIPID SYNDROME PROF
Anticardiolipin IgG: <9 GPL U/mL (ref 0–14)
Anticardiolipin IgM: <9 MPL U/mL (ref 0–12)
DRVVT: 29.3 s (ref 0.0–47.0)
PTT Lupus Anticoagulant: 32.2 s (ref 0.0–43.5)

## 2021-05-22 LAB — CRYOGLOBULIN

## 2021-05-23 ENCOUNTER — Telehealth: Payer: Self-pay | Admitting: Neurology

## 2021-05-23 NOTE — Telephone Encounter (Signed)
MR Brain w/wo contrast Dr. Scot Jun UMR auth: [place] Ref # [id number]. Patient is scheduled at [facility] for 06/14/21.  ?

## 2021-05-24 NOTE — Progress Notes (Signed)
?Martha [name] D.O. ?[facility] ?[place] ?Phone: [phone] ?Subjective:   ?I, Martha [name], am serving as a scribe for Dr. Hulan Saas. ? ?This visit occurred during the SARS-CoV-2 public health emergency.  Safety protocols were in place, including screening questions prior to the visit, additional usage of staff PPE, and extensive cleaning of exam room while observing appropriate contact time as indicated for disinfecting solutions.  ? ? ?I'm seeing this patient by the request  of:  Patient, No Pcp Per (Inactive) ? ?CC: right ring finger injury  ? ?[id number]  ?Martha [name] is a 41 y.o. female coming in with complaint of R ring finger injury. Patient states that Sunday she got out of shower and noticed R hand ring finger PIP swelling and pain. Patient has been bracing joint. Has history of this joint dislocating.  ? ? ?  ? ?Past Medical History:  ?Diagnosis Date  ? Asthma   ? Lichen planus   ? MS (multiple sclerosis) ([place])   ? PCOS (polycystic ovarian syndrome)   ? Raynaud's disease   ? ?Past Surgical History:  ?Procedure Laterality Date  ? colonoscopy    ? DILATION AND CURETTAGE OF UTERUS    ? x 3  ? ESOPHAGOGASTRODUODENOSCOPY    ? TONSILLECTOMY AND ADENOIDECTOMY    ? ?Social History  ? ?Socioeconomic History  ? Marital status: Married  ?  Spouse name: Not on file  ? Number of children: 0  ? Years of education: college  ? Highest education level: Professional school degree (e.g., MD, DDS, DVM, JD)  ?Occupational History  ? Occupation: physician  ?Tobacco Use  ? Smoking status: Never  ? Smokeless tobacco: Never  ?Vaping Use  ? Vaping Use: Never used  ?Substance and Sexual Activity  ? Alcohol use: No  ? Drug use: No  ? Sexual activity: Not on file  ?Other Topics Concern  ? Not on file  ?Social History Narrative  ? Right handed  ? 1-2 cups caffeine daily  ? Lives at home with husband  ? ?Social Determinants of Health  ? ?Financial Resource Strain: Not on file   ?Food Insecurity: Not on file  ?Transportation Needs: Not on file  ?Physical Activity: Not on file  ?Stress: Not on file  ?Social Connections: Not on file  ? ?No Known Allergies ?Family History  ?Problem Relation Age of Onset  ? Diabetes Mother   ? Thyroid disease Mother   ? Hypertension Mother   ? Hypercholesterolemia Mother   ? Migraines Mother   ? Depression Mother   ? Diabetes Father   ? Thyroid disease Father   ? CAD Father   ? Hypercholesterolemia Father   ? Hypertension Father   ? Depression Father   ? Pernicious anemia Father   ? Breast cancer Paternal Aunt   ? ? ? ?Current Outpatient Medications (Cardiovascular):  ?  amLODipine (NORVASC) 2.5 MG tablet, Take 1 tablet (2.5 mg total) by mouth daily. ? ? ?Current Outpatient Medications (Analgesics):  ?  rizatriptan (MAXALT) 5 MG tablet, TAKE 1 TABLET (5 MG TOTAL) BY MOUTH AS NEEDED FOR MIGRAINE. MAY REPEAT IN 2 HOURS IF NEEDED ? ? ?Current Outpatient Medications (Other):  ?  doxepin (SINEQUAN) 10 MG capsule, Take 1 capsule (10 mg total) by mouth at bedtime. ?  esomeprazole (NEXIUM) 40 MG capsule, TAKE 1 CAPSULE BY MOUTH TWICE DAILY 30 MINUTES BEFORE BREAKFAST AND DINNER. ?  ondansetron (ZOFRAN) 4 MG tablet, Take 1 tablet (4 mg  total) by mouth 2 (two) times daily as needed. ?  VUMERITY 231 MG CPDR, Take 462 mg by mouth in the morning and at bedtime. ?  zolpidem (AMBIEN) 10 MG tablet, TAKE 1/2 TO 1 TABLET BY MOUTH AT BEDTIME ?  Zinc 50 MG TABS, Take 1 tablet by mouth as needed. ? ? ? ?Objective  ?Blood pressure 114/84, height '5\' 5"'$  (1.651 m), weight 131 lb (59.4 kg). ?  ?General: No apparent distress alert and oriented x3 mood and affect normal, dressed appropriately.  ?HEENT: Pupils equal, extraocular movements intact  ?Respiratory: Patient's speak in full sentences and does not appear short of breath  ?Cardiovascular: No lower extremity edema, non tender, no erythema  ?Gait normal with good balance and coordination.  ?MSK: Patient's right ring finger does have  significant swelling at the PIP noted.  Patient is severely tender to palpation.  With distraction as well as lateral translation audible pop happened and patient improved range of motion immediately.  Neurovascular intact with good capillary refill. ?  ?Impression and Recommendations:  ?  ? ?The above documentation has been reviewed and is accurate and complete Lyndal Pulley, DO ? ? ?

## 2021-05-25 ENCOUNTER — Other Ambulatory Visit: Payer: Self-pay

## 2021-05-25 ENCOUNTER — Ambulatory Visit (INDEPENDENT_AMBULATORY_CARE_PROVIDER_SITE_OTHER): Payer: 59 | Admitting: Family Medicine

## 2021-05-25 ENCOUNTER — Encounter: Payer: Self-pay | Admitting: Family Medicine

## 2021-05-25 ENCOUNTER — Ambulatory Visit: Payer: Self-pay

## 2021-05-25 VITALS — BP 114/84 | Ht 65.0 in | Wt 131.0 lb

## 2021-05-25 DIAGNOSIS — M79641 Pain in right hand: Secondary | ICD-10-CM | POA: Diagnosis not present

## 2021-05-25 DIAGNOSIS — M79644 Pain in right finger(s): Secondary | ICD-10-CM

## 2021-05-25 NOTE — Assessment & Plan Note (Signed)
Finger pain seems to be more of a subluxation of the lateral plate.  Patient has had this previously.  Patient did respond well to manipulation today.  Patient given a frog splint to wear and keep immobilized for the next 24 to 48 hours.  Patient knows worsening symptoms to call us or seek medical attention.  Patient follow-up as needed for this ?

## 2021-05-30 ENCOUNTER — Other Ambulatory Visit (HOSPITAL_COMMUNITY): Payer: Self-pay

## 2021-05-31 ENCOUNTER — Other Ambulatory Visit (HOSPITAL_COMMUNITY): Payer: Self-pay

## 2021-06-05 ENCOUNTER — Other Ambulatory Visit (HOSPITAL_COMMUNITY): Payer: Self-pay

## 2021-06-05 MED ORDER — WEGOVY 1.7 MG/0.75ML ~~LOC~~ SOAJ
1.7000 mg | SUBCUTANEOUS | 3 refills | Status: DC
  Filled 2021-06-05: qty 2, 28d supply, fill #0

## 2021-06-09 ENCOUNTER — Other Ambulatory Visit (HOSPITAL_COMMUNITY): Payer: Self-pay

## 2021-06-09 MED ORDER — WEGOVY 2.4 MG/0.75ML ~~LOC~~ SOAJ
2.4000 mg | SUBCUTANEOUS | 3 refills | Status: DC
  Filled 2021-06-09 – 2021-06-23 (×4): qty 3, 28d supply, fill #0
  Filled 2021-08-03: qty 3, 28d supply, fill #1
  Filled 2021-08-30: qty 3, 28d supply, fill #2
  Filled 2021-09-28: qty 3, 28d supply, fill #3

## 2021-06-14 ENCOUNTER — Ambulatory Visit (INDEPENDENT_AMBULATORY_CARE_PROVIDER_SITE_OTHER): Payer: 59

## 2021-06-14 ENCOUNTER — Other Ambulatory Visit (HOSPITAL_COMMUNITY): Payer: Self-pay

## 2021-06-14 DIAGNOSIS — G35 Multiple sclerosis: Secondary | ICD-10-CM | POA: Diagnosis not present

## 2021-06-14 MED ORDER — GADOBENATE DIMEGLUMINE 529 MG/ML IV SOLN
12.0000 mL | Freq: Once | INTRAVENOUS | Status: AC | PRN
  Administered 2021-06-14: 12 mL via INTRAVENOUS

## 2021-06-19 LAB — LAB REPORT - SCANNED
A1c: 4.8
EGFR: 109

## 2021-06-21 NOTE — Progress Notes (Signed)
?Charlann Boxer D.O. ?[facility] ?[place] ?Phone: [phone] ?Subjective:   ?I, Vilma Meckel, am serving as a Education administrator for Dr. Hulan Saas. ?This visit occurred during the SARS-CoV-2 public health emergency.  Safety protocols were in place, including screening questions prior to the visit, additional usage of staff PPE, and extensive cleaning of exam room while observing appropriate contact time as indicated for disinfecting solutions.  ? ?I'm seeing this patient by the request  of:  Patient, No Pcp Per (Inactive) ? ?CC: Back and neck pain follow-up ? ?[id number]  ?05/25/2021 ?Finger pain seems to be more of a subluxation of the lateral plate.  Patient has had this previously.  Patient did respond well to manipulation today.  Patient given a frog splint to wear and keep immobilized for the next 24 to 48 hours.  Patient knows worsening symptoms to call us or seek medical attention.  Patient follow-up as needed for this ? ?Update 06/23/2021 ?Martha [name] is a 41 y.o. female coming in with complaint of R finger pain. Patient states finger is doing well. Here for manipulation. No new complaints.  Patient states overall doing okay.  Has been traveling recently.  Was on vacation which was helpful but then back at the computer seems to give more discomfort and pain. ? ? ? ?  ? ?Past Medical History:  ?Diagnosis Date  ? Asthma   ? Lichen planus   ? MS (multiple sclerosis) ([place])   ? PCOS (polycystic ovarian syndrome)   ? Raynaud's disease   ? ?Past Surgical History:  ?Procedure Laterality Date  ? colonoscopy    ? DILATION AND CURETTAGE OF UTERUS    ? x 3  ? ESOPHAGOGASTRODUODENOSCOPY    ? TONSILLECTOMY AND ADENOIDECTOMY    ? ?Social History  ? ?Socioeconomic History  ? Marital status: Married  ?  Spouse name: Not on file  ? Number of children: 0  ? Years of education: college  ? Highest education level: Professional school degree (e.g., MD, DDS, DVM, JD)  ?Occupational History   ? Occupation: physician  ?Tobacco Use  ? Smoking status: Never  ? Smokeless tobacco: Never  ?Vaping Use  ? Vaping Use: Never used  ?Substance and Sexual Activity  ? Alcohol use: No  ? Drug use: No  ? Sexual activity: Not on file  ?Other Topics Concern  ? Not on file  ?Social History Narrative  ? Right handed  ? 1-2 cups caffeine daily  ? Lives at home with husband  ? ?Social Determinants of Health  ? ?Financial Resource Strain: Not on file  ?Food Insecurity: Not on file  ?Transportation Needs: Not on file  ?Physical Activity: Not on file  ?Stress: Not on file  ?Social Connections: Not on file  ? ?No Known Allergies ?Family History  ?Problem Relation Age of Onset  ? Diabetes Mother   ? Thyroid disease Mother   ? Hypertension Mother   ? Hypercholesterolemia Mother   ? Migraines Mother   ? Depression Mother   ? Diabetes Father   ? Thyroid disease Father   ? CAD Father   ? Hypercholesterolemia Father   ? Hypertension Father   ? Depression Father   ? Pernicious anemia Father   ? Breast cancer Paternal Aunt   ? ? ? ?Current Outpatient Medications (Cardiovascular):  ?  amLODipine (NORVASC) 2.5 MG tablet, Take 1 tablet (2.5 mg total) by mouth daily. ? ? ?Current Outpatient Medications (Analgesics):  ?  rizatriptan (MAXALT)  5 MG tablet, TAKE 1 TABLET (5 MG TOTAL) BY MOUTH AS NEEDED FOR MIGRAINE. MAY REPEAT IN 2 HOURS IF NEEDED ? ? ?Current Outpatient Medications (Other):  ?  doxepin (SINEQUAN) 10 MG capsule, Take 1 capsule (10 mg total) by mouth at bedtime. ?  esomeprazole (NEXIUM) 40 MG capsule, TAKE 1 CAPSULE BY MOUTH TWICE DAILY 30 MINUTES BEFORE BREAKFAST AND DINNER. ?  ondansetron (ZOFRAN) 4 MG tablet, Take 1 tablet (4 mg total) by mouth 2 (two) times daily as needed. ?  Semaglutide-Weight Management (WEGOVY) 1.7 MG/0.75ML SOAJ, Inject 1.7 mg into the skin once a week. ?  Semaglutide-Weight Management (WEGOVY) 2.4 MG/0.75ML SOAJ, Inject 2.4 mg into the skin once a week. ?  VUMERITY 231 MG CPDR, Take 462 mg by mouth in  the morning and at bedtime. ?  Zinc 50 MG TABS, Take 1 tablet by mouth as needed. ?  zolpidem (AMBIEN) 10 MG tablet, TAKE 1/2 TO 1 TABLET BY MOUTH AT BEDTIME ? ? ?Reviewed prior external information including notes and imaging from  ?primary care provider ?As well as notes that were available from care everywhere and other healthcare systems. ? ?Past medical history, social, surgical and family history all reviewed in electronic medical record.  No pertanent information unless stated regarding to the chief complaint.  ? ?Review of Systems: ? No headache, visual changes, nausea, vomiting, diarrhea, constipation, dizziness, abdominal pain, skin rash, fevers, chills, night sweats, weight loss, swollen lymph nodes, body aches, joint swelling, chest pain, shortness of breath, mood changes. POSITIVE muscle aches ? ?Objective  ?Blood pressure 112/70, pulse 79, height '5\' 5"'$  (1.651 m), weight 133 lb (60.3 kg), SpO2 98 %. ?  ?General: No apparent distress alert and oriented x3 mood and affect normal, dressed appropriately.  ?HEENT: Pupils equal, extraocular movements intact  ?Respiratory: Patient's speak in full sentences and does not appear short of breath  ?Cardiovascular: No lower extremity edema, non tender, no erythema  ?Gait normal with good balance and coordination.  ?MSK: Low back exam does have some loss of lordosis.  Some tenderness to palpation noted as well.  Patient does have it mostly in the parascapular region on the right side.  Does have some tightness noted of the right side of the paraspinal musculature of the lumbar spine as well. ? ?Osteopathic findings ? ?T4 extended rotated and side bent right inhaled third rib ?T9 extended rotated and side bent left ?L1 flexed rotated and side bent right ?Sacrum right on right ? ? ?  ?Impression and Recommendations:  ?  ? ?The above documentation has been reviewed and is accurate and complete Lyndal Pulley, DO ? ? ? ?

## 2021-06-22 ENCOUNTER — Other Ambulatory Visit (HOSPITAL_COMMUNITY): Payer: Self-pay

## 2021-06-23 ENCOUNTER — Other Ambulatory Visit (HOSPITAL_COMMUNITY): Payer: Self-pay

## 2021-06-23 ENCOUNTER — Ambulatory Visit (INDEPENDENT_AMBULATORY_CARE_PROVIDER_SITE_OTHER): Payer: 59 | Admitting: Family Medicine

## 2021-06-23 VITALS — BP 112/70 | HR 79 | Ht 65.0 in | Wt 133.0 lb

## 2021-06-23 DIAGNOSIS — M9908 Segmental and somatic dysfunction of rib cage: Secondary | ICD-10-CM | POA: Diagnosis not present

## 2021-06-23 DIAGNOSIS — E669 Obesity, unspecified: Secondary | ICD-10-CM | POA: Diagnosis not present

## 2021-06-23 DIAGNOSIS — R112 Nausea with vomiting, unspecified: Secondary | ICD-10-CM | POA: Diagnosis not present

## 2021-06-23 DIAGNOSIS — M546 Pain in thoracic spine: Secondary | ICD-10-CM | POA: Diagnosis not present

## 2021-06-23 DIAGNOSIS — M999 Biomechanical lesion, unspecified: Secondary | ICD-10-CM

## 2021-06-23 DIAGNOSIS — M9902 Segmental and somatic dysfunction of thoracic region: Secondary | ICD-10-CM | POA: Diagnosis not present

## 2021-06-23 DIAGNOSIS — G8929 Other chronic pain: Secondary | ICD-10-CM | POA: Diagnosis not present

## 2021-06-23 DIAGNOSIS — M9901 Segmental and somatic dysfunction of cervical region: Secondary | ICD-10-CM

## 2021-06-23 MED ORDER — ONDANSETRON HCL 4 MG PO TABS
4.0000 mg | ORAL_TABLET | Freq: Two times a day (BID) | ORAL | 3 refills | Status: DC | PRN
  Filled 2021-06-23: qty 60, 30d supply, fill #0
  Filled 2021-11-23: qty 60, 30d supply, fill #1
  Filled 2022-01-21: qty 60, 30d supply, fill #2

## 2021-06-23 NOTE — Assessment & Plan Note (Signed)
Chronic, with mild exacerbation.  Patient has been sitting more recently and I think that this could be potentially contributing.  We discussed continuing to change to her adjustable standing desk.  Patient does the exercises occasionally.  Still has done relatively well with the weight loss that I do think has been significantly helpful as well.  Discussed which activities to do and which ones to avoid.  Increase activity slowly.  Follow-up again in 6 to 8 weeks. ?

## 2021-06-23 NOTE — Assessment & Plan Note (Signed)
? ?  Decision today to treat with OMT was based on Physical Exam  After verbal consent patient was treated with HVLA, ME, FPR techniques in  thoracic, rib, lumbar and sacral areas, all areas are chronic   Patient tolerated the procedure well with improvement in symptoms  Patient given exercises, stretches and lifestyle modifications  See medications in patient instructions if given  Patient will follow up in 4-8 weeks 

## 2021-07-12 ENCOUNTER — Other Ambulatory Visit (HOSPITAL_COMMUNITY): Payer: Self-pay

## 2021-07-19 DIAGNOSIS — Z6821 Body mass index (BMI) 21.0-21.9, adult: Secondary | ICD-10-CM | POA: Diagnosis not present

## 2021-07-19 DIAGNOSIS — Z1231 Encounter for screening mammogram for malignant neoplasm of breast: Secondary | ICD-10-CM | POA: Diagnosis not present

## 2021-07-19 DIAGNOSIS — Z01419 Encounter for gynecological examination (general) (routine) without abnormal findings: Secondary | ICD-10-CM | POA: Diagnosis not present

## 2021-08-01 NOTE — Progress Notes (Unsigned)
Martha [name] Phone: [phone] Subjective:   Martha [name], am serving as a scribe for Dr. Hulan Saas.   I'm seeing this patient by the request  of:  Patient, [name] (Inactive)  CC: Back and neck pain follow-up  [id number]  Martha [name] is a 41 y.o. female coming in with complaint of back and neck pain. OMT on 06/23/2021. Patient states that a 6 days ago she woke up and had pain in R great toe. Has been wearing HOKA. Swelling has gone down. Was unable to extend toe.     Medications patient has been prescribed: None  Taking:         Reviewed prior external information including notes and imaging from previsou exam, outside providers and external EMR if available.   As well as notes that were available from care everywhere and other healthcare systems.  Past medical history, social, surgical and family history all reviewed in electronic medical record.  [name] pertanent information unless stated regarding to the chief complaint.   Past Medical History:  Diagnosis Date   Asthma    Lichen planus    MS (multiple sclerosis) (HCC)    PCOS (polycystic ovarian syndrome)    Raynaud's disease     [name] Known Allergies   Review of Systems:  [name] headache, visual changes, nausea, vomiting, diarrhea, constipation, dizziness, abdominal pain, skin rash, fevers, chills, night sweats, weight loss, swollen lymph nodes, body aches, joint swelling, chest pain, shortness of breath, mood changes. POSITIVE muscle aches  Objective  Blood pressure 108/70, height '5\' 5"'$  (1.651 m), weight 127 lb (57.6 kg).   General: [name] apparent distress alert and oriented x3 mood and affect normal, dressed appropriately.  HEENT: Pupils equal, extraocular movements intact  Respiratory: Patient's speak in full sentences and does not appear short of breath  Cardiovascular: [name] lower extremity edema, non tender, [name] erythema  Neck exam  shows mild loss of lordosis.  Patient does have some limited sidebending as well.  Negative Spurling's.  Patient does still have tightness noted in the parascapular region more than anywhere else. First MTP and does have some mild limited extension noted.  Otherwise [name] significant swelling noted.  Nontender on exam.  Limited muscular skeletal ultrasound was performed and interpreted by Hulan Saas, M  Limited ultrasound of patient's first MTP shows that patient does have some mild enlargement of the synovium but [name] significant swelling noted at the moment.  [name] loose body. Impression: Resolving inflammation and effusion of the MTP  Osteopathic findings  C2 flexed rotated and side bent right C6 flexed rotated and side bent left T3 extended rotated and side bent right inhaled rib T8 extended rotated and side bent left inhaled rib noted L2 flexed rotated and side bent right Sacrum right on right       Assessment and Plan: Toe pain Patient does have a picture on the right side.  Patient did have some swelling noted of the MTP joint.  Discussed which activities to do and which ones to avoid.  Discussed proper shoes.  Does not appear to be anything such as gout.  Patient does not remember any true injury.  Patient has had several but this did look different as well.  Patient will monitor and if any significant worsening will get back to [place] otherwise we will follow-up as needed for this problem  Thoracic back pain Chronic problem but does respond well to osteopathic manipulation.  Discussed icing regimen.  Has been making progress overall though.  Patient has done well and has lost significant amount of weight and has started to strengthen some of the postural muscles.  We will follow-up with me again 6 to 8 weeks    Nonallopathic problems  Decision today to treat with OMT was based on Physical Exam  After verbal consent patient was treated with HVLA, ME, FPR techniques in cervical, rib,  thoracic, lumbar, and sacral  areas  Patient tolerated the procedure well with improvement in symptoms  Patient given exercises, stretches and lifestyle modifications  See medications in patient instructions if given  Patient will follow up in 4-8 weeks      The above documentation has been reviewed and is accurate and complete Lyndal Pulley, DO        Note: This dictation was prepared with Dragon dictation along with smaller phrase technology. Any transcriptional errors that result from this process are unintentional.

## 2021-08-02 ENCOUNTER — Ambulatory Visit (INDEPENDENT_AMBULATORY_CARE_PROVIDER_SITE_OTHER): Payer: 59 | Admitting: Family Medicine

## 2021-08-02 VITALS — BP 108/70 | Ht 65.0 in | Wt 127.0 lb

## 2021-08-02 DIAGNOSIS — M79676 Pain in unspecified toe(s): Secondary | ICD-10-CM | POA: Insufficient documentation

## 2021-08-02 DIAGNOSIS — M9904 Segmental and somatic dysfunction of sacral region: Secondary | ICD-10-CM

## 2021-08-02 DIAGNOSIS — M79674 Pain in right toe(s): Secondary | ICD-10-CM

## 2021-08-02 DIAGNOSIS — M9908 Segmental and somatic dysfunction of rib cage: Secondary | ICD-10-CM

## 2021-08-02 DIAGNOSIS — M9902 Segmental and somatic dysfunction of thoracic region: Secondary | ICD-10-CM | POA: Diagnosis not present

## 2021-08-02 DIAGNOSIS — M9901 Segmental and somatic dysfunction of cervical region: Secondary | ICD-10-CM

## 2021-08-02 DIAGNOSIS — G8929 Other chronic pain: Secondary | ICD-10-CM

## 2021-08-02 DIAGNOSIS — M546 Pain in thoracic spine: Secondary | ICD-10-CM

## 2021-08-02 DIAGNOSIS — M9903 Segmental and somatic dysfunction of lumbar region: Secondary | ICD-10-CM

## 2021-08-02 NOTE — Assessment & Plan Note (Signed)
Patient does have a picture on the right side.  Patient did have some swelling noted of the MTP joint.  Discussed which activities to do and which ones to avoid.  Discussed proper shoes.  Does not appear to be anything such as gout.  Patient does not remember any true injury.  Patient has had several but this did look different as well.  Patient will monitor and if any significant worsening will get back to [place] otherwise we will follow-up as needed for this problem

## 2021-08-02 NOTE — Assessment & Plan Note (Signed)
Chronic problem but does respond well to osteopathic manipulation.  Discussed icing regimen.  Has been making progress overall though.  Patient has done well and has lost significant amount of weight and has started to strengthen some of the postural muscles.  We will follow-up with me again 6 to 8 weeks

## 2021-08-04 ENCOUNTER — Ambulatory Visit: Payer: 59 | Admitting: Family Medicine

## 2021-08-04 ENCOUNTER — Other Ambulatory Visit (HOSPITAL_COMMUNITY): Payer: Self-pay

## 2021-08-09 ENCOUNTER — Telehealth: Payer: Self-pay | Admitting: *Deleted

## 2021-08-09 NOTE — Telephone Encounter (Signed)
The request has been approved. The authorization is effective for a maximum of 12 fills from 08/09/2021 to 08/09/2022, as long as the member is enrolled in their current health plan. The request was approved with a quantity restriction. This has been approved for a max daily dosage of 4. A written notification letter will follow with additional details.  Approval letter faxed to [facility] and patient has been notified.

## 2021-08-09 NOTE — Telephone Encounter (Signed)
Completed [employer] on Cover My Meds. Key: [id number]. Awaiting determination from [place]. Urgent response requested.

## 2021-08-17 ENCOUNTER — Other Ambulatory Visit (HOSPITAL_COMMUNITY): Payer: Self-pay

## 2021-08-30 ENCOUNTER — Other Ambulatory Visit: Payer: Self-pay | Admitting: Neurology

## 2021-08-31 ENCOUNTER — Other Ambulatory Visit (HOSPITAL_COMMUNITY): Payer: Self-pay

## 2021-09-07 NOTE — Progress Notes (Unsigned)
[place] Phone: [phone] Subjective:   Martha [name], am serving as a scribe for Dr. Hulan Saas.  I'm seeing this patient by the request  of:  Patient, [name]  CC: Back pain and neck pain follow-up  [id number]  Martha [name] is a 41 y.o. female coming in with complaint of back and neck pain. OMT on 08/02/2021. Also seen for toe pain on right foot. Patient states that she has a rib out of place on R side. Feels rib when she exhales. Patient was working with it.  Patient is doing a job where there is more sitting at the moment.  Patient denies any worsening symptoms, any weakness of any of the extremities.  Medications patient has been prescribed: None  Taking:         Reviewed prior external information including notes and imaging from previsou exam, outside providers and external EMR if available.   As well as notes that were available from care everywhere and other healthcare systems.   Past medical history, social, surgical and family history all reviewed in electronic medical record.  [name] pertanent information unless stated regarding to the chief complaint.   Past Medical History:  Diagnosis Date   Asthma    Lichen planus    MS (multiple sclerosis) (HCC)    PCOS (polycystic ovarian syndrome)    Raynaud's disease     [name] Known Allergies   Review of Systems:  [name] headache, visual changes, nausea, vomiting, diarrhea, constipation, dizziness, abdominal pain, skin rash, fevers, chills, night sweats, weight loss, swollen lymph nodes, body aches, joint swelling, chest pain, shortness of breath, mood changes. POSITIVE muscle aches  Objective  Blood pressure 108/76, height '5\' 5"'$  (1.651 m), weight 127 lb (57.6 kg).   General: [name] apparent distress alert and oriented x3 mood and affect normal, dressed appropriately.  HEENT: Pupils equal, extraocular movements intact  Respiratory: Patient's  speak in full sentences and does not appear short of breath  Cardiovascular: [name] lower extremity edema, non tender, [name] erythema  Gait MSK:  Back does show some very mild loss of lordosis.  Tightness noted in the parascapular region right greater than left.  Patient does have some limited sidebending of the neck right greater than left.  Negative Spurling's.  5 out of 5 strength of all the extremities  Osteopathic findings  C2 flexed rotated and side bent right C6 flexed rotated and side bent right T3 extended rotated and side bent right inhaled rib L4 flexed rotated and side bent left  Sacrum right on right     Assessment and Plan:  Thoracic back pain Continues to have some tightness in the thoracic back pain in the scapular area.  Slipped rib syndrome is still in the differential as well.  Patient is to continue to work on Engineer, building services.  Discussed changes in the work positioning that could be beneficial.  Patient overall though has done very well with weight loss and strengthening.  Follow-up again 6 weeks    Nonallopathic problems  Decision today to treat with OMT was based on Physical Exam  After verbal consent patient was treated with HVLA, ME, FPR techniques in cervical, rib, thoracic, lumbar, and sacral  areas  Patient tolerated the procedure well with improvement in symptoms  Patient given exercises, stretches and lifestyle modifications  See medications in patient instructions if given  Patient will follow up in 4-8 weeks  The above documentation has been reviewed and is accurate and complete Martha Pulley, DO         Note: This dictation was prepared with Dragon dictation along with smaller phrase technology. Any transcriptional errors that result from this process are unintentional.

## 2021-09-11 ENCOUNTER — Other Ambulatory Visit: Payer: Self-pay | Admitting: *Deleted

## 2021-09-11 ENCOUNTER — Encounter: Payer: Self-pay | Admitting: Neurology

## 2021-09-11 ENCOUNTER — Other Ambulatory Visit: Payer: Self-pay | Admitting: Neurology

## 2021-09-11 ENCOUNTER — Other Ambulatory Visit (HOSPITAL_COMMUNITY): Payer: Self-pay

## 2021-09-12 ENCOUNTER — Other Ambulatory Visit (HOSPITAL_COMMUNITY): Payer: Self-pay

## 2021-09-12 MED ORDER — ZOLPIDEM TARTRATE 10 MG PO TABS: 5.0000 mg | ORAL_TABLET | Freq: Every day | ORAL | 5 refills | Status: DC

## 2021-09-12 MED ORDER — ZOLPIDEM TARTRATE 10 MG PO TABS
5.0000 mg | ORAL_TABLET | Freq: Every day | ORAL | 5 refills | Status: DC
  Filled 2021-09-12: qty 30, 30d supply, fill #0

## 2021-09-13 ENCOUNTER — Ambulatory Visit (INDEPENDENT_AMBULATORY_CARE_PROVIDER_SITE_OTHER): Payer: 59 | Admitting: Family Medicine

## 2021-09-13 VITALS — BP 108/76 | Ht 65.0 in | Wt 127.0 lb

## 2021-09-13 DIAGNOSIS — M9908 Segmental and somatic dysfunction of rib cage: Secondary | ICD-10-CM | POA: Diagnosis not present

## 2021-09-13 DIAGNOSIS — M9902 Segmental and somatic dysfunction of thoracic region: Secondary | ICD-10-CM | POA: Diagnosis not present

## 2021-09-13 DIAGNOSIS — M9901 Segmental and somatic dysfunction of cervical region: Secondary | ICD-10-CM | POA: Diagnosis not present

## 2021-09-13 DIAGNOSIS — M546 Pain in thoracic spine: Secondary | ICD-10-CM | POA: Diagnosis not present

## 2021-09-13 DIAGNOSIS — M9903 Segmental and somatic dysfunction of lumbar region: Secondary | ICD-10-CM | POA: Diagnosis not present

## 2021-09-13 DIAGNOSIS — G8929 Other chronic pain: Secondary | ICD-10-CM

## 2021-09-13 DIAGNOSIS — M9904 Segmental and somatic dysfunction of sacral region: Secondary | ICD-10-CM | POA: Diagnosis not present

## 2021-09-13 NOTE — Patient Instructions (Signed)
Good to see you See me again in

## 2021-09-14 NOTE — Assessment & Plan Note (Signed)
Continues to have some tightness in the thoracic back pain in the scapular area.  Slipped rib syndrome is still in the differential as well.  Patient is to continue to work on Engineer, building services.  Discussed changes in the work positioning that could be beneficial.  Patient overall though has done very well with weight loss and strengthening.  Follow-up again 6 weeks

## 2021-09-29 ENCOUNTER — Other Ambulatory Visit (HOSPITAL_COMMUNITY): Payer: Self-pay

## 2021-10-17 NOTE — Progress Notes (Unsigned)
[place] Phone: [phone] Subjective:   Martha Martha, am serving as a scribe for Dr. Hulan Saas.  I'm seeing this patient by the request  of:  Patient, Martha Martha  CC: Neck and back pain follow-up  [id number]  Martha Martha is a 41 y.o. female coming in with complaint of back and neck pain. OMT 09/13/2021. Patient states that her back is doing fine since last visit. Rib was out of place last week but is now back in place. Working on stretches which have helped.   Medications patient has been prescribed: None  Taking:         Reviewed prior external information including notes and imaging from previsou exam, outside providers and external EMR if available.   As well as notes that were available from care everywhere and other healthcare systems.  Past medical history, social, surgical and family history all reviewed in electronic medical record.  Martha pertanent information unless stated regarding to the chief complaint.   Past Medical History:  Diagnosis Date   Asthma    Lichen planus    MS (multiple sclerosis) (HCC)    PCOS (polycystic ovarian syndrome)    Raynaud's disease     Martha Known Allergies   Review of Systems:  Martha headache, visual changes, nausea, vomiting, diarrhea, constipation, dizziness, abdominal pain, skin rash, fevers, chills, night sweats, weight loss, swollen lymph nodes, body aches, joint swelling, chest pain, shortness of breath, mood changes. POSITIVE muscle aches  Objective  Blood pressure 94/66, pulse 66, height '5\' 5"'$  (1.651 m), weight 130 lb (59 kg), SpO2 99 %.   General: Martha apparent distress alert and oriented x3 mood and affect normal, dressed appropriately.  HEENT: Pupils equal, extraocular movements intact  Respiratory: Patient's speak in full sentences and does not appear short of breath  Cardiovascular: Martha lower extremity edema, non tender, Martha erythema   Gait MSK:  Back mild tightness noted more in the right paraspinal musculature.  Patient does have what appears to be more to be out of place.  Neck exam does have some cervical spine  Osteopathic findings  C2 flexed rotated and side bent left  C6 flexed rotated and side bent right  T3 extended rotated and side bent right inhaled rib T8 extended rotated and side bent left L3 flexed rotated and side bent right Sacrum right on right       Assessment and Plan:  Thoracic back pain Multifactorial.  Since patient's weight loss has been doing significantly better.  He was found to have more tightness noted increase in stress likely contributing.  Not taking any medications for this regularly.  Follow-up with me again in 6 to 8 weeks    Nonallopathic problems  Decision today to treat with OMT was based on Physical Exam  After verbal consent patient was treated with HVLA, ME, FPR techniques in cervical, rib, thoracic, lumbar, and sacral  areas  Patient tolerated the procedure well with improvement in symptoms  Patient given exercises, stretches and lifestyle modifications  See medications in patient instructions if given  Patient will follow up in 4-8 weeks    The above documentation has been reviewed and is accurate and complete Martha Pulley, DO          Note: This dictation was prepared with Dragon dictation along with smaller phrase technology. Any transcriptional errors that result from this process are unintentional.

## 2021-10-18 ENCOUNTER — Ambulatory Visit (INDEPENDENT_AMBULATORY_CARE_PROVIDER_SITE_OTHER): Payer: 59 | Admitting: Family Medicine

## 2021-10-18 VITALS — BP 94/66 | HR 66 | Ht 65.0 in | Wt 130.0 lb

## 2021-10-18 DIAGNOSIS — M9908 Segmental and somatic dysfunction of rib cage: Secondary | ICD-10-CM

## 2021-10-18 DIAGNOSIS — G8929 Other chronic pain: Secondary | ICD-10-CM | POA: Diagnosis not present

## 2021-10-18 DIAGNOSIS — M9904 Segmental and somatic dysfunction of sacral region: Secondary | ICD-10-CM | POA: Diagnosis not present

## 2021-10-18 DIAGNOSIS — M9901 Segmental and somatic dysfunction of cervical region: Secondary | ICD-10-CM | POA: Diagnosis not present

## 2021-10-18 DIAGNOSIS — M546 Pain in thoracic spine: Secondary | ICD-10-CM | POA: Diagnosis not present

## 2021-10-18 DIAGNOSIS — M9902 Segmental and somatic dysfunction of thoracic region: Secondary | ICD-10-CM | POA: Diagnosis not present

## 2021-10-18 DIAGNOSIS — M9903 Segmental and somatic dysfunction of lumbar region: Secondary | ICD-10-CM | POA: Diagnosis not present

## 2021-10-18 NOTE — Assessment & Plan Note (Signed)
Multifactorial.  Since patient's weight loss has been doing significantly better.  He was found to have more tightness noted increase in stress likely contributing.  Not taking any medications for this regularly.  Follow-up with me again in 6 to 8 weeks

## 2021-10-26 ENCOUNTER — Other Ambulatory Visit (HOSPITAL_COMMUNITY): Payer: Self-pay

## 2021-10-30 ENCOUNTER — Other Ambulatory Visit (HOSPITAL_COMMUNITY): Payer: Self-pay

## 2021-10-30 MED ORDER — WEGOVY 2.4 MG/0.75ML ~~LOC~~ SOAJ
2.4000 mg | SUBCUTANEOUS | 0 refills | Status: DC
  Filled 2021-10-30: qty 3, 28d supply, fill #0

## 2021-10-31 ENCOUNTER — Other Ambulatory Visit (HOSPITAL_COMMUNITY): Payer: Self-pay

## 2021-10-31 MED ORDER — WEGOVY 2.4 MG/0.75ML ~~LOC~~ SOAJ
2.4000 mg | SUBCUTANEOUS | 3 refills | Status: DC
  Filled 2021-10-31 – 2021-11-23 (×2): qty 3, 28d supply, fill #0
  Filled 2021-12-21: qty 3, 28d supply, fill #1
  Filled 2022-01-19: qty 3, 28d supply, fill #2
  Filled 2022-04-26: qty 3, 28d supply, fill #3

## 2021-11-21 ENCOUNTER — Encounter: Payer: Self-pay | Admitting: Neurology

## 2021-11-21 ENCOUNTER — Other Ambulatory Visit (HOSPITAL_COMMUNITY): Payer: Self-pay

## 2021-11-21 ENCOUNTER — Ambulatory Visit (INDEPENDENT_AMBULATORY_CARE_PROVIDER_SITE_OTHER): Payer: 59 | Admitting: Neurology

## 2021-11-21 VITALS — BP 108/73 | HR 76 | Ht 65.0 in | Wt 132.4 lb

## 2021-11-21 DIAGNOSIS — I7300 Raynaud's syndrome without gangrene: Secondary | ICD-10-CM

## 2021-11-21 DIAGNOSIS — G43001 Migraine without aura, not intractable, with status migrainosus: Secondary | ICD-10-CM

## 2021-11-21 DIAGNOSIS — R208 Other disturbances of skin sensation: Secondary | ICD-10-CM | POA: Diagnosis not present

## 2021-11-21 DIAGNOSIS — G4700 Insomnia, unspecified: Secondary | ICD-10-CM | POA: Diagnosis not present

## 2021-11-21 DIAGNOSIS — Z79899 Other long term (current) drug therapy: Secondary | ICD-10-CM | POA: Diagnosis not present

## 2021-11-21 DIAGNOSIS — G35 Multiple sclerosis: Secondary | ICD-10-CM | POA: Diagnosis not present

## 2021-11-21 MED ORDER — RAMELTEON 8 MG PO TABS
8.0000 mg | ORAL_TABLET | Freq: Every day | ORAL | 11 refills | Status: DC
  Filled 2021-11-21: qty 30, 30d supply, fill #0

## 2021-11-21 NOTE — Progress Notes (Signed)
[facility]   PATIENT: Martha [name] DOB: Dec 14, 1980  REFERRING DOCTOR OR PCP:  [name] [facility]) SOURCE: Patient, notes from neurology, laboratory reports, imaging reports, MRI images personally reviewed.  _________________________________   HISTORICAL  CHIEF COMPLAINT:  Chief Complaint  Patient presents with   Follow-up    RM 1, alone. Last seen 05/15/21.  MS DMT: Vumerity. Tolerating well. She gets dizzy sometimes. Unsure if r/t sinus issues. BP runs on lower end. Dizzy randomly throughout the day. Uses nasal spray for allergies.     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 woman with multiple sclerosis.  Update 11/21/2021:  She is on Vumerity and tolerates it well. She flushes if takes without food.   No GI issues  No exacerbations and she has some fluctuating symptoms.     Gait and balance are good on a flat surface.  She had one fall in 2022 on stairs so she usually holds the bannister.  She no longer has a squeezing dysesthesia and still has mild sensation like water running down calves or vibrating but these are not too bad.   Vision is better than 20/20.   ON was her first exacerbatin in 2009     Fatigue is doing ok.  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   Vision is fine.  Bladder is fine.   She had ON near the time of diagnosis in 2009.  She saw Dr. [name] recently and her exam was fine.      She is having migraines often triggered by weather changes and a work.during the day.  She has pounding pain usually one-sided.   She was having  3-4 migraines a month butnow they are daily a few weeks.    She takes Excedrin migraine initially if one occurs.     Maxalt helped and is tolerated ok at night but makes her sleepy.   Naratriptan caused nausea and did not reduce the migraine..   She has not tried an  oral anti-CGRP agent.     She has sleep onset and maintenance insomnia (usually after 4-5 hours).   Ambien helps sleep onset but she wakes up 4 hours later and then has trouble falling back asleep.  Doxepin one hour before HS .  Trazodone helped but caused nasal congestion.     She has lost 70 pounds on purpose, helped by semaglutide and diet.   She has Raynaud's, left > right hand.  She wears heated socks which helps feet.   She has had this for many years but the episodes are more painful recently.  She generally does worse during winter than summer.   MS History She was diagnosed with MS in 2009 after presenting with right optic neuritis.  She initially saw ophthalmology and was referred to neuro-ophthalmology (Dr. [name]).  After an MRI showed changes consistent with MS, she was referred to Dr. [name], at [facility] neurology.     She was placed on Avonex and tolerated it well.   Due to LFT elevation, she switched to Copaxone.    She had tolerability issues and switched to Tecfidera in 2013.  She had some mild liver elevation.  Her MS was stable on all of these medications.  She stopped Tecfidera in 2017 due to family planning issues.    She has not  been able to get pregnant and is ready to restart therapy.  She started Vumerity August 2021.  She has low vitamin D and takes supplements weekly.       Imaging:  MRI of the brain 12/05/2018 showed multiple T2/FLAIR hyperintense foci, predominantly in the periventricular white matter radially oriented to the ventricles.  A couple foci were also noted in the juxtacortical white matter, possibly a couple punctate foci in the pons, deep white matter and one focus in the right cerebellar hemisphere.  None of the foci enhanced.  There were no films for comparison.    She reports a cervical spine MRI in 2013 did not show any plaque.      MRI lumbar in 2015 showed mild facet hypertrophy at a few levels.     MRI brain 11/03/2019 showed multiple T2/FLAIR  hyperintense foci in the periventricular, juxtacortical and deep white matter of both hemispheres consistent with demyelinating plaque associated with multiple sclerosis. Most of the foci were present on the 12/05/2018 MRI. However, there is one new juxtacortical focus in the right hemisphere. That focus also enhances after contrast implying a more acute or subacute timeframe.  MRI cervical spine 11/03/2019 showed a normal spinal cord and no significant DJD.  OTHER: She had Lichen Planus on the left arm and has a sore in her mouth potentially the same diagnosis.  She is going to see an oral surgeon for possible biopsy.   REVIEW OF SYSTEMS: Constitutional: No fevers, chills, sweats, or change in appetite Eyes: No visual changes, double vision, eye pain Ear, nose and throat: No hearing loss, ear pain, nasal congestion, sore throat.  She currently has a sore in her mouth potentially worrisome for lichen planus Cardiovascular: No chest pain, palpitations Respiratory:  No shortness of breath at rest or with exertion.   No wheezes GastrointestinaI: No nausea, vomiting, diarrhea, abdominal pain, fecal incontinence Genitourinary:  No dysuria, urinary retention or frequency.  No nocturia. Musculoskeletal:  No neck pain.  She has some back pain. Integumentary: No rash, pruritus.  She had lichen planus in the left arm Neurological: as above Psychiatric: No depression at this time.  No anxiety Endocrine: No palpitations, diaphoresis, change in appetite, change in weigh or increased thirst Hematologic/Lymphatic:  No anemia, purpura, petechiae. Allergic/Immunologic: No itchy/runny eyes, nasal congestion, recent allergic reactions, rashes  ALLERGIES: No Known Allergies  HOME MEDICATIONS:  Current Outpatient Medications:    amLODipine (NORVASC) 2.5 MG tablet, Take 1 tablet (2.5 mg total) by mouth daily. (Patient taking differently: Take 2.5 mg by mouth daily. Only takes during winter months), Disp: 90  tablet, Rfl: 0   doxepin (SINEQUAN) 10 MG capsule, Take 1 capsule (10 mg total) by mouth at bedtime., Disp: 90 capsule, Rfl: 3   ondansetron (ZOFRAN) 4 MG tablet, Take 1 tablet (4 mg total) by mouth 2 (two) times daily as needed., Disp: 60 tablet, Rfl: 3   ondansetron (ZOFRAN) 4 MG tablet, Take 1 tablet (4 mg total) by mouth 2 (two) times daily as needed., Disp: 60 tablet, Rfl: 3   ramelteon (ROZEREM) 8 MG tablet, Take 1 tablet (8 mg total) by mouth at bedtime., Disp: 30 tablet, Rfl: 11   rizatriptan (MAXALT) 5 MG tablet, TAKE 1 TABLET (5 MG TOTAL) BY MOUTH AS NEEDED FOR MIGRAINE. MAY REPEAT IN 2 HOURS IF NEEDED, Disp: 10 tablet, Rfl: 3   Semaglutide-Weight Management (WEGOVY) 1.7 MG/0.75ML SOAJ, Inject 1.7 mg into the skin once a week., Disp: 4 mL, Rfl: 3  Semaglutide-Weight Management (WEGOVY) 2.4 MG/0.75ML SOAJ, Inject 2.4 mg into the skin once a week., Disp: 3 mL, Rfl: 3   VUMERITY 231 MG CPDR, Take 462 mg by mouth in the morning and at bedtime., Disp: 120 capsule, Rfl: 11   zolpidem (AMBIEN) 10 MG tablet, TAKE 1/2 TO 1 TABLET BY MOUTH AT BEDTIME, Disp: 30 tablet, Rfl: 5   zolpidem (AMBIEN) 10 MG tablet, Take 0.5-1 tablets (5-10 mg total) by mouth at bedtime., Disp: 30 tablet, Rfl: 5   esomeprazole (NEXIUM) 40 MG capsule, TAKE 1 CAPSULE BY MOUTH TWICE DAILY 30 MINUTES BEFORE BREAKFAST AND DINNER. (Patient taking differently: Take 40 mg by mouth as needed.), Disp: 60 capsule, Rfl: 11  PAST MEDICAL HISTORY: Past Medical History:  Diagnosis Date   Asthma    Lichen planus    MS (multiple sclerosis) (HCC)    PCOS (polycystic ovarian syndrome)    Raynaud's disease     PAST SURGICAL HISTORY: Past Surgical History:  Procedure Laterality Date   colonoscopy     DILATION AND CURETTAGE OF UTERUS     x 3   ESOPHAGOGASTRODUODENOSCOPY     TONSILLECTOMY AND ADENOIDECTOMY      FAMILY HISTORY: Family History  Problem Relation Age of Onset   Diabetes Mother    Thyroid disease Mother     Hypertension Mother    Hypercholesterolemia Mother    Migraines Mother    Depression Mother    Diabetes Father    Thyroid disease Father    CAD Father    Hypercholesterolemia Father    Hypertension Father    Depression Father    Pernicious anemia Father    Breast cancer Paternal Aunt     SOCIAL HISTORY:  Social History   Socioeconomic History   Marital status: Married    Spouse name: Not on file   Number of children: 0   Years of education: college   Highest education level: Professional school degree (e.g., MD, DDS, DVM, JD)  Occupational History   Occupation: physician  Tobacco Use   Smoking status: Never   Smokeless tobacco: Never  Vaping Use   Vaping Use: Never used  Substance and Sexual Activity   Alcohol use: No   Drug use: No   Sexual activity: Not on file  Other Topics Concern   Not on file  Social History Narrative   Right handed   1-2 cups caffeine daily   Lives at home with husband   Social Determinants of Health   Financial Resource Strain: Not on file  Food Insecurity: Not on file  Transportation Needs: Not on file  Physical Activity: Not on file  Stress: Not on file  Social Connections: Not on file  Intimate Partner Violence: Not on file     PHYSICAL EXAM  Vitals:   11/21/21 0808  BP: 108/73  Pulse: 76  Weight: 132 lb 6.4 oz (60.1 kg)  Height: '5\' 5"'$  (1.651 m)    Body mass index is 22.03 kg/m.   General: The patient is well-developed and well-nourished and in no acute distress  HEENT:  Head is [place]/AT.  Sclera are anicteric.    Skin: Extremities are without rash or  edema.  Neurologic Exam  Mental status: The patient is alert and oriented x 3 at the time of the examination. The patient has apparent normal recent and remote memory, with an apparently normal attention span and concentration ability.   Speech is normal.  Cranial nerves: Extraocular movements are full.  Facial strength is symmetric.  No obvious hearing deficits  are noted.  Motor:  Muscle bulk is normal.   Tone is normal. Strength is  5 / 5 in all 4 extremities.   Sensory: She had normal sensation to touch and vibration in the arms and legs.    Coordination: Cerebellar testing reveals good finger-nose-finger and heel-to-shin bilaterally.  Gait and station: Station is normal.   Gait is normal.  The tandem gait is slightly wide.  Romberg is normal. Reflexes: Deep tendon reflexes are symmetric and normal bilaterally.        DIAGNOSTIC DATA (LABS, IMAGING, TESTING) - I reviewed patient records, labs, notes, testing and imaging myself where available.  Lab Results  Component Value Date   WBC 3.8 (L) 05/16/2021   HGB 12.5 05/16/2021   HCT 39.8 05/16/2021   MCV 73.7 (L) 05/16/2021   PLT 262 05/16/2021      Component Value Date/Time   NA 138 10/12/2019 0005   K 3.6 10/12/2019 0005   CL 106 10/12/2019 0005   CO2 21 (L) 10/12/2019 0005   GLUCOSE 125 (H) 10/12/2019 0005   BUN 10 10/12/2019 0005   CREATININE 0.84 10/12/2019 0005   CALCIUM 8.6 (L) 10/12/2019 0005   PROT 6.2 (L) 10/12/2019 0005   ALBUMIN 3.6 10/12/2019 0005   AST 21 10/12/2019 0005   ALT 30 10/12/2019 0005   ALKPHOS 41 10/12/2019 0005   BILITOT 0.4 10/12/2019 0005   GFRNONAA >60 10/12/2019 0005   GFRAA >60 10/12/2019 0005       ASSESSMENT AND PLAN  Multiple sclerosis ([place]) - Plan: CBC with Differential/Platelet, Comprehensive metabolic panel  High risk medication use - Plan: CBC with Differential/Platelet, Comprehensive metabolic panel  Migraine without aura and with status migrainosus, not intractable  Insomnia, unspecified type  Dysesthesia  Raynaud's disease without gangrene   1.  Continue Vumerity.  We will recheck the lymphocyte count.  If it is 0.6 or lower we will need to adjust the dose or consider a different DMT.   2.  Trial of Emgality for migraines. Maxalt prn.  If this helps a lot we could consider a prescription. 3.   Continue prn Ambien.   We  discussed ramelteon but is not covered by her plan.  Another option would be mirtazapine.  I got the message from the pharmacy after she left the office and we will message her to go over the options.Madaline Brilliant to continue doxepin for sleep maintenance insomnia (if she goes on mirtazapine we will stop this).  We also discussed a low-dose benzo like temazepam as another option.  4.   Return to see me in 6 months or sooner for new or worsening neurologic symptoms.   Daisy Mcneel A. Felecia Shelling, MD, [facility] 1/76/1607, 3:71 AM Certified in Neurology, Clinical Neurophysiology, Sleep Medicine and Neuroimaging  [facility] [address] [place], [place] [address] [phone]

## 2021-11-22 ENCOUNTER — Other Ambulatory Visit (HOSPITAL_COMMUNITY): Payer: Self-pay

## 2021-11-22 ENCOUNTER — Other Ambulatory Visit: Payer: Self-pay | Admitting: Neurology

## 2021-11-22 LAB — CBC WITH DIFFERENTIAL/PLATELET
Basophils Absolute: 0 10*3/uL (ref 0.0–0.2)
Basos: 1 %
EOS (ABSOLUTE): 0.1 10*3/uL (ref 0.0–0.4)
Eos: 1 %
Hematocrit: 42.8 % (ref 34.0–46.6)
Hemoglobin: 12.8 g/dL (ref 11.1–15.9)
Immature Grans (Abs): 0 10*3/uL (ref 0.0–0.1)
Immature Granulocytes: 1 %
Lymphocytes Absolute: 1 10*3/uL (ref 0.7–3.1)
Lymphs: 24 %
MCH: 23.7 pg — ABNORMAL LOW (ref 26.6–33.0)
MCHC: 29.9 g/dL — ABNORMAL LOW (ref 31.5–35.7)
MCV: 79 fL (ref 79–97)
Monocytes Absolute: 0.5 10*3/uL (ref 0.1–0.9)
Monocytes: 12 %
Neutrophils Absolute: 2.6 10*3/uL (ref 1.4–7.0)
Neutrophils: 61 %
Platelets: 342 10*3/uL (ref 150–450)
RBC: 5.41 x10E6/uL — ABNORMAL HIGH (ref 3.77–5.28)
RDW: 14 % (ref 11.7–15.4)
WBC: 4.2 10*3/uL (ref 3.4–10.8)

## 2021-11-22 LAB — COMPREHENSIVE METABOLIC PANEL
ALT: 13 IU/L (ref 0–32)
AST: 12 IU/L (ref 0–40)
Albumin/Globulin Ratio: 2.2 (ref 1.2–2.2)
Albumin: 4.4 g/dL (ref 3.9–4.9)
Alkaline Phosphatase: 45 IU/L (ref 44–121)
BUN/Creatinine Ratio: 20 (ref 9–23)
BUN: 14 mg/dL (ref 6–24)
Bilirubin Total: 0.5 mg/dL (ref 0.0–1.2)
CO2: 24 mmol/L (ref 20–29)
Calcium: 9.3 mg/dL (ref 8.7–10.2)
Chloride: 103 mmol/L (ref 96–106)
Creatinine, Ser: 0.69 mg/dL (ref 0.57–1.00)
Globulin, Total: 2 g/dL (ref 1.5–4.5)
Glucose: 78 mg/dL (ref 70–99)
Potassium: 5.1 mmol/L (ref 3.5–5.2)
Sodium: 142 mmol/L (ref 134–144)
Total Protein: 6.4 g/dL (ref 6.0–8.5)
eGFR: 112 mL/min/{1.73_m2} (ref >59)

## 2021-11-22 LAB — VITAMIN D 25 HYDROXY (VIT D DEFICIENCY, FRACTURES): Vit D, 25-Hydroxy: 53.9 ng/mL (ref 30.0–100.0)

## 2021-11-22 MED ORDER — ZOLPIDEM TARTRATE ER 6.25 MG PO TBCR
6.2500 mg | EXTENDED_RELEASE_TABLET | Freq: Every evening | ORAL | 5 refills | Status: DC | PRN
  Filled 2021-11-22: qty 30, 30d supply, fill #0
  Filled 2021-12-22: qty 30, 30d supply, fill #1
  Filled 2022-01-21: qty 30, 30d supply, fill #2
  Filled 2022-02-20: qty 30, 30d supply, fill #3
  Filled 2022-03-22: qty 30, 30d supply, fill #4
  Filled 2022-04-26: qty 30, 30d supply, fill #5

## 2021-11-23 ENCOUNTER — Other Ambulatory Visit (HOSPITAL_COMMUNITY): Payer: Self-pay

## 2021-11-23 MED ORDER — INFLUENZA VAC SPLIT QUAD 0.5 ML IM SUSY
0.5000 mL | PREFILLED_SYRINGE | INTRAMUSCULAR | 0 refills | Status: DC
  Filled 2021-11-23: qty 0.5, 1d supply, fill #0

## 2021-11-23 MED ORDER — WEGOVY 2.4 MG/0.75ML ~~LOC~~ SOAJ: 2.4000 mg | SUBCUTANEOUS | 0 refills | Status: DC

## 2021-12-05 ENCOUNTER — Other Ambulatory Visit: Payer: Self-pay | Admitting: *Deleted

## 2021-12-05 ENCOUNTER — Other Ambulatory Visit: Payer: Self-pay | Admitting: Neurology

## 2021-12-05 ENCOUNTER — Encounter: Payer: Self-pay | Admitting: Neurology

## 2021-12-05 DIAGNOSIS — G35 Multiple sclerosis: Secondary | ICD-10-CM

## 2021-12-05 MED ORDER — VUMERITY 231 MG PO CPDR: 462.0000 mg | DELAYED_RELEASE_CAPSULE | Freq: Two times a day (BID) | ORAL | 11 refills | Status: DC

## 2021-12-05 NOTE — Progress Notes (Unsigned)
  Martha [name] [place] [address] [place] Phone: [phone] Subjective:   Martha [name], am serving as a scribe for Dr. Hulan Saas.  I'm seeing this patient by the request  of:  Patient, No Pcp Per  CC: Back and neck pain follow-up  [name]  Martha [name] is a 41 y.o. female coming in with complaint of back and neck pain. OMT 10/18/2021. Patient states doing well today. Here for manipulation. No new complaints.  Medications patient has been prescribed: None  Taking:         Reviewed prior external information including notes and imaging from previsou exam, outside providers and external EMR if available.   As well as notes that were available from care everywhere and other healthcare systems.  Past medical history, social, surgical and family history all reviewed in electronic medical record.  No pertanent information unless stated regarding to the chief complaint.   Past Medical History:  Diagnosis Date   Asthma    Lichen planus    MS (multiple sclerosis) (HCC)    PCOS (polycystic ovarian syndrome)    Raynaud's disease     No Known Allergies   Review of Systems:  No headache, visual changes, nausea, vomiting, diarrhea, constipation, dizziness, abdominal pain, skin rash, fevers, chills, night sweats, weight loss, swollen lymph nodes, body aches, joint swelling, chest pain, shortness of breath, mood changes. POSITIVE muscle aches  Objective  Blood pressure 122/78, height '5\' 5"'$  (1.651 m), weight 136 lb (61.7 kg).   General: No apparent distress alert and oriented x3 mood and affect normal, dressed appropriately.  HEENT: Pupils equal, extraocular movements intact  Respiratory: Patient's speak in full sentences and does not appear short of breath  Cardiovascular: No lower extremity edema, non tender, no erythema  Gait MSK:  Back low back exam does have some mild tightness in this thoracolumbar junction.  Tightness  in hip flexors noted.  Osteopathic findings  T3 extended rotated and side bent right inhaled rib T6 extended rotated and side bent left L1 flexed rotated and side bent right Sacrum right on right       Assessment and Plan:  Thoracic back pain Mostly postural related.  Discussed more of the strengthening aspects at this time.  Discussed ergonomics throughout the day.  Still responding extremely well though to osteopathic manipulation.  Follow-up with me again in 6 to 8 weeks    Nonallopathic problems  Decision today to treat with OMT was based on Physical Exam  After verbal consent patient was treated with HVLA, ME, FPR techniques in  rib, thoracic, lumbar, and sacral  areas  Patient tolerated the procedure well with improvement in symptoms  Patient given exercises, stretches and lifestyle modifications  See medications in patient instructions if given  Patient will follow up in 4-8 weeks    The above documentation has been reviewed and is accurate and complete Martha Pulley, DO          Note: This dictation was prepared with Dragon dictation along with smaller phrase technology. Any transcriptional errors that result from this process are unintentional.

## 2021-12-06 ENCOUNTER — Encounter: Payer: Self-pay | Admitting: Family Medicine

## 2021-12-06 ENCOUNTER — Ambulatory Visit (INDEPENDENT_AMBULATORY_CARE_PROVIDER_SITE_OTHER): Payer: 59 | Admitting: Family Medicine

## 2021-12-06 VITALS — BP 122/78 | Ht 65.0 in | Wt 136.0 lb

## 2021-12-06 DIAGNOSIS — M9904 Segmental and somatic dysfunction of sacral region: Secondary | ICD-10-CM | POA: Diagnosis not present

## 2021-12-06 DIAGNOSIS — M9903 Segmental and somatic dysfunction of lumbar region: Secondary | ICD-10-CM | POA: Diagnosis not present

## 2021-12-06 DIAGNOSIS — M546 Pain in thoracic spine: Secondary | ICD-10-CM | POA: Diagnosis not present

## 2021-12-06 DIAGNOSIS — M9902 Segmental and somatic dysfunction of thoracic region: Secondary | ICD-10-CM

## 2021-12-06 DIAGNOSIS — G8929 Other chronic pain: Secondary | ICD-10-CM | POA: Diagnosis not present

## 2021-12-06 DIAGNOSIS — M9908 Segmental and somatic dysfunction of rib cage: Secondary | ICD-10-CM

## 2021-12-06 NOTE — Assessment & Plan Note (Signed)
Mostly postural related.  Discussed more of the strengthening aspects at this time.  Discussed ergonomics throughout the day.  Still responding extremely well though to osteopathic manipulation.  Follow-up with me again in 6 to 8 weeks

## 2021-12-06 NOTE — Patient Instructions (Signed)
Good to see you   

## 2021-12-11 ENCOUNTER — Other Ambulatory Visit (HOSPITAL_COMMUNITY): Payer: Self-pay

## 2021-12-21 ENCOUNTER — Other Ambulatory Visit (HOSPITAL_COMMUNITY): Payer: Self-pay

## 2021-12-22 ENCOUNTER — Other Ambulatory Visit (HOSPITAL_COMMUNITY): Payer: Self-pay

## 2022-01-03 NOTE — Progress Notes (Signed)
  [place] Phone: [phone] Subjective:    I'm seeing this patient by the request  of:  Patient, No Pcp Per  CC: Back and neck pain follow-up  [id number]  Martha [name] is a 41 y.o. female coming in with complaint of back and neck pain. OMT 12/06/2021. Patient states here for routine OMT.  Medications patient has been prescribed: None  Taking: Nothing that we prescribed the patient is taking Mancel Parsons still         Reviewed prior external information including notes and imaging from previsou exam, outside providers and external EMR if available.   As well as notes that were available from care everywhere and other healthcare systems.  Past medical history, social, surgical and family history all reviewed in electronic medical record.  No pertanent information unless stated regarding to the chief complaint.   Past Medical History:  Diagnosis Date   Asthma    Lichen planus    MS (multiple sclerosis) (HCC)    PCOS (polycystic ovarian syndrome)    Raynaud's disease     No Known Allergies   Review of Systems:  No headache, visual changes, nausea, vomiting, diarrhea, constipation, dizziness, abdominal pain, skin rash, fevers, chills, night sweats, weight loss, swollen lymph nodes, body aches, joint swelling, chest pain, shortness of breath, mood changes. POSITIVE muscle aches  Objective  Blood pressure 100/60, pulse 80, height '5\' 5"'$  (1.651 m), weight 135 lb (61.2 kg), SpO2 99 %.   General: No apparent distress alert and oriented x3 mood and affect normal, dressed appropriately.  HEENT: Pupils equal, extraocular movements intact  Respiratory: Patient's speak in full sentences and does not appear short of breath  Cardiovascular: No lower extremity edema, non tender, no erythema  Gait normal  MSK:  Back mild loss of lordosis.  Patient does have more tightness in the thoracolumbar juncture right  greater than left.  Tightness in the right parascapular area.  Left hip seems to be more discomfort though.  Osteopathic findings  C2 flexed rotated and side bent right C5 flexed rotated and side bent left T3 extended rotated and side bent left inhaled rib T9 extended rotated and side bent left inhaled rib L1 flexed rotated and side bent left Sacrum right on right       Assessment and Plan:  Thoracic back pain Thoracic back pain.  Discussed icing regimen and home exercises which activities to do things to avoid.  Patient is still working on some strengthening.  Does have things where she does like housework that seems to make it more aggravated.  Still has been significantly better since the weight loss.  Follow-up with me again in 6 to 8 weeks    Nonallopathic problems  Decision today to treat with OMT was based on Physical Exam  After verbal consent patient was treated with HVLA, ME, FPR techniques in cervical, rib, thoracic, lumbar, and sacral  areas  Patient tolerated the procedure well with improvement in symptoms  Patient given exercises, stretches and lifestyle modifications  See medications in patient instructions if given  Patient will follow up in 4-8 weeks     The above documentation has been reviewed and is accurate and complete Lyndal Pulley, DO         Note: This dictation was prepared with Dragon dictation along with smaller phrase technology. Any transcriptional errors that result from this process are unintentional.

## 2022-01-08 ENCOUNTER — Ambulatory Visit (INDEPENDENT_AMBULATORY_CARE_PROVIDER_SITE_OTHER): Payer: 59 | Admitting: Family Medicine

## 2022-01-08 VITALS — BP 100/60 | HR 80 | Ht 65.0 in | Wt 135.0 lb

## 2022-01-08 DIAGNOSIS — M9902 Segmental and somatic dysfunction of thoracic region: Secondary | ICD-10-CM | POA: Diagnosis not present

## 2022-01-08 DIAGNOSIS — M9901 Segmental and somatic dysfunction of cervical region: Secondary | ICD-10-CM | POA: Diagnosis not present

## 2022-01-08 DIAGNOSIS — M546 Pain in thoracic spine: Secondary | ICD-10-CM

## 2022-01-08 DIAGNOSIS — M9903 Segmental and somatic dysfunction of lumbar region: Secondary | ICD-10-CM | POA: Diagnosis not present

## 2022-01-08 DIAGNOSIS — M9904 Segmental and somatic dysfunction of sacral region: Secondary | ICD-10-CM | POA: Diagnosis not present

## 2022-01-08 DIAGNOSIS — G8929 Other chronic pain: Secondary | ICD-10-CM | POA: Diagnosis not present

## 2022-01-08 DIAGNOSIS — M9908 Segmental and somatic dysfunction of rib cage: Secondary | ICD-10-CM | POA: Diagnosis not present

## 2022-01-08 NOTE — Assessment & Plan Note (Signed)
Thoracic back pain.  Discussed icing regimen and home exercises which activities to do things to avoid.  Patient is still working on some strengthening.  Does have things where she does like housework that seems to make it more aggravated.  Still has been significantly better since the weight loss.  Follow-up with me again in 6 to 8 weeks

## 2022-01-19 ENCOUNTER — Other Ambulatory Visit (HOSPITAL_COMMUNITY): Payer: Self-pay

## 2022-01-22 ENCOUNTER — Other Ambulatory Visit (HOSPITAL_COMMUNITY): Payer: Self-pay

## 2022-02-06 ENCOUNTER — Ambulatory Visit: Payer: 59 | Admitting: Family Medicine

## 2022-02-08 NOTE — Progress Notes (Signed)
[name] Phone: [phone] Subjective:   [name], am serving as a scribe for Dr. Hulan Saas.  I'm seeing this patient by the request  of:  Patient, No Pcp Per  CC: Back and neck pain follow-up  [id number]  Martha [name] is a 41 y.o. female coming in with complaint of back and neck pain. OMT 01/08/2022. Patient states that she is having more upper back pain than last visit.   Medications patient has been prescribed: none          Reviewed prior external information including notes and imaging from previsou exam, outside providers and external EMR if available.   As well as notes that were available from care everywhere and other healthcare systems.  Past medical history, social, surgical and family history all reviewed in electronic medical record.  No pertanent information unless stated regarding to the chief complaint.   Past Medical History:  Diagnosis Date   Asthma    Lichen planus    MS (multiple sclerosis) (HCC)    PCOS (polycystic ovarian syndrome)    Raynaud's disease     No Known Allergies   Review of Systems:  No headache, visual changes, nausea, vomiting, diarrhea, constipation, dizziness, abdominal pain, skin rash, fevers, chills, night sweats, weight loss, swollen lymph nodes, body aches, joint swelling, chest pain, shortness of breath, mood changes. POSITIVE muscle aches  Objective  Blood pressure 104/68, pulse 68, height '5\' 5"'$  (1.651 m), weight 134 lb (60.8 kg), SpO2 98 %.   General: No apparent distress alert and oriented x3 mood and affect normal, dressed appropriately.  HEENT: Pupils equal, extraocular movements intact  Respiratory: Patient's speak in full sentences and does not appear short of breath  Cardiovascular: No lower extremity edema, non tender, no erythema  Low back exam does have some loss of lordosis.  Patient does have some hypermobility noted of  other joints.  Patient does have tightness mostly in the parascapular region.  Seems to be more on the right in the thoracolumbar juncture as well.  For little different than usual for patient.  Osteopathic findings  C2 flexed rotated and side bent right C4 flexed rotated and side bent left C6 flexed rotated and side bent left T3 extended rotated and side bent right inhaled rib T11 extended rotated and side bent right L1 flexed rotated and side bent right Sacrum right on right       Assessment and Plan:  Thoracic back pain Chronic discomfort.  Has seemed to be exacerbated from repetitive activity.  Discussed icing regimen and home exercises.  Discussed which activities to do and which ones to avoid.  Increase activity slowly over the course the next several days in 2 weeks.  Follow-up with me again in 6 to 8 weeks refilled muscle relaxer for chronic pain.    Nonallopathic problems  Decision today to treat with OMT was based on Physical Exam  After verbal consent patient was treated with HVLA, ME, FPR techniques in cervical, rib, thoracic, lumbar, and sacral  areas  Patient tolerated the procedure well with improvement in symptoms  Patient given exercises, stretches and lifestyle modifications  See medications in patient instructions if given  Patient will follow up in 4-8 weeks    The above documentation has been reviewed and is accurate and complete Lyndal Pulley, DO          Note: This dictation was prepared with Dragon dictation along  with smaller phrase technology. Any transcriptional errors that result from this process are unintentional.

## 2022-02-12 ENCOUNTER — Other Ambulatory Visit (HOSPITAL_COMMUNITY): Payer: Self-pay

## 2022-02-12 DIAGNOSIS — N979 Female infertility, unspecified: Secondary | ICD-10-CM | POA: Diagnosis not present

## 2022-02-12 DIAGNOSIS — Z0001 Encounter for general adult medical examination with abnormal findings: Secondary | ICD-10-CM | POA: Diagnosis not present

## 2022-02-12 DIAGNOSIS — Z6822 Body mass index (BMI) 22.0-22.9, adult: Secondary | ICD-10-CM | POA: Diagnosis not present

## 2022-02-12 DIAGNOSIS — E669 Obesity, unspecified: Secondary | ICD-10-CM | POA: Diagnosis not present

## 2022-02-12 DIAGNOSIS — G43909 Migraine, unspecified, not intractable, without status migrainosus: Secondary | ICD-10-CM | POA: Diagnosis not present

## 2022-02-12 DIAGNOSIS — K219 Gastro-esophageal reflux disease without esophagitis: Secondary | ICD-10-CM | POA: Diagnosis not present

## 2022-02-12 MED ORDER — ESOMEPRAZOLE MAGNESIUM 40 MG PO CPDR
40.0000 mg | DELAYED_RELEASE_CAPSULE | Freq: Every day | ORAL | 1 refills | Status: DC
  Filled 2022-02-12: qty 90, 90d supply, fill #0

## 2022-02-12 MED ORDER — WEGOVY 2.4 MG/0.75ML ~~LOC~~ SOAJ
2.4000 mg | SUBCUTANEOUS | 1 refills | Status: DC
  Filled 2022-02-12: qty 9, 84d supply, fill #0
  Filled 2022-05-23: qty 3, 28d supply, fill #1

## 2022-02-14 ENCOUNTER — Ambulatory Visit: Payer: 59 | Admitting: Family Medicine

## 2022-02-14 ENCOUNTER — Other Ambulatory Visit (HOSPITAL_COMMUNITY): Payer: Self-pay

## 2022-02-14 ENCOUNTER — Ambulatory Visit (INDEPENDENT_AMBULATORY_CARE_PROVIDER_SITE_OTHER): Payer: 59 | Admitting: Family Medicine

## 2022-02-14 VITALS — BP 104/68 | HR 68 | Ht 65.0 in | Wt 134.0 lb

## 2022-02-14 DIAGNOSIS — M9904 Segmental and somatic dysfunction of sacral region: Secondary | ICD-10-CM | POA: Diagnosis not present

## 2022-02-14 DIAGNOSIS — M9908 Segmental and somatic dysfunction of rib cage: Secondary | ICD-10-CM

## 2022-02-14 DIAGNOSIS — G8929 Other chronic pain: Secondary | ICD-10-CM | POA: Diagnosis not present

## 2022-02-14 DIAGNOSIS — M9901 Segmental and somatic dysfunction of cervical region: Secondary | ICD-10-CM | POA: Diagnosis not present

## 2022-02-14 DIAGNOSIS — M9903 Segmental and somatic dysfunction of lumbar region: Secondary | ICD-10-CM | POA: Diagnosis not present

## 2022-02-14 DIAGNOSIS — M546 Pain in thoracic spine: Secondary | ICD-10-CM

## 2022-02-14 DIAGNOSIS — M9902 Segmental and somatic dysfunction of thoracic region: Secondary | ICD-10-CM

## 2022-02-14 MED ORDER — TIZANIDINE HCL 4 MG PO TABS
4.0000 mg | ORAL_TABLET | Freq: Every evening | ORAL | 2 refills | Status: AC
  Filled 2022-02-14: qty 30, 30d supply, fill #0
  Filled 2022-07-26: qty 30, 30d supply, fill #1
  Filled 2023-02-08: qty 30, 30d supply, fill #2

## 2022-02-14 NOTE — Assessment & Plan Note (Signed)
Chronic discomfort.  Has seemed to be exacerbated from repetitive activity.  Discussed icing regimen and home exercises.  Discussed which activities to do and which ones to avoid.  Increase activity slowly over the course the next several days in 2 weeks.  Follow-up with me again in 6 to 8 weeks refilled muscle relaxer for chronic pain.

## 2022-02-14 NOTE — Patient Instructions (Addendum)
Good to see you Happy Holidays See me in 6-8 weeks

## 2022-02-20 ENCOUNTER — Other Ambulatory Visit (HOSPITAL_COMMUNITY): Payer: Self-pay

## 2022-02-20 DIAGNOSIS — L239 Allergic contact dermatitis, unspecified cause: Secondary | ICD-10-CM | POA: Diagnosis not present

## 2022-02-20 MED ORDER — CLOBETASOL PROPIONATE 0.05 % EX CREA
1.0000 | TOPICAL_CREAM | Freq: Two times a day (BID) | CUTANEOUS | 0 refills | Status: DC
  Filled 2022-02-20: qty 30, 30d supply, fill #0

## 2022-02-20 MED ORDER — PREDNISONE 20 MG PO TABS
20.0000 mg | ORAL_TABLET | Freq: Every day | ORAL | 0 refills | Status: DC
  Filled 2022-02-20: qty 6, 6d supply, fill #0

## 2022-02-20 MED ORDER — HYDROXYZINE HCL 50 MG PO TABS
50.0000 mg | ORAL_TABLET | Freq: Every evening | ORAL | 0 refills | Status: DC
  Filled 2022-02-20: qty 10, 10d supply, fill #0

## 2022-02-27 ENCOUNTER — Other Ambulatory Visit (HOSPITAL_COMMUNITY): Payer: Self-pay

## 2022-02-27 MED ORDER — PREDNISONE 5 MG PO TABS
ORAL_TABLET | ORAL | 0 refills | Status: AC
  Filled 2022-02-27: qty 21, 6d supply, fill #0

## 2022-02-27 MED ORDER — HYDROXYZINE HCL 25 MG PO TABS
25.0000 mg | ORAL_TABLET | Freq: Four times a day (QID) | ORAL | 2 refills | Status: DC | PRN
Start: 2022-02-27 — End: 2022-05-28
  Filled 2022-02-27: qty 28, 7d supply, fill #0

## 2022-03-09 ENCOUNTER — Other Ambulatory Visit (HOSPITAL_COMMUNITY): Payer: Self-pay

## 2022-03-15 NOTE — Progress Notes (Signed)
[name] [place] Phone: [phone] Subjective:   Martha [name], am serving as a scribe for Dr. Hulan Saas.  I'm seeing this patient by the request  of:  Patient, No Pcp Per  CC: back and neck pain   [id number]  Martha [name] is a 42 y.o. female coming in with complaint of back and neck pain. OMT 02/14/2022. Patient states her back is "always Jacked up."  .  Stress Limited secondary to the just got a text that her mother was going to the emergency department in [place]  Medications patient has been prescribed: Zanaflex  Taking: Zanaflex          Reviewed prior external information including notes and imaging from previsou exam, outside providers and external EMR if available.   As well as notes that were available from care everywhere and other healthcare systems.  Past medical history, social, surgical and family history all reviewed in electronic medical record.  No pertanent information unless stated regarding to the chief complaint.   Past Medical History:  Diagnosis Date   Asthma    Lichen planus    MS (multiple sclerosis) (HCC)    PCOS (polycystic ovarian syndrome)    Raynaud's disease     No Known Allergies   Review of Systems:  No headache, visual changes, nausea, vomiting, diarrhea, constipation, dizziness, abdominal pain, skin rash, fevers, chills, night sweats, weight loss, swollen lymph nodes, body aches, joint swelling, chest pain, shortness of breath, mood changes. POSITIVE muscle aches  Objective  Blood pressure 118/80, pulse 84, height '5\' 5"'$  (1.651 m), weight 133 lb (60.3 kg), SpO2 98 %.   General: No apparent distress alert and oriented x3 mood and affect normal, dressed appropriately.  HEENT: Pupils equal, extraocular movements intact  Respiratory: Patient's speak in full sentences and does not appear short of breath  Cardiovascular: No lower extremity edema, non tender,  no erythema  Back and neck pain noted on exam today.  Some increasing tightness noted mostly of the cervical spine and some of the lumbar spine but nothing extremely severe.  Osteopathic findings  C3 flexed rotated and side bent right C6 flexed rotated and side bent right T3 extended rotated and side bent right inhaled rib T7 extended rotated and side bent right L2 flexed rotated and side bent right Sacrum right on right       Assessment and Plan:  Thoracic back pain Thoracic back pain still noted at this time.  I still do not see any signs of flares of multiple sclerosis at this moment.  Discussed posture and ergonomics.  Patient is dealing with a lot of stress that we will continue to monitor as well.  Patient will follow-up with me again in 4 to 6 weeks    Nonallopathic problems  Decision today to treat with OMT was based on Physical Exam  After verbal consent patient was treated with HVLA, ME, FPR techniques in cervical, rib, thoracic, lumbar, and sacral  areas  Patient tolerated the procedure well with improvement in symptoms  Patient given exercises, stretches and lifestyle modifications  See medications in patient instructions if given  Patient will follow up in 4-8 weeks    The above documentation has been reviewed and is accurate and complete Lyndal Pulley, DO          Note: This dictation was prepared with Dragon dictation along with smaller phrase technology. Any transcriptional errors that result from this  process are unintentional.

## 2022-03-21 ENCOUNTER — Ambulatory Visit (INDEPENDENT_AMBULATORY_CARE_PROVIDER_SITE_OTHER): Payer: 59 | Admitting: Family Medicine

## 2022-03-21 VITALS — BP 118/80 | HR 84 | Ht 65.0 in | Wt 133.0 lb

## 2022-03-21 DIAGNOSIS — M9904 Segmental and somatic dysfunction of sacral region: Secondary | ICD-10-CM

## 2022-03-21 DIAGNOSIS — M9901 Segmental and somatic dysfunction of cervical region: Secondary | ICD-10-CM

## 2022-03-21 DIAGNOSIS — M546 Pain in thoracic spine: Secondary | ICD-10-CM

## 2022-03-21 DIAGNOSIS — M9902 Segmental and somatic dysfunction of thoracic region: Secondary | ICD-10-CM | POA: Diagnosis not present

## 2022-03-21 DIAGNOSIS — G8929 Other chronic pain: Secondary | ICD-10-CM

## 2022-03-21 DIAGNOSIS — M9903 Segmental and somatic dysfunction of lumbar region: Secondary | ICD-10-CM | POA: Diagnosis not present

## 2022-03-21 DIAGNOSIS — M9908 Segmental and somatic dysfunction of rib cage: Secondary | ICD-10-CM

## 2022-03-21 NOTE — Patient Instructions (Addendum)
Good to see you   Follow up in 6 weeks

## 2022-03-21 NOTE — Assessment & Plan Note (Signed)
Thoracic back pain still noted at this time.  I still do not see any signs of flares of multiple sclerosis at this moment.  Discussed posture and ergonomics.  Patient is dealing with a lot of stress that we will continue to monitor as well.  Patient will follow-up with me again in 4 to 6 weeks

## 2022-03-23 ENCOUNTER — Other Ambulatory Visit: Payer: Self-pay

## 2022-04-27 ENCOUNTER — Other Ambulatory Visit (HOSPITAL_COMMUNITY): Payer: Self-pay

## 2022-04-27 ENCOUNTER — Other Ambulatory Visit: Payer: Self-pay

## 2022-04-30 NOTE — Progress Notes (Unsigned)
[place] Phone: [phone] Subjective:   Martha [name], am serving as a scribe for Dr. Hulan Saas.  I'm seeing this patient by the request  of:  Patient, [name]  CC: Back pain follow-up  [id number]  Martha [name] is a 42 y.o. female coming in with complaint of back and neck pain. OMT 03/21/2022. Patient states that she is the same as last visit.  States that it continues to give her some discomfort.  Still seems to be in the mid back not stopping her from activity but does notice when she is in 1 position a long time can have more difficulty.  Medications patient has been prescribed: Zanaflex  Taking:         Reviewed prior external information including notes and imaging from previsou exam, outside providers and external EMR if available.   As well as notes that were available from care everywhere and other healthcare systems.  Past medical history, social, surgical and family history all reviewed in electronic medical record.  [name] pertanent information unless stated regarding to the chief complaint.   Past Medical History:  Diagnosis Date   Asthma    Lichen planus    MS (multiple sclerosis) (HCC)    PCOS (polycystic ovarian syndrome)    Raynaud's disease     [name] Known Allergies   Review of Systems:  [name] headache, visual changes, nausea, vomiting, diarrhea, constipation, dizziness, abdominal pain, skin rash, fevers, chills, night sweats, weight loss, swollen lymph nodes, body aches, joint swelling, chest pain, shortness of breath, mood changes. POSITIVE muscle aches  Objective  Blood pressure 110/76, height '5\' 5"'$  (1.651 m), weight 133 lb (60.3 kg).   General: [name] apparent distress alert and oriented x3 mood and affect normal, dressed appropriately.  HEENT: Pupils equal, extraocular movements intact  Respiratory: Patient's speak in full sentences and does not appear short of breath   Cardiovascular: [name] lower extremity edema, non tender, [name] erythema  Tightness noted at the cervical thoracic and thoracolumbar junctions bilaterally.  More tenderness than usual.  Right shoulder blade does have some mild scapular dyskinesis and  Osteopathic findings  C2 flexed rotated and side bent right C7 flexed rotated and side bent left T1 extended rotated and side bent right inhaled rib T11 extended rotated and side bent left L2 flexed rotated and side bent right Sacrum right on right       Assessment and Plan:  Thoracic back pain Chronic pulm with mild exacerbation, has been working at home more which ones to avoid, increase activity slowly.  Patient will try to work on transition of some of the other ergonomics that could be beneficial.  Follow-up again in 6 to 8 weeks    Nonallopathic problems  Decision today to treat with OMT was based on Physical Exam  After verbal consent patient was treated with HVLA, ME, FPR techniques in cervical, rib, thoracic, lumbar, and sacral  areas  Patient tolerated the procedure well with improvement in symptoms  Patient given exercises, stretches and lifestyle modifications  See medications in patient instructions if given  Patient will follow up in 4-8 weeks    The above documentation has been reviewed and is accurate and complete Lyndal Pulley, DO          Note: This dictation was prepared with Dragon dictation along with smaller phrase technology. Any transcriptional errors that result from this process are unintentional.

## 2022-05-02 ENCOUNTER — Ambulatory Visit (INDEPENDENT_AMBULATORY_CARE_PROVIDER_SITE_OTHER): Payer: 59 | Admitting: Family Medicine

## 2022-05-02 VITALS — BP 110/76 | Ht 65.0 in | Wt 133.0 lb

## 2022-05-02 DIAGNOSIS — M9903 Segmental and somatic dysfunction of lumbar region: Secondary | ICD-10-CM

## 2022-05-02 DIAGNOSIS — M546 Pain in thoracic spine: Secondary | ICD-10-CM | POA: Diagnosis not present

## 2022-05-02 DIAGNOSIS — M9904 Segmental and somatic dysfunction of sacral region: Secondary | ICD-10-CM | POA: Diagnosis not present

## 2022-05-02 DIAGNOSIS — M9901 Segmental and somatic dysfunction of cervical region: Secondary | ICD-10-CM

## 2022-05-02 DIAGNOSIS — G8929 Other chronic pain: Secondary | ICD-10-CM | POA: Diagnosis not present

## 2022-05-02 DIAGNOSIS — M9902 Segmental and somatic dysfunction of thoracic region: Secondary | ICD-10-CM | POA: Diagnosis not present

## 2022-05-02 DIAGNOSIS — M9908 Segmental and somatic dysfunction of rib cage: Secondary | ICD-10-CM | POA: Diagnosis not present

## 2022-05-02 NOTE — Assessment & Plan Note (Signed)
Chronic pulm with mild exacerbation, has been working at home more which ones to avoid, increase activity slowly.  Patient will try to work on transition of some of the other ergonomics that could be beneficial.  Follow-up again in 6 to 8 weeks

## 2022-05-08 ENCOUNTER — Other Ambulatory Visit (HOSPITAL_COMMUNITY): Payer: Self-pay

## 2022-05-08 DIAGNOSIS — L821 Other seborrheic keratosis: Secondary | ICD-10-CM | POA: Diagnosis not present

## 2022-05-08 DIAGNOSIS — D1801 Hemangioma of skin and subcutaneous tissue: Secondary | ICD-10-CM | POA: Diagnosis not present

## 2022-05-08 DIAGNOSIS — D229 Melanocytic nevi, unspecified: Secondary | ICD-10-CM | POA: Diagnosis not present

## 2022-05-08 DIAGNOSIS — L814 Other melanin hyperpigmentation: Secondary | ICD-10-CM | POA: Diagnosis not present

## 2022-05-08 MED ORDER — TRETINOIN 0.025 % EX CREA
TOPICAL_CREAM | Freq: Every day | CUTANEOUS | 0 refills | Status: DC
  Filled 2022-05-08: qty 20, 30d supply, fill #0

## 2022-05-23 ENCOUNTER — Other Ambulatory Visit (HOSPITAL_COMMUNITY): Payer: Self-pay

## 2022-05-23 MED ORDER — WEGOVY 2.4 MG/0.75ML ~~LOC~~ SOAJ
2.4000 mg | SUBCUTANEOUS | 1 refills | Status: DC
Start: 2022-05-22 — End: 2022-10-22
  Filled 2022-05-23: qty 3, 28d supply, fill #0
  Filled 2022-06-24: qty 3, 28d supply, fill #1
  Filled 2022-07-26 – 2022-10-09 (×2): qty 3, 28d supply, fill #2

## 2022-05-28 ENCOUNTER — Ambulatory Visit (INDEPENDENT_AMBULATORY_CARE_PROVIDER_SITE_OTHER): Payer: 59 | Admitting: Neurology

## 2022-05-28 ENCOUNTER — Other Ambulatory Visit (HOSPITAL_COMMUNITY): Payer: Self-pay

## 2022-05-28 ENCOUNTER — Encounter: Payer: Self-pay | Admitting: Neurology

## 2022-05-28 VITALS — BP 109/72 | HR 95 | Ht 65.0 in | Wt 138.0 lb

## 2022-05-28 DIAGNOSIS — Z79899 Other long term (current) drug therapy: Secondary | ICD-10-CM

## 2022-05-28 DIAGNOSIS — E559 Vitamin D deficiency, unspecified: Secondary | ICD-10-CM | POA: Diagnosis not present

## 2022-05-28 DIAGNOSIS — G35 Multiple sclerosis: Secondary | ICD-10-CM

## 2022-05-28 DIAGNOSIS — I7300 Raynaud's syndrome without gangrene: Secondary | ICD-10-CM | POA: Diagnosis not present

## 2022-05-28 DIAGNOSIS — R208 Other disturbances of skin sensation: Secondary | ICD-10-CM

## 2022-05-28 DIAGNOSIS — G4700 Insomnia, unspecified: Secondary | ICD-10-CM | POA: Diagnosis not present

## 2022-05-28 DIAGNOSIS — G43001 Migraine without aura, not intractable, with status migrainosus: Secondary | ICD-10-CM

## 2022-05-28 MED ORDER — IMIPRAMINE HCL 25 MG PO TABS
25.0000 mg | ORAL_TABLET | Freq: Every day | ORAL | 11 refills | Status: AC
  Filled 2022-05-28 (×2): qty 60, 30d supply, fill #0
  Filled 2022-06-24: qty 60, 30d supply, fill #1
  Filled 2022-07-26: qty 60, 30d supply, fill #2
  Filled 2022-10-09: qty 60, 30d supply, fill #3
  Filled 2023-02-14: qty 60, 30d supply, fill #4

## 2022-05-28 MED ORDER — ZOLPIDEM TARTRATE 10 MG PO TABS
5.0000 mg | ORAL_TABLET | Freq: Every day | ORAL | 5 refills | Status: DC
  Filled 2022-05-28: qty 30, 30d supply, fill #0
  Filled 2022-06-24: qty 30, 30d supply, fill #1
  Filled 2022-07-26: qty 30, 30d supply, fill #2
  Filled 2022-10-09: qty 30, 30d supply, fill #3
  Filled 2022-11-22: qty 30, 30d supply, fill #4

## 2022-05-28 NOTE — Progress Notes (Signed)
[facility]   PATIENT: Martha [name] DOB: 02-10-1981  REFERRING DOCTOR OR PCP:  [name] [facility]) SOURCE: Patient, notes from neurology, laboratory reports, imaging reports, MRI images personally reviewed.  _________________________________   HISTORICAL  CHIEF COMPLAINT:  Chief Complaint  Patient presents with   Room 10    Pt is here Alone. Pt states that she has been having crazy headaches. Pt states that she has a headache now. Pt states that she is having lack of sleep.     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 woman with multiple sclerosis.  Update 05/28/2022:  She is on Vumerity and tolerates it well. She flushes if takes without food.   No GI issues  No exacerbations.     Gait and balance are good on a flat surface.  She had one fall in 2022 on stairs so she usually holds the bannister.  She has mild sensation like water running down calves or vibrating but these are not too bad.   Vision is better than 20/20.   ON was her first exacerbatin in 2009   Color vision is fine   She is noting more headaches - frontal and around eye.  She often wakes up with them but they improve as they wake up.    They occur 2-6 days a week (15+ a month for 4 or more hours).  Sometimes they have migrainous features and she takes a Maxalt.     She has not tried an oral anti-CGRP agent.   She has tried Topamax many years ago with uncertain benefit.    Her sleep quality is often poor but she does not snore.  She has both sleep onset and maintenance issues.   Ambien 6.25 mg helps her fall asleep but she wakes up 4 hours later.   Doxepin sometimes helps her stay asleep and other times not.   Razodone caused nasal congestant and she stopped.    She changed jobs and now is working days only.     Fatigue is doing ok.  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   Vision is fine.  Bladder is fine.   She had ON near the time of diagnosis in 2009.  She saw Dr. [name] recently and her exam was fine.       She has lost 70 pounds on purpose, helped by semaglutide and diet.       She has Raynaud's, left > right hand.  She wears heated socks which helps feet.   She has had this for many years but the episodes are more painful recently.  She generally does worse during winter than summer.   MS History She was diagnosed with MS in 2009 after presenting with right optic neuritis.  She initially saw ophthalmology and was referred to neuro-ophthalmology (Dr. [name]).  After an MRI showed changes consistent with MS, she was referred to Dr. [name], at [facility] neurology.     She was placed on Avonex and tolerated it well.   Due to LFT elevation, she switched to Copaxone.    She had tolerability issues and switched to Tecfidera in 2013.  She had some mild liver elevation.  Her MS was stable on all of these medications.  She stopped Tecfidera in 2017 due to family planning issues.  She has not been able to get pregnant and is ready to restart therapy.  She started Vumerity August 2021.  She has low vitamin D and takes supplements weekly.       Imaging:  MRI of the brain 12/05/2018 showed multiple T2/FLAIR hyperintense foci, predominantly in the periventricular white matter radially oriented to the ventricles.  A couple foci were also noted in the juxtacortical white matter, possibly a couple punctate foci in the pons, deep white matter and one focus in the right cerebellar hemisphere.  None of the foci enhanced.  There were no films for comparison.    She reports a cervical spine MRI in 2013 did not show any plaque.      MRI lumbar in 2015 showed mild facet hypertrophy at a few levels.     MRI brain 11/03/2019 showed multiple T2/FLAIR hyperintense foci in the periventricular, juxtacortical and deep white matter of both hemispheres consistent with  demyelinating plaque associated with multiple sclerosis. Most of the foci were present on the 12/05/2018 MRI. However, there is one new juxtacortical focus in the right hemisphere. That focus also enhances after contrast implying a more acute or subacute timeframe.  MRI cervical spine 11/03/2019 showed a normal spinal cord and no significant DJD.  OTHER: She had Lichen Planus on the left arm and has a sore in her mouth potentially the same diagnosis.  She is going to see an oral surgeon for possible biopsy.   REVIEW OF SYSTEMS: Constitutional: No fevers, chills, sweats, or change in appetite Eyes: No visual changes, double vision, eye pain Ear, nose and throat: No hearing loss, ear pain, nasal congestion, sore throat.  She currently has a sore in her mouth potentially worrisome for lichen planus Cardiovascular: No chest pain, palpitations Respiratory:  No shortness of breath at rest or with exertion.   No wheezes GastrointestinaI: No nausea, vomiting, diarrhea, abdominal pain, fecal incontinence Genitourinary:  No dysuria, urinary retention or frequency.  No nocturia. Musculoskeletal:  No neck pain.  She has some back pain. Integumentary: No rash, pruritus.  She had lichen planus in the left arm Neurological: as above Psychiatric: No depression at this time.  No anxiety Endocrine: No palpitations, diaphoresis, change in appetite, change in weigh or increased thirst Hematologic/Lymphatic:  No anemia, purpura, petechiae. Allergic/Immunologic: No itchy/runny eyes, nasal congestion, recent allergic reactions, rashes  ALLERGIES: No Known Allergies  HOME MEDICATIONS:  Current Outpatient Medications:    amLODipine (NORVASC) 2.5 MG tablet, Take 1 tablet (2.5 mg total) by mouth daily. (Patient taking differently: Take 2.5 mg by mouth daily. Only takes during winter months), Disp: 90 tablet, Rfl: 0   clobetasol cream (TEMOVATE) [id number] %, Apply 1 Application topically 2 (two) times daily for 14  days., Disp: 30 g, Rfl: 0   doxepin (SINEQUAN) 10 MG capsule, Take 1 capsule (10 mg total) by mouth at bedtime., Disp: 90 capsule, Rfl: 3   influenza vac split quadrivalent PF (FLUARIX) 0.5 ML injection, Inject 0.5 mLs into the muscle., Disp: 0.5 mL, Rfl: 0   ondansetron (ZOFRAN) 4 MG tablet, Take 1 tablet (4 mg total) by mouth 2 (two) times daily as needed., Disp: 60 tablet, Rfl: 3   ondansetron (ZOFRAN) 4 MG tablet, Take 1 tablet (4 mg total) by mouth 2 (two) times daily as needed., Disp: 60 tablet, Rfl: 3   ramelteon (ROZEREM) 8 MG tablet, Take 1 tablet (8 mg total) by mouth at bedtime., Disp: 30 tablet, Rfl: 11   Semaglutide-Weight Management (WEGOVY) 2.4 MG/0.75ML  SOAJ, Inject 2.4 mg into the skin once a week in the morning, Disp: 9 mL, Rfl: 1   tretinoin (RETIN-A) 0.025 % cream, Apply 1 application to face externally once a day in the evening, Disp: 20 g, Rfl: 0   VUMERITY 231 MG CPDR, Take 462 mg by mouth in the morning and at bedtime., Disp: 120 capsule, Rfl: 11   zolpidem (AMBIEN CR) 6.25 MG CR tablet, Take 1 tablet (6.25 mg total) by mouth at bedtime as needed for sleep., Disp: 30 tablet, Rfl: 5   rizatriptan (MAXALT) 5 MG tablet, TAKE 1 TABLET (5 MG TOTAL) BY MOUTH AS NEEDED FOR MIGRAINE. MAY REPEAT IN 2 HOURS IF NEEDED, Disp: 10 tablet, Rfl: 3  PAST MEDICAL HISTORY: Past Medical History:  Diagnosis Date   Asthma    Lichen planus    MS (multiple sclerosis) (HCC)    PCOS (polycystic ovarian syndrome)    Raynaud's disease     PAST SURGICAL HISTORY: Past Surgical History:  Procedure Laterality Date   colonoscopy     DILATION AND CURETTAGE OF UTERUS     x 3   ESOPHAGOGASTRODUODENOSCOPY     TONSILLECTOMY AND ADENOIDECTOMY      FAMILY HISTORY: Family History  Problem Relation Age of Onset   Diabetes Mother    Thyroid disease Mother    Hypertension Mother    Hypercholesterolemia Mother    Migraines Mother    Depression Mother    Diabetes Father    Thyroid disease Father     CAD Father    Hypercholesterolemia Father    Hypertension Father    Depression Father    Pernicious anemia Father    Breast cancer Paternal Aunt     SOCIAL HISTORY:  Social History   Socioeconomic History   Marital status: Married    Spouse name: Not on file   Number of children: 0   Years of education: college   Highest education level: Professional school degree (e.g., MD, DDS, DVM, JD)  Occupational History   Occupation: physician  Tobacco Use   Smoking status: Never   Smokeless tobacco: Never  Vaping Use   Vaping Use: Never used  Substance and Sexual Activity   Alcohol use: No   Drug use: No   Sexual activity: Not on file  Other Topics Concern   Not on file  Social History Narrative   Right handed   1-2 cups caffeine daily   Lives at home with husband   Social Determinants of Health   Financial Resource Strain: Not on file  Food Insecurity: Not on file  Transportation Needs: Not on file  Physical Activity: Not on file  Stress: Not on file  Social Connections: Not on file  Intimate Partner Violence: Not on file     PHYSICAL EXAM  Vitals:   05/28/22 0820  BP: 109/72  Pulse: 95  Weight: 138 lb (62.6 kg)  Height: 5\' 5"  (1.651 m)    Body mass index is 22.96 kg/m.   General: The patient is well-developed and well-nourished and in no acute distress  HEENT:  Head is [place]/AT.  Sclera are anicteric.    Skin: Extremities are without rash or  edema.  Neurologic Exam  Mental status: The patient is alert and oriented x 3 at the time of the examination. The patient has apparent normal recent and remote memory, with an apparently normal attention span and concentration ability.   Speech is normal.  Cranial nerves: Extraocular movements are full.  Facial strength is symmetric.  No obvious hearing deficits are noted.  Motor:  Muscle bulk is normal.   Tone is normal. Strength is  5 / 5 in all 4 extremities.   Sensory: She had normal sensation to touch  and vibration in the arms and legs.    Coordination: Cerebellar testing reveals good finger-nose-finger and heel-to-shin bilaterally.  Gait and station: Station is normal.   Gait is normal. Tandem is minimally wide.   Romberg is normal. Reflexes: Deep tendon reflexes are symmetric and normal bilaterally.        DIAGNOSTIC DATA (LABS, IMAGING, TESTING) - I reviewed patient records, labs, notes, testing and imaging myself where available.  Lab Results  Component Value Date   WBC 4.2 11/21/2021   HGB 12.8 11/21/2021   HCT 42.8 11/21/2021   MCV 79 11/21/2021   PLT 342 11/21/2021      Component Value Date/Time   NA 142 11/21/2021 0911   K 5.1 11/21/2021 0911   CL 103 11/21/2021 0911   CO2 24 11/21/2021 0911   GLUCOSE 78 11/21/2021 0911   GLUCOSE 125 (H) 10/12/2019 0005   BUN 14 11/21/2021 0911   CREATININE 0.69 11/21/2021 0911   CALCIUM 9.3 11/21/2021 0911   PROT 6.4 11/21/2021 0911   ALBUMIN 4.4 11/21/2021 0911   AST 12 11/21/2021 0911   ALT 13 11/21/2021 0911   ALKPHOS 45 11/21/2021 0911   BILITOT 0.5 11/21/2021 0911   GFRNONAA >60 10/12/2019 0005   GFRAA >60 10/12/2019 0005       ASSESSMENT AND PLAN  Multiple sclerosis ([place]) - Plan: CBC with Differential/Platelet, Comprehensive metabolic panel, Lipid panel  High risk medication use - Plan: CBC with Differential/Platelet, Comprehensive metabolic panel, Lipid panel  Vitamin D deficiency - Plan: VITAMIN D 25 Hydroxy (Vit-D Deficiency, Fractures)  Dysesthesia  Raynaud's disease without gangrene  Insomnia, unspecified type  Migraine without aura and with status migrainosus, not intractable   1.  Continue Vumerity.  Check labs (once had lymphocytes of 0.6 and once 0.7 but last was 1.0).  If 0.6 or less wil lreduc Vumerity dose.  2.   Imipramine for migraines and insomnia.   Continue prn Maxalt.   . 3.   Change CR to IR Ambien.  4.   Return to see me in 6 months or sooner for new or worsening neurologic  symptoms.   Jodi Criscuolo A. Felecia Shelling, MD, [facility] [id number], [id number] AM Certified in Neurology, Clinical Neurophysiology, Sleep Medicine and Neuroimaging  [facility] [address] [place], [place] [address] [phone]

## 2022-05-29 LAB — CBC WITH DIFFERENTIAL/PLATELET
Basophils Absolute: 0 10*3/uL (ref 0.0–0.2)
Basos: 1 %
EOS (ABSOLUTE): 0.1 10*3/uL (ref 0.0–0.4)
Eos: 2 %
Hematocrit: 40.7 % (ref 34.0–46.6)
Hemoglobin: 12.2 g/dL (ref 11.1–15.9)
Immature Grans (Abs): 0 10*3/uL (ref 0.0–0.1)
Immature Granulocytes: 1 %
Lymphocytes Absolute: 1.3 10*3/uL (ref 0.7–3.1)
Lymphs: 33 %
MCH: 23.2 pg — ABNORMAL LOW (ref 26.6–33.0)
MCHC: 30 g/dL — ABNORMAL LOW (ref 31.5–35.7)
MCV: 78 fL — ABNORMAL LOW (ref 79–97)
Monocytes Absolute: 0.6 10*3/uL (ref 0.1–0.9)
Monocytes: 15 %
Neutrophils Absolute: 1.9 10*3/uL (ref 1.4–7.0)
Neutrophils: 48 %
Platelets: 356 10*3/uL (ref 150–450)
RBC: 5.25 x10E6/uL (ref 3.77–5.28)
RDW: 13.7 % (ref 11.7–15.4)
WBC: 3.8 10*3/uL (ref 3.4–10.8)

## 2022-05-29 LAB — COMPREHENSIVE METABOLIC PANEL
ALT: 20 IU/L (ref 0–32)
AST: 14 IU/L (ref 0–40)
Albumin/Globulin Ratio: 1.8 (ref 1.2–2.2)
Albumin: 4.4 g/dL (ref 3.9–4.9)
Alkaline Phosphatase: 47 IU/L (ref 44–121)
BUN/Creatinine Ratio: 23 (ref 9–23)
BUN: 14 mg/dL (ref 6–24)
Bilirubin Total: 0.5 mg/dL (ref 0.0–1.2)
CO2: 20 mmol/L (ref 20–29)
Calcium: 9.2 mg/dL (ref 8.7–10.2)
Chloride: 103 mmol/L (ref 96–106)
Creatinine, Ser: 0.61 mg/dL (ref 0.57–1.00)
Globulin, Total: 2.5 g/dL (ref 1.5–4.5)
Glucose: 73 mg/dL (ref 70–99)
Potassium: 4.1 mmol/L (ref 3.5–5.2)
Sodium: 141 mmol/L (ref 134–144)
Total Protein: 6.9 g/dL (ref 6.0–8.5)
eGFR: 115 mL/min/{1.73_m2} (ref >59)

## 2022-05-29 LAB — LIPID PANEL
Chol/HDL Ratio: 2.3 ratio (ref 0.0–4.4)
Cholesterol, Total: 178 mg/dL (ref 100–199)
HDL: 79 mg/dL (ref >39)
LDL Chol Calc (NIH): 88 mg/dL (ref 0–99)
Triglycerides: 54 mg/dL (ref 0–149)
VLDL Cholesterol Cal: 11 mg/dL (ref 5–40)

## 2022-05-29 LAB — VITAMIN D 25 HYDROXY (VIT D DEFICIENCY, FRACTURES): Vit D, 25-Hydroxy: 45.4 ng/mL (ref 30.0–100.0)

## 2022-06-13 NOTE — Progress Notes (Unsigned)
  [name] [address] [place] [address] Phone: [phone] Subjective:   Bruce Donath, am serving as a scribe for Dr. Antoine Primas.  I'm seeing this patient by the request  of:  Patient, No Pcp Per  CC: Back and neck pain follow-up  [id number]  Martha [name] is a 42 y.o. female coming in with complaint of back and neck pain. OMT 05/02/2022. Patient states that she is the same as last visit.   Medications patient has been prescribed: None  Taking:         Reviewed prior external information including notes and imaging from previsou exam, outside providers and external EMR if available.   As well as notes that were available from care everywhere and other healthcare systems.  Past medical history, social, surgical and family history all reviewed in electronic medical record.  No pertanent information unless stated regarding to the chief complaint.   Past Medical History:  Diagnosis Date   Asthma    Lichen planus    MS (multiple sclerosis) (HCC)    PCOS (polycystic ovarian syndrome)    Raynaud's disease     No Known Allergies   Review of Systems:  No headache, visual changes, nausea, vomiting, diarrhea, constipation, dizziness, abdominal pain, skin rash, fevers, chills, night sweats, weight loss, swollen lymph nodes, body aches, joint swelling, chest pain, shortness of breath, mood changes. POSITIVE muscle aches  Objective  Blood pressure 112/82, pulse 83, height 5\' 5"  (1.651 m), weight 140 lb (63.5 kg).   General: No apparent distress alert and oriented x3 mood and affect normal, dressed appropriately.  HEENT: Pupils equal, extraocular movements intact  Respiratory: Patient's speak in full sentences and does not appear short of breath  Cardiovascular: No lower extremity edema, non tender, no erythema  Low back does have some loss lordosis.  Patient does have tightness still noted in the thoracolumbar junction noted.   Seems to be more right greater than left.  Tightness in the parascapular region also noted still.  Osteopathic findings  C2 flexed rotated and side bent right C6 flexed rotated and side bent left T3 extended rotated and side bent right inhaled rib L1 flexed rotated and side bent right Sacrum right on right       Assessment and Plan:  No problem-specific Assessment & Plan notes found for this encounter.    Nonallopathic problems  Decision today to treat with OMT was based on Physical Exam  After verbal consent patient was treated with HVLA, ME, FPR techniques in cervical, rib, thoracic, lumbar, and sacral  areas  Patient tolerated the procedure well with improvement in symptoms  Patient given exercises, stretches and lifestyle modifications  See medications in patient instructions if given  Patient will follow up in 8 weeks     The above documentation has been reviewed and is accurate and complete Judi Saa, DO         Note: This dictation was prepared with Dragon dictation along with smaller phrase technology. Any transcriptional errors that result from this process are unintentional.

## 2022-06-14 ENCOUNTER — Encounter: Payer: Self-pay | Admitting: Family Medicine

## 2022-06-14 ENCOUNTER — Ambulatory Visit (INDEPENDENT_AMBULATORY_CARE_PROVIDER_SITE_OTHER): Payer: 59 | Admitting: Family Medicine

## 2022-06-14 VITALS — BP 112/82 | HR 83 | Ht 65.0 in | Wt 140.0 lb

## 2022-06-14 DIAGNOSIS — M9901 Segmental and somatic dysfunction of cervical region: Secondary | ICD-10-CM | POA: Diagnosis not present

## 2022-06-14 DIAGNOSIS — M9903 Segmental and somatic dysfunction of lumbar region: Secondary | ICD-10-CM

## 2022-06-14 DIAGNOSIS — G8929 Other chronic pain: Secondary | ICD-10-CM | POA: Diagnosis not present

## 2022-06-14 DIAGNOSIS — M546 Pain in thoracic spine: Secondary | ICD-10-CM | POA: Diagnosis not present

## 2022-06-14 DIAGNOSIS — M9908 Segmental and somatic dysfunction of rib cage: Secondary | ICD-10-CM

## 2022-06-14 DIAGNOSIS — M9902 Segmental and somatic dysfunction of thoracic region: Secondary | ICD-10-CM

## 2022-06-14 DIAGNOSIS — M9904 Segmental and somatic dysfunction of sacral region: Secondary | ICD-10-CM

## 2022-06-14 NOTE — Assessment & Plan Note (Signed)
Patient does have some loss lordosis noted.  Some tenderness to palpation in the paraspinal musculature.  Patient has been doing relatively well.  Did have some increase in tightness in the lower back that we have seen previously.  Patient declined any other type of medication.  Will be traveling.  Follow-up again in 2 months patient knows if any weakness occurs to see if sooner

## 2022-06-25 ENCOUNTER — Other Ambulatory Visit: Payer: Self-pay

## 2022-06-25 ENCOUNTER — Other Ambulatory Visit (HOSPITAL_COMMUNITY): Payer: Self-pay

## 2022-07-18 ENCOUNTER — Other Ambulatory Visit: Payer: Self-pay

## 2022-07-18 ENCOUNTER — Other Ambulatory Visit: Payer: Self-pay | Admitting: *Deleted

## 2022-07-18 ENCOUNTER — Telehealth: Payer: Self-pay | Admitting: *Deleted

## 2022-07-18 ENCOUNTER — Other Ambulatory Visit (HOSPITAL_COMMUNITY): Payer: Self-pay

## 2022-07-18 DIAGNOSIS — G35 Multiple sclerosis: Secondary | ICD-10-CM

## 2022-07-18 MED ORDER — VUMERITY 231 MG PO CPDR
462.0000 mg | DELAYED_RELEASE_CAPSULE | Freq: Two times a day (BID) | ORAL | 3 refills | Status: DC
  Filled 2022-07-18: qty 360, fill #0

## 2022-07-18 NOTE — Telephone Encounter (Signed)
Faxed completed/signed PA Vumerity to [facility] at [phone]. Received fax confirmation, waiting on determination.

## 2022-07-18 NOTE — Telephone Encounter (Signed)
Received fax from [employer] that PA approved 07/18/22-07/17/23. PA ref# [id number].

## 2022-07-19 ENCOUNTER — Other Ambulatory Visit: Payer: Self-pay | Admitting: *Deleted

## 2022-07-19 DIAGNOSIS — G35 Multiple sclerosis: Secondary | ICD-10-CM

## 2022-07-19 MED ORDER — VUMERITY 231 MG PO CPDR: 462.0000 mg | DELAYED_RELEASE_CAPSULE | Freq: Two times a day (BID) | ORAL | 3 refills | Status: DC

## 2022-07-25 NOTE — Progress Notes (Unsigned)
  [name] [address] [place] [address] Phone: [phone] Subjective:   INadine Counts, am serving as a scribe for Dr. Antoine Primas.  I'm seeing this patient by the request  of:  Patient, No Pcp Per  CC: Back and neck pain follow-up  BJY:[id number]  Shaunta Tooley is a 42 y.o. female coming in with complaint of back and neck pain. OMT 06/14/2022. Patient states same per usual. No new concerns.  Medications patient has been prescribed: None       Reviewed prior external information including notes and imaging from previsou exam, outside providers and external EMR if available.   As well as notes that were available from care everywhere and other healthcare systems.  Past medical history, social, surgical and family history all reviewed in electronic medical record.  No pertanent information unless stated regarding to the chief complaint.   Past Medical History:  Diagnosis Date   Asthma    Lichen planus    MS (multiple sclerosis) (HCC)    PCOS (polycystic ovarian syndrome)    Raynaud's disease     No Known Allergies   Review of Systems:  No headache, visual changes, nausea, vomiting, diarrhea, constipation, dizziness, abdominal pain, skin rash, fevers, chills, night sweats, weight loss, swollen lymph nodes, body aches, joint swelling, chest pain, shortness of breath, mood changes. POSITIVE muscle aches  Objective  Blood pressure 120/66, pulse 66, height 5\' 5"  (1.651 m), weight 137 lb (62.1 kg).   General: No apparent distress alert and oriented x3 mood and affect normal, dressed appropriately.  HEENT: Pupils equal, extraocular movements intact  Respiratory: Patient's speak in full sentences and does not appear short of breath  Cardiovascular: No lower extremity edema, non tender, no erythema  Tightness noted on the right side of the parascapular area.  Patient does have some limited sidebending to the right side of the neck.   Low back very mild discomfort around the right sacroiliac joint  Osteopathic findings  C2 flexed rotated and side bent right T4 extended rotated and side bent right inhaled rib L1 flexed rotated and side bent right Sacrum right on right       Assessment and Plan:  Thoracic back pain Thoracic back pain.  Past medical history significant for MS but no flares in quite some time.  Discussed with patient to continue to work on Psychologist, forensic.  Which activities to do and which ones to avoid.  No other significant changes.  Follow-up again in 6 to 8 weeks.    Nonallopathic problems  Decision today to treat with OMT was based on Physical Exam  After verbal consent patient was treated with HVLA, ME, FPR techniques in cervical, rib, thoracic, lumbar, and sacral  areas  Patient tolerated the procedure well with improvement in symptoms  Patient given exercises, stretches and lifestyle modifications  See medications in patient instructions if given  Patient will follow up in 4-8 weeks     The above documentation has been reviewed and is accurate and complete Judi Saa, DO         Note: This dictation was prepared with Dragon dictation along with smaller phrase technology. Any transcriptional errors that result from this process are unintentional.

## 2022-07-26 ENCOUNTER — Other Ambulatory Visit (HOSPITAL_COMMUNITY): Payer: Self-pay

## 2022-07-26 ENCOUNTER — Ambulatory Visit (INDEPENDENT_AMBULATORY_CARE_PROVIDER_SITE_OTHER): Payer: 59 | Admitting: Family Medicine

## 2022-07-26 ENCOUNTER — Encounter: Payer: Self-pay | Admitting: Family Medicine

## 2022-07-26 VITALS — BP 120/66 | HR 66 | Ht 65.0 in | Wt 137.0 lb

## 2022-07-26 DIAGNOSIS — G8929 Other chronic pain: Secondary | ICD-10-CM | POA: Diagnosis not present

## 2022-07-26 DIAGNOSIS — M546 Pain in thoracic spine: Secondary | ICD-10-CM | POA: Diagnosis not present

## 2022-07-26 DIAGNOSIS — M9903 Segmental and somatic dysfunction of lumbar region: Secondary | ICD-10-CM

## 2022-07-26 DIAGNOSIS — M9904 Segmental and somatic dysfunction of sacral region: Secondary | ICD-10-CM | POA: Diagnosis not present

## 2022-07-26 DIAGNOSIS — M9901 Segmental and somatic dysfunction of cervical region: Secondary | ICD-10-CM | POA: Diagnosis not present

## 2022-07-26 DIAGNOSIS — M9902 Segmental and somatic dysfunction of thoracic region: Secondary | ICD-10-CM

## 2022-07-26 DIAGNOSIS — M9908 Segmental and somatic dysfunction of rib cage: Secondary | ICD-10-CM | POA: Diagnosis not present

## 2022-07-26 NOTE — Assessment & Plan Note (Signed)
Thoracic back pain.  Past medical history significant for MS but no flares in quite some time.  Discussed with patient to continue to work on Psychologist, forensic.  Which activities to do and which ones to avoid.  No other significant changes.  Follow-up again in 6 to 8 weeks.

## 2022-07-29 ENCOUNTER — Other Ambulatory Visit (HOSPITAL_COMMUNITY): Payer: Self-pay

## 2022-07-29 ENCOUNTER — Telehealth: Payer: Self-pay

## 2022-07-29 NOTE — Telephone Encounter (Signed)
Received a PA request via CMM-however per CMM-

## 2022-08-01 ENCOUNTER — Telehealth: Payer: Self-pay

## 2022-08-01 NOTE — Telephone Encounter (Signed)
[employer] called to ask for verbal to change to [facility] to 28 days instead of 14 days. Per Dr Drue Second ok to give verbal. Spoke to Caryn Bee [phone].

## 2022-08-02 ENCOUNTER — Ambulatory Visit (INDEPENDENT_AMBULATORY_CARE_PROVIDER_SITE_OTHER): Payer: 59 | Admitting: Family Medicine

## 2022-08-02 ENCOUNTER — Encounter: Payer: Self-pay | Admitting: Family Medicine

## 2022-08-02 ENCOUNTER — Other Ambulatory Visit: Payer: Self-pay

## 2022-08-02 VITALS — BP 112/82 | HR 86 | Ht 65.0 in | Wt 135.0 lb

## 2022-08-02 DIAGNOSIS — M25562 Pain in left knee: Secondary | ICD-10-CM | POA: Diagnosis not present

## 2022-08-02 NOTE — Progress Notes (Signed)
[name] [address] [place] [address] Phone: [phone] Subjective:   Bruce Donath, am serving as a scribe for Dr. Antoine Primas.  I'm seeing this patient by the request  of:  Patient, No Pcp Per  CC: Knee injury  BJY:[id number]  Martha [name] is a 42 y.o. female coming in with complaint of L knee pain. Patient states that she was trying to invert her L foot and had pain over lateral side of L knee. Has been doing some squats and developed pain on Sunday but thought she slept funny as she wasn't active that day. On Tuesday her pain increased from dull to sharp.    Past medical history significant for a left-sided patellar tendinitis as well as degenerative arthritis of the right knee.  X-rays of the right knee in 2022 did show some very mild narrowing of the patellofemoral joint  Since we have seen patient recently had an MRI of the brain shows no significant progression of patient's demyelinating disease  Past Medical History:  Diagnosis Date   Asthma    Lichen planus    MS (multiple sclerosis) (HCC)    PCOS (polycystic ovarian syndrome)    Raynaud's disease    Past Surgical History:  Procedure Laterality Date   colonoscopy     DILATION AND CURETTAGE OF UTERUS     x 3   ESOPHAGOGASTRODUODENOSCOPY     TONSILLECTOMY AND ADENOIDECTOMY     Social History   Socioeconomic History   Marital status: Married    Spouse name: Not on file   Number of children: 0   Years of education: college   Highest education level: Professional school degree (e.g., MD, DDS, DVM, JD)  Occupational History   Occupation: physician  Tobacco Use   Smoking status: Never   Smokeless tobacco: Never  Vaping Use   Vaping Use: Never used  Substance and Sexual Activity   Alcohol use: No   Drug use: No   Sexual activity: Not on file  Other Topics Concern   Not on file  Social History Narrative   Right handed   1-2 cups caffeine daily   Lives at  home with husband   Social Determinants of Health   Financial Resource Strain: Not on file  Food Insecurity: Not on file  Transportation Needs: Not on file  Physical Activity: Not on file  Stress: Not on file  Social Connections: Not on file   No Known Allergies Family History  Problem Relation Age of Onset   Diabetes Mother    Thyroid disease Mother    Hypertension Mother    Hypercholesterolemia Mother    Migraines Mother    Depression Mother    Diabetes Father    Thyroid disease Father    CAD Father    Hypercholesterolemia Father    Hypertension Father    Depression Father    Pernicious anemia Father    Breast cancer Paternal Aunt      Current Outpatient Medications (Cardiovascular):    amLODipine (NORVASC) 2.5 MG tablet, Take 1 tablet (2.5 mg total) by mouth daily. (Patient taking differently: Take 2.5 mg by mouth daily. Only takes during winter months)   Current Outpatient Medications (Analgesics):    rizatriptan (MAXALT) 5 MG tablet, TAKE 1 TABLET (5 MG TOTAL) BY MOUTH AS NEEDED FOR MIGRAINE. MAY REPEAT IN 2 HOURS IF NEEDED   Current Outpatient Medications (Other):    clobetasol cream (TEMOVATE) 0.05 %, Apply 1 Application topically  2 (two) times daily for 14 days.   imipramine (TOFRANIL) 25 MG tablet, Take 1-2 tablets (25-50 mg total) by mouth at bedtime.   ondansetron (ZOFRAN) 4 MG tablet, Take 1 tablet (4 mg total) by mouth 2 (two) times daily as needed.   ondansetron (ZOFRAN) 4 MG tablet, Take 1 tablet (4 mg total) by mouth 2 (two) times daily as needed.   ramelteon (ROZEREM) 8 MG tablet, Take 1 tablet (8 mg total) by mouth at bedtime.   Semaglutide-Weight Management (WEGOVY) 2.4 MG/0.75ML SOAJ, Inject 2.4 mg into the skin once a week in the morning   tiZANidine (ZANAFLEX) 4 MG tablet, Take 1 tablet (4 mg total) by mouth Nightly.   tretinoin (RETIN-A) 0.025 % cream, Apply 1 application to face externally once a day in the evening   VUMERITY 231 MG CPDR, Take  462 mg by mouth in the morning and at bedtime.   zolpidem (AMBIEN) 10 MG tablet, Take 1/2-1 tablet (5-10 mg total) by mouth at bedtime.   Reviewed prior external information including notes and imaging from  primary care provider As well as notes that were available from care everywhere and other healthcare systems.  Past medical history, social, surgical and family history all reviewed in electronic medical record.  No pertanent information unless stated regarding to the chief complaint.   Review of Systems:  No headache, visual changes, nausea, vomiting, diarrhea, constipation, dizziness, abdominal pain, skin rash, fevers, chills, night sweats, weight loss, swollen lymph nodes, body aches, joint swelling, chest pain, shortness of breath, mood changes. POSITIVE muscle aches  Objective  Blood pressure 112/82, pulse 86, height 5\' 5"  (1.651 m), weight 135 lb (61.2 kg), SpO2 97 %.   General: No apparent distress alert and oriented x3 mood and affect normal, dressed appropriately.  HEENT: Pupils equal, extraocular movements intact  Respiratory: Patient's speak in full sentences and does not appear short of breath  Cardiovascular: No lower extremity edema, non tender, no erythema  Knee exam shows patient does have some lateral tracking of the patella noted.  Mild positive patellar grind test.  Minimal discomfort noted on palpation.  Good range of motion noted.  Limited muscular skeletal ultrasound was performed and interpreted by Antoine Primas, M  Patient does have narrowing noted of the patellofemoral space.  Patient does have what appears to be a superior lateral plica noted.  Meniscus has some mild degenerative changes but no true acute tearing. Impression: Consistent with potential lateral plica and patellofemoral syndrome    Impression and Recommendations:     The above documentation has been reviewed and is accurate and complete Judi Saa, DO

## 2022-08-02 NOTE — Assessment & Plan Note (Signed)
Patient is having more of a patellofemoral difficulty.  Discussed with patient about icing regimen and home exercises.  Discussed with patient about treatment provide brace to stop the lateral tracking.  Discussed the importance of the VMO strengthening.  Follow-up with me again in 6 to 8 weeks of the wrist.

## 2022-08-02 NOTE — Patient Instructions (Signed)
Trupull lite PF exercises See me as schedule

## 2022-08-29 NOTE — Progress Notes (Signed)
[name] [address] [place] [address] Phone: [phone] Subjective:   INadine Counts, am serving as a scribe for Dr. Antoine Primas.  I'm seeing this patient by the request  of:  Patient, No Pcp Per  CC: Back and neck pain follow-up  ION:[id number]  Serria Secoy is a 42 y.o. female coming in with complaint of back and neck pain. OMT 07/26/2022.  Last visit on 08/02/2022 was for L knee pain. Patient states knee still hurts. Knee is popping and catching more than usual. Catching is associated with pain. Brace doesn't seem to help  Medications patient has been prescribed: None  Taking:         Reviewed prior external information including notes and imaging from previsou exam, outside providers and external EMR if available.   As well as notes that were available from care everywhere and other healthcare systems.  Past medical history, social, surgical and family history all reviewed in electronic medical record.  No pertanent information unless stated regarding to the chief complaint.   Past Medical History:  Diagnosis Date   Asthma    Lichen planus    MS (multiple sclerosis) (HCC)    PCOS (polycystic ovarian syndrome)    Raynaud's disease     No Known Allergies   Review of Systems:  No headache, visual changes, nausea, vomiting, diarrhea, constipation, dizziness, abdominal pain, skin rash, fevers, chills, night sweats, weight loss, swollen lymph nodes, body aches, joint swelling, chest pain, shortness of breath, mood changes. POSITIVE muscle aches  Objective  Blood pressure 112/78, pulse 60, height 5\' 5"  (1.651 m), weight 145 lb (65.8 kg).   General: No apparent distress alert and oriented x3 mood and affect normal, dressed appropriately.  HEENT: Pupils equal, extraocular movements intact  Respiratory: Patient's speak in full sentences and does not appear short of breath  Cardiovascular: No lower extremity edema, non tender,  no erythema  Left knee still has a clicking noted.  Still has what appears to be a plica also noted of the anterior lateral aspect of the patellofemoral area.  Patient does have some tightness noted.  On exam seems to be having any instability.  Back exam does have significant tightness mostly of the thoracic spine, seems to be right greater than left does have tightness on the right  Osteopathic findings  C3 flexed rotated and side bent right C7 flexed rotated and side bent left T3 extended rotated and side bent right inhaled rib T7 extended rotated and side bent left L1 flexed rotated and side bent right Sacrum right on right       Assessment and Plan:  Patellofemoral arthralgia of left knee Discussed with patient again.  Will continue to monitor.  Discussed icing regimen and home exercises.  Continue the brace.  Worsening pain consider injection.  Thoracic back pain Discussed with patient again at great length, discussed with patient about home exercises.  Patient has done a phenomenal job with strengthening at the moment is done well with her weight as well.  Follow-up with me again in 6 to 8 weeks otherwise.    Nonallopathic problems  Decision today to treat with OMT was based on Physical Exam  After verbal consent patient was treated with HVLA, ME, FPR techniques in cervical, rib, thoracic, lumbar, and sacral  areas  Patient tolerated the procedure well with improvement in symptoms  Patient given exercises, stretches and lifestyle modifications  See medications in patient instructions if given  Patient will follow up in 4-8 weeks     The above documentation has been reviewed and is accurate and complete Judi Saa, DO         Note: This dictation was prepared with Dragon dictation along with smaller phrase technology. Any transcriptional errors that result from this process are unintentional.

## 2022-08-30 ENCOUNTER — Ambulatory Visit (INDEPENDENT_AMBULATORY_CARE_PROVIDER_SITE_OTHER): Payer: 59

## 2022-08-30 ENCOUNTER — Ambulatory Visit (INDEPENDENT_AMBULATORY_CARE_PROVIDER_SITE_OTHER): Payer: 59 | Admitting: Family Medicine

## 2022-08-30 ENCOUNTER — Encounter: Payer: Self-pay | Admitting: Family Medicine

## 2022-08-30 VITALS — BP 112/78 | HR 60 | Ht 65.0 in | Wt 145.0 lb

## 2022-08-30 DIAGNOSIS — M25562 Pain in left knee: Secondary | ICD-10-CM

## 2022-08-30 DIAGNOSIS — M9903 Segmental and somatic dysfunction of lumbar region: Secondary | ICD-10-CM | POA: Diagnosis not present

## 2022-08-30 DIAGNOSIS — M9908 Segmental and somatic dysfunction of rib cage: Secondary | ICD-10-CM

## 2022-08-30 DIAGNOSIS — G8929 Other chronic pain: Secondary | ICD-10-CM | POA: Diagnosis not present

## 2022-08-30 DIAGNOSIS — M546 Pain in thoracic spine: Secondary | ICD-10-CM | POA: Diagnosis not present

## 2022-08-30 DIAGNOSIS — M9902 Segmental and somatic dysfunction of thoracic region: Secondary | ICD-10-CM

## 2022-08-30 DIAGNOSIS — M9901 Segmental and somatic dysfunction of cervical region: Secondary | ICD-10-CM

## 2022-08-30 DIAGNOSIS — M9904 Segmental and somatic dysfunction of sacral region: Secondary | ICD-10-CM | POA: Diagnosis not present

## 2022-08-30 NOTE — Patient Instructions (Addendum)
Xrays today Good to see you! See you again in 6-8 weeks

## 2022-08-30 NOTE — Assessment & Plan Note (Signed)
Discussed with patient again at great length, discussed with patient about home exercises.  Patient has done a phenomenal job with strengthening at the moment is done well with her weight as well.  Follow-up with me again in 6 to 8 weeks otherwise.

## 2022-08-30 NOTE — Assessment & Plan Note (Signed)
Discussed with patient again.  Will continue to monitor.  Discussed icing regimen and home exercises.  Continue the brace.  Worsening pain consider injection.

## 2022-09-14 ENCOUNTER — Telehealth: Payer: 59 | Admitting: Family Medicine

## 2022-09-14 ENCOUNTER — Ambulatory Visit (HOSPITAL_COMMUNITY): Payer: 59

## 2022-09-14 DIAGNOSIS — J0100 Acute maxillary sinusitis, unspecified: Secondary | ICD-10-CM | POA: Diagnosis not present

## 2022-09-14 MED ORDER — AMOXICILLIN-POT CLAVULANATE 875-125 MG PO TABS: 1.0000 | ORAL_TABLET | Freq: Two times a day (BID) | ORAL | 0 refills | Status: DC

## 2022-09-14 NOTE — Patient Instructions (Signed)

## 2022-09-14 NOTE — Progress Notes (Signed)
Virtual Visit Consent   Martha [name], you are scheduled for a virtual visit with a [facility] provider today. Just as with appointments in the office, your consent must be obtained to participate. Your consent will be active for this visit and any virtual visit you may have with one of our providers in the next 365 days. If you have a MyChart account, a copy of this consent can be sent to you electronically.  As this is a virtual visit, video technology does not allow for your provider to perform a traditional examination. This may limit your provider's ability to fully assess your condition. If your provider identifies any concerns that need to be evaluated in person or the need to arrange testing (such as labs, EKG, etc.), we will make arrangements to do so. Although advances in technology are sophisticated, we cannot ensure that it will always work on either your end or our end. If the connection with a video visit is poor, the visit may have to be switched to a telephone visit. With either a video or telephone visit, we are not always able to ensure that we have a secure connection.  By engaging in this virtual visit, you consent to the provision of healthcare and authorize for your insurance to be billed (if applicable) for the services provided during this visit. Depending on your insurance coverage, you may receive a charge related to this service.  I need to obtain your verbal consent now. Are you willing to proceed with your visit today? Martha [name] has provided verbal consent on 09/14/2022 for a virtual visit (video or telephone). Georgana Curio, FNP  Date: 09/14/2022 4:54 PM  Virtual Visit via Video Note   I, Georgana Curio, connected with  Martha [name]  ([id number], 25-Apr-1980) on 09/14/22 at  4:30 PM EDT by a video-enabled telemedicine application and verified that I am speaking with the correct person using two identifiers.  Location: Patient: Virtual Visit Location Patient:  Home Provider: Virtual Visit Location Provider: Home Office   I discussed the limitations of evaluation and management by telemedicine and the availability of in person appointments. The patient expressed understanding and agreed to proceed.    History of Present Illness: Martha [name] is a 42 y.o. who identifies as a female who was assigned female at birth, and is being seen today for sinus pain and pressure persistent over the past few weeks with pressure in gumline. No fever. Mild cough. 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 Marland Kitchen  HPI: HPI  Problems:  Patient Active Problem List   Diagnosis Date Noted   Patellofemoral arthralgia of left knee 08/02/2022   Toe pain 08/02/2021   Finger pain, right 05/25/2021   Insomnia 05/15/2021   Raynaud's disease without gangrene 05/15/2021   High risk medication use 05/11/2020   Dysesthesia 05/11/2020   Hypocalcemia 10/21/2019   Microcytic anemia 10/21/2019   Lichen planus 08/24/2019   Multiple sclerosis (HCC) 08/24/2019   Degenerative arthritis of right knee 12/02/2018   Patellar tendinitis of left knee 08/06/2018   Medial meniscus tear 04/16/2018   Migraine without aura and with status migrainosus, not intractable 01/10/2017   GERD (gastroesophageal reflux disease) 02/25/2015   Thoracic back pain 10/21/2014   Nonallopathic lesion of thoracic region 10/21/2014   Nonallopathic lesion of lumbosacral region 10/21/2014   Nonallopathic lesion-rib cage 10/21/2014    Allergies: No Known Allergies Medications:  Current Outpatient Medications:    amoxicillin-clavulanate (AUGMENTIN) 875-125  MG tablet, Take 1 tablet by mouth 2 (two) times daily., Disp: 20 tablet, Rfl: 0   amLODipine (NORVASC) 2.5 MG tablet, Take 1 tablet (2.5 mg total) by mouth daily. (Patient taking differently: Take 2.5 mg by mouth daily. Only takes during winter months), Disp: 90 tablet, Rfl: 0   clobetasol cream (TEMOVATE) 0.05 %,  Apply 1 Application topically 2 (two) times daily for 14 days., Disp: 30 g, Rfl: 0   imipramine (TOFRANIL) 25 MG tablet, Take 1-2 tablets (25-50 mg total) by mouth at bedtime., Disp: 60 tablet, Rfl: 11   ondansetron (ZOFRAN) 4 MG tablet, Take 1 tablet (4 mg total) by mouth 2 (two) times daily as needed., Disp: 60 tablet, Rfl: 3   ondansetron (ZOFRAN) 4 MG tablet, Take 1 tablet (4 mg total) by mouth 2 (two) times daily as needed., Disp: 60 tablet, Rfl: 3   ramelteon (ROZEREM) 8 MG tablet, Take 1 tablet (8 mg total) by mouth at bedtime., Disp: 30 tablet, Rfl: 11   rizatriptan (MAXALT) 5 MG tablet, TAKE 1 TABLET (5 MG TOTAL) BY MOUTH AS NEEDED FOR MIGRAINE. MAY REPEAT IN 2 HOURS IF NEEDED, Disp: 10 tablet, Rfl: 3   Semaglutide-Weight Management (WEGOVY) 2.4 MG/0.75ML SOAJ, Inject 2.4 mg into the skin once a week in the morning, Disp: 9 mL, Rfl: 1   tretinoin (RETIN-A) 0.025 % cream, Apply 1 application to face externally once a day in the evening, Disp: 20 g, Rfl: 0   VUMERITY 231 MG CPDR, Take 462 mg by mouth in the morning and at bedtime., Disp: 360 capsule, Rfl: 3   zolpidem (AMBIEN) 10 MG tablet, Take 1/2-1 tablet (5-10 mg total) by mouth at bedtime., Disp: 30 tablet, Rfl: 5  Observations/Objective: Patient is well-developed, well-nourished in no acute distress.  Resting comfortably  at home.  Head is normocephalic, atraumatic.  No labored breathing.  Speech is clear and coherent with logical content.  Patient is alert and oriented at baseline.    Assessment and Plan: 1. Acute non-recurrent maxillary sinusitis  Increase fluids, med use and side effects per pharmacy, UC if sx worsen.   Follow Up Instructions: I discussed the assessment and treatment plan with the patient. The patient was provided an opportunity to ask questions and all were answered. The patient agreed with the plan and demonstrated an understanding of the instructions.  A copy of instructions were sent to the patient via  MyChart unless otherwise noted below.     The patient was advised to call back or seek an in-person evaluation if the symptoms worsen or if the condition fails to improve as anticipated.  Time:  I spent 10 minutes with the patient via telehealth technology discussing the above problems/concerns.    Georgana Curio, FNP

## 2022-10-04 NOTE — Progress Notes (Signed)
  [name] [address] [place] [address] Phone: [phone] Subjective:   Martha [name], am serving as a scribe for Dr. Antoine Primas.  I'm seeing this patient by the request  of:  Patient, No Pcp Per  CC: back and neck pain follow up   [id number]  [name] is a 42 y.o. female coming in with complaint of back and neck pain. OMT on 08/30/2022. Patient states that her back has been doing well. Knee and back held up with walking 10 miles daily on her trip.          Reviewed prior external information including notes and imaging from previsou exam, outside providers and external EMR if available.   As well as notes that were available from care everywhere and other healthcare systems.  Past medical history, social, surgical and family history all reviewed in electronic medical record.  No pertanent information unless stated regarding to the chief complaint.   Past Medical History:  Diagnosis Date   Asthma    Lichen planus    MS (multiple sclerosis) (HCC)    PCOS (polycystic ovarian syndrome)    Raynaud's disease     No Known Allergies   Review of Systems:  No headache, visual changes, nausea, vomiting, diarrhea, constipation, dizziness, abdominal pain, skin rash, fevers, chills, night sweats, weight loss, swollen lymph nodes, body aches, joint swelling, chest pain, shortness of breath, mood changes. POSITIVE muscle aches  Objective  Blood pressure 120/82, height 5\' 5"  (1.651 m), weight 140 lb (63.5 kg).   General: No apparent distress alert and oriented x3 mood and affect normal, dressed appropriately.  HEENT: Pupils equal, extraocular movements intact  Respiratory: Patient's speak in full sentences and does not appear short of breath  Cardiovascular: No lower extremity edema, non tender, no erythema  .  Tender to  Osteopathic findings  C2 flexed rotated and side bent right C7 flexed rotated and side bent left T3  extended rotated and side bent right inhaled rib T8 extended rotated and side bent left L2 flexed rotated and side bent right Sacrum right on right    Assessment and Plan:  Thoracic back pain Thoracic back pain  Discussed HEP, no med changes  RTC in 6 weeks.      Nonallopathic problems  Decision today to treat with OMT was based on Physical Exam  After verbal consent patient was treated with HVLA, ME, FPR techniques in cervical, rib, thoracic, lumbar, and sacral  areas  Patient tolerated the procedure well with improvement in symptoms  Patient given exercises, stretches and lifestyle modifications  See medications in patient instructions if given  Patient will follow up in 4-8 weeks     The above documentation has been reviewed and is accurate and complete Judi Saa, DO         Note: This dictation was prepared with Dragon dictation along with smaller phrase technology. Any transcriptional errors that result from this process are unintentional.

## 2022-10-10 ENCOUNTER — Encounter: Payer: Self-pay | Admitting: Family Medicine

## 2022-10-10 ENCOUNTER — Ambulatory Visit (INDEPENDENT_AMBULATORY_CARE_PROVIDER_SITE_OTHER): Payer: 59 | Admitting: Family Medicine

## 2022-10-10 ENCOUNTER — Other Ambulatory Visit: Payer: Self-pay

## 2022-10-10 VITALS — BP 120/82 | Ht 65.0 in | Wt 140.0 lb

## 2022-10-10 DIAGNOSIS — M9908 Segmental and somatic dysfunction of rib cage: Secondary | ICD-10-CM | POA: Diagnosis not present

## 2022-10-10 DIAGNOSIS — M9903 Segmental and somatic dysfunction of lumbar region: Secondary | ICD-10-CM | POA: Diagnosis not present

## 2022-10-10 DIAGNOSIS — M9902 Segmental and somatic dysfunction of thoracic region: Secondary | ICD-10-CM

## 2022-10-10 DIAGNOSIS — M546 Pain in thoracic spine: Secondary | ICD-10-CM | POA: Diagnosis not present

## 2022-10-10 DIAGNOSIS — M9904 Segmental and somatic dysfunction of sacral region: Secondary | ICD-10-CM | POA: Diagnosis not present

## 2022-10-10 DIAGNOSIS — M9901 Segmental and somatic dysfunction of cervical region: Secondary | ICD-10-CM | POA: Diagnosis not present

## 2022-10-10 DIAGNOSIS — G8929 Other chronic pain: Secondary | ICD-10-CM | POA: Diagnosis not present

## 2022-10-10 NOTE — Assessment & Plan Note (Signed)
Thoracic back pain  Discussed HEP, no med changes  RTC in 6 weeks.

## 2022-10-22 ENCOUNTER — Ambulatory Visit (INDEPENDENT_AMBULATORY_CARE_PROVIDER_SITE_OTHER): Payer: 59 | Admitting: "Endocrinology

## 2022-10-22 ENCOUNTER — Encounter: Payer: Self-pay | Admitting: "Endocrinology

## 2022-10-22 VITALS — BP 112/78 | HR 76 | Ht 65.0 in | Wt 143.8 lb

## 2022-10-22 DIAGNOSIS — E785 Hyperlipidemia, unspecified: Secondary | ICD-10-CM | POA: Diagnosis not present

## 2022-10-22 DIAGNOSIS — Z7689 Persons encountering health services in other specified circumstances: Secondary | ICD-10-CM

## 2022-10-22 NOTE — Progress Notes (Signed)
Endocrinology Consult Note                                            10/22/2022, 2:44 PM   Subjective:    Patient ID: Martha [name], female    DOB: 08-15-1980, PCP Patient, No Pcp Per   Past Medical History:  Diagnosis Date   Asthma    Lichen planus    MS (multiple sclerosis) (HCC)    PCOS (polycystic ovarian syndrome)    Raynaud's disease    Past Surgical History:  Procedure Laterality Date   colonoscopy     DILATION AND CURETTAGE OF UTERUS     x 3   ESOPHAGOGASTRODUODENOSCOPY     TONSILLECTOMY AND ADENOIDECTOMY     Social History   Socioeconomic History   Marital status: Married    Spouse name: Not on file   Number of children: 0   Years of education: college   Highest education level: Professional school degree (e.g., MD, DDS, DVM, JD)  Occupational History   Occupation: physician  Tobacco Use   Smoking status: Never   Smokeless tobacco: Never  Vaping Use   Vaping status: Never Used  Substance and Sexual Activity   Alcohol use: No   Drug use: No   Sexual activity: Not on file  Other Topics Concern   Not on file  Social History Narrative   Right handed   1-2 cups caffeine daily   Lives at home with husband   Social Determinants of Health   Financial Resource Strain: Not on file  Food Insecurity: Not on file  Transportation Needs: Not on file  Physical Activity: Not on file  Stress: Not on file  Social Connections: Not on file   Family History  Problem Relation Age of Onset   Diabetes Mother    Thyroid disease Mother    Hypertension Mother    Hypercholesterolemia Mother    Migraines Mother    Depression Mother    Diabetes Father    Thyroid disease Father    CAD Father    Hypercholesterolemia Father    Hypertension Father    Depression Father    Pernicious anemia Father    Breast cancer Paternal Aunt    Outpatient Encounter Medications as of 10/22/2022  Medication Sig   amLODipine (NORVASC) 2.5 MG tablet Take 1 tablet (2.5 mg  total) by mouth daily. (Patient taking differently: Take 2.5 mg by mouth daily. Only takes during winter months)   imipramine (TOFRANIL) 25 MG tablet Take 1-2 tablets (25-50 mg total) by mouth at bedtime.   ondansetron (ZOFRAN) 4 MG tablet Take 1 tablet (4 mg total) by mouth 2 (two) times daily as needed.   ondansetron (ZOFRAN) 4 MG tablet Take 1 tablet (4 mg total) by mouth 2 (two) times daily as needed.   tretinoin (RETIN-A) 0.025 % cream Apply 1 application to face externally once a day in the evening   VUMERITY 231 MG CPDR Take 462 mg by mouth in the morning and at bedtime.   zolpidem (AMBIEN) 10 MG tablet Take 1/2-1 tablet (5-10 mg total) by mouth at bedtime.   [DISCONTINUED] amoxicillin-clavulanate (AUGMENTIN) 875-125 MG tablet Take 1 tablet by mouth 2 (two) times daily.   [DISCONTINUED] clobetasol cream (TEMOVATE) 0.05 % Apply 1 Application topically 2 (two) times daily for 14 days.   [DISCONTINUED] ramelteon (ROZEREM)  8 MG tablet Take 1 tablet (8 mg total) by mouth at bedtime.   [DISCONTINUED] rizatriptan (MAXALT) 5 MG tablet TAKE 1 TABLET (5 MG TOTAL) BY MOUTH AS NEEDED FOR MIGRAINE. MAY REPEAT IN 2 HOURS IF NEEDED   [DISCONTINUED] Semaglutide-Weight Management (WEGOVY) 2.4 MG/0.75ML SOAJ Inject 2.4 mg into the skin once a week in the morning (Patient not taking: Reported on 10/22/2022)   No facility-administered encounter medications on file as of 10/22/2022.   ALLERGIES: No Known Allergies  VACCINATION STATUS: Immunization History  Administered Date(s) Administered   Influenza,inj,Quad PF,6+ Mos 12/09/2019, 11/23/2021    HPI Martha [name] is 42 y.o. female who presents today with a medical history as above. she is being seen in consultation for weight management and dyslipidemia. History is obtained directly from the patient and review of her medical records. She reports hx of being overweight prior to 2020. Over the recent years , she progressively lost weight. Her current  weight is 143 lbs with BMI of 23.93. She is not currently on any pharmaceuticals. She would like to achieve some weight loss with lifestyle medicine. She would like to minimize medications. She is following low calorie diet. She does not have acute symptoms. She denies hx of diabetes, HTN, HPL. She reports personal hx of MS.  She has family hx type 2 DM in both of her parents.  Review of Systems  Constitutional: +mildly fluctuating body weight, no fatigue, no subjective hyperthermia, no subjective hypothermia Eyes: no blurry vision, no xerophthalmia ENT: no sore throat, no nodules palpated in throat, no dysphagia/odynophagia, no hoarseness Cardiovascular: no Chest Pain, no Shortness of Breath, no palpitations, no leg swelling Respiratory: no cough, no shortness of breath Gastrointestinal: no Nausea/Vomiting/Diarhhea Musculoskeletal: no muscle/joint aches Skin: no rashes Neurological:  no tremors, no numbness, no tingling, no dizziness Psychiatric: no depression, no anxiety  Objective:       10/22/2022   11:29 AM 10/10/2022    1:03 PM 08/30/2022   10:25 AM  Vitals with BMI  Height 5\' 5"  5\' 5"  5\' 5"   Weight 143 lbs 13 oz 140 lbs 145 lbs  BMI 23.93 23.3 24.13  Systolic 112 120 161  Diastolic 78 82 78  Pulse 76  60    BP 112/78   Pulse 76   Ht 5\' 5"  (1.651 m)   Wt 143 lb 12.8 oz (65.2 kg)   BMI 23.93 kg/m   Wt Readings from Last 3 Encounters:  10/22/22 143 lb 12.8 oz (65.2 kg)  10/10/22 140 lb (63.5 kg)  08/30/22 145 lb (65.8 kg)    Physical Exam  Constitutional:  Body mass index is 23.93 kg/m.,  not in acute distress, normal state of mind Eyes: PERRLA, EOMI, no exophthalmos ENT: moist mucous membranes, no gross thyromegaly, no gross cervical lymphadenopathy Cardiovascular: normal precordial activity, Regular Rate and Rhythm, no Murmur/Rubs/Gallops Respiratory:  adequate breathing efforts, no gross chest deformity, Clear to auscultation bilaterally Gastrointestinal:  abdomen soft, Non -tender, No distension, Bowel Sounds present, no gross organomegaly Musculoskeletal: no gross deformities, strength intact in all four extremities, no peripheral edema Skin: moist, warm, no rashes Neurological: no tremor with outstretched hands, Deep tendon reflexes normal in bilateral lower extremities.  CMP ( most recent) CMP     Component Value Date/Time   NA 141 05/28/2022 0903   K 4.1 05/28/2022 0903   CL 103 05/28/2022 0903   CO2 20 05/28/2022 0903   GLUCOSE 73 05/28/2022 0903   GLUCOSE 125 (H) 10/12/2019 0005  BUN 14 05/28/2022 0903   CREATININE 0.61 05/28/2022 0903   CALCIUM 9.2 05/28/2022 0903   PROT 6.9 05/28/2022 0903   ALBUMIN 4.4 05/28/2022 0903   AST 14 05/28/2022 0903   ALT 20 05/28/2022 0903   ALKPHOS 47 05/28/2022 0903   BILITOT 0.5 05/28/2022 0903   EGFR 115 05/28/2022 0903   GFRNONAA >60 10/12/2019 0005     Lipid Panel ( most recent) Lipid Panel     Component Value Date/Time   CHOL 178 05/28/2022 0903   TRIG 54 05/28/2022 0903   HDL 79 05/28/2022 0903   CHOLHDL 2.3 05/28/2022 0903   LDLCALC 88 05/28/2022 0903   LABVLDL 11 05/28/2022 0903      Assessment & Plan:   1. Dyslipidemia 2. Encounter for weight management  - Martha [name]  is being seen as a new patient to discuss Lifestyle Medicine. - I have reviewed her available  records and clinically evaluated the patient. - Based on these reviews, she has hx of high BMI , mild dyslipidemia. In light of her desire to engage in Lifestyle Medicine, she will be provided LM package.  - she acknowledges that there is a room for improvement in her food and drink choices. - Suggestion is made for her to avoid simple carbohydrates  from her diet including Cakes, Sweet Desserts, Ice Cream, Soda (diet and regular), Sweet Tea, Candies, Chips, Cookies, Store Bought Juices, Alcohol , Artificial Sweeteners,  Coffee Creamer, and "Sugar-free" Products, Lemonade. This will help patient to have  more stable blood glucose profile and potentially avoid unintended weight gain.  The following Lifestyle Medicine recommendations according to American College of Lifestyle Medicine  [facility]) were discussed and and offered to patient and she  agrees to start the journey:  A. Whole Foods, Plant-Based Nutrition comprising of fruits and vegetables, plant-based proteins, whole-grain carbohydrates was discussed in detail with the patient.   A list for source of those nutrients were also provided to the patient.  Patient will use only water or unsweetened tea for hydration. B.  The need to stay away from risky substances including alcohol, smoking; obtaining 7 to 9 hours of restorative sleep, at least 150 minutes of moderate intensity exercise weekly, the importance of healthy social connections,  and stress management techniques were discussed. C.  A full color page of  Calorie density of various food groups per pound showing examples of each food groups was provided to the patient.  Her POC A1c is 5% indicating absence of prediabetes/diabetes. She will return in 6 months with repeat labs: - TSH - T4, free - Lipid panel - Comprehensive metabolic panel    -Thank you for involving me in the care of this pleasant patient.  Time spent with the patient: 45  minutes, of which >50% was spent in  counseling her about her dyslipidemia, hx of high BMI and the rest in obtaining information about her symptoms, reviewing her previous labs/studies ( including abstractions from other facilities),  evaluations, and treatments,  and developing a plan to confirm diagnosis and long term treatment based on the latest standards of care/guidelines; and documenting her care.  Corey Wojcicki participated in the discussions, expressed understanding, and voiced agreement with the above plans.  All questions were answered to her satisfaction. she is encouraged to contact clinic should she have any questions or concerns prior to  her return visit.  Follow up plan: Return in about 6 months (around 04/24/2023) for Fasting Labs  in AM B4 8.  Marquis Lunch, MD [facility] [address] [place], [place] [address] Phone: [phone]  Fax: [phone]     10/22/2022, 2:44 PM  This note was partially dictated with voice recognition software. Similar sounding words can be transcribed inadequately or may not  be corrected upon review.

## 2022-10-22 NOTE — Patient Instructions (Signed)

## 2022-10-23 LAB — POCT GLYCOSYLATED HEMOGLOBIN (HGB A1C): HbA1c POC (<> result, manual entry): 5 % (ref 4.0–5.6)

## 2022-10-23 NOTE — Addendum Note (Signed)
Addended by: Derrell Lolling on: 10/23/2022 08:35 AM   Modules accepted: Orders

## 2022-10-25 ENCOUNTER — Ambulatory Visit: Payer: 59 | Admitting: "Endocrinology

## 2022-10-29 ENCOUNTER — Ambulatory Visit: Payer: 59 | Admitting: Podiatry

## 2022-11-19 DIAGNOSIS — Z0001 Encounter for general adult medical examination with abnormal findings: Secondary | ICD-10-CM | POA: Diagnosis not present

## 2022-11-19 DIAGNOSIS — Z01419 Encounter for gynecological examination (general) (routine) without abnormal findings: Secondary | ICD-10-CM | POA: Diagnosis not present

## 2022-11-19 DIAGNOSIS — E669 Obesity, unspecified: Secondary | ICD-10-CM | POA: Diagnosis not present

## 2022-11-19 DIAGNOSIS — Z1231 Encounter for screening mammogram for malignant neoplasm of breast: Secondary | ICD-10-CM | POA: Diagnosis not present

## 2022-11-20 ENCOUNTER — Other Ambulatory Visit: Payer: Self-pay | Admitting: Obstetrics and Gynecology

## 2022-11-20 DIAGNOSIS — R921 Mammographic calcification found on diagnostic imaging of breast: Secondary | ICD-10-CM

## 2022-11-22 ENCOUNTER — Other Ambulatory Visit (HOSPITAL_COMMUNITY): Payer: Self-pay

## 2022-11-26 NOTE — Progress Notes (Deleted)
  [name] [address] [place] [address] Phone: [phone] Subjective:    I'm seeing this patient by the request  of:  Patient, No Pcp Per  CC:   [id number]  Martha [name] is a 42 y.o. female coming in with complaint of back and neck pain. OMT 10/10/2022. Patient states   Medications patient has been prescribed: None  Taking:         Reviewed prior external information including notes and imaging from previsou exam, outside providers and external EMR if available.   As well as notes that were available from care everywhere and other healthcare systems.  Past medical history, social, surgical and family history all reviewed in electronic medical record.  No pertanent information unless stated regarding to the chief complaint.   Past Medical History:  Diagnosis Date   Asthma    Lichen planus    MS (multiple sclerosis) (HCC)    PCOS (polycystic ovarian syndrome)    Raynaud's disease     No Known Allergies   Review of Systems:  No headache, visual changes, nausea, vomiting, diarrhea, constipation, dizziness, abdominal pain, skin rash, fevers, chills, night sweats, weight loss, swollen lymph nodes, body aches, joint swelling, chest pain, shortness of breath, mood changes. POSITIVE muscle aches  Objective  There were no vitals taken for this visit.   General: No apparent distress alert and oriented x3 mood and affect normal, dressed appropriately.  HEENT: Pupils equal, extraocular movements intact  Respiratory: Patient's speak in full sentences and does not appear short of breath  Cardiovascular: No lower extremity edema, non tender, no erythema  Gait MSK:  Back   Osteopathic findings  C2 flexed rotated and side bent right C6 flexed rotated and side bent left T3 extended rotated and side bent right inhaled rib T9 extended rotated and side bent left L2 flexed rotated and side bent right Sacrum right on right        Assessment and Plan:  No problem-specific Assessment & Plan notes found for this encounter.    Nonallopathic problems  Decision today to treat with OMT was based on Physical Exam  After verbal consent patient was treated with HVLA, ME, FPR techniques in cervical, rib, thoracic, lumbar, and sacral  areas  Patient tolerated the procedure well with improvement in symptoms  Patient given exercises, stretches and lifestyle modifications  See medications in patient instructions if given  Patient will follow up in 4-8 weeks             Note: This dictation was prepared with Dragon dictation along with smaller phrase technology. Any transcriptional errors that result from this process are unintentional.

## 2022-11-27 ENCOUNTER — Ambulatory Visit: Payer: 59 | Admitting: Family Medicine

## 2022-11-29 ENCOUNTER — Other Ambulatory Visit (HOSPITAL_COMMUNITY): Payer: Self-pay

## 2022-11-29 MED ORDER — ONDANSETRON 4 MG PO TBDP
8.0000 mg | ORAL_TABLET | Freq: Two times a day (BID) | ORAL | 1 refills | Status: AC
  Filled 2022-11-29: qty 60, 15d supply, fill #0
  Filled 2023-02-14: qty 60, 15d supply, fill #1

## 2022-12-03 ENCOUNTER — Encounter: Payer: Self-pay | Admitting: Podiatry

## 2022-12-03 ENCOUNTER — Other Ambulatory Visit (HOSPITAL_COMMUNITY): Payer: Self-pay

## 2022-12-05 ENCOUNTER — Other Ambulatory Visit: Payer: Self-pay | Admitting: Obstetrics and Gynecology

## 2022-12-05 ENCOUNTER — Ambulatory Visit
Admission: RE | Admit: 2022-12-05 | Discharge: 2022-12-05 | Disposition: A | Discharge status: Home / Self Care | Payer: 59 | Source: Ambulatory Visit | Attending: Obstetrics and Gynecology | Admitting: Obstetrics and Gynecology

## 2022-12-05 DIAGNOSIS — R921 Mammographic calcification found on diagnostic imaging of breast: Secondary | ICD-10-CM

## 2022-12-05 DIAGNOSIS — R928 Other abnormal and inconclusive findings on diagnostic imaging of breast: Secondary | ICD-10-CM | POA: Diagnosis not present

## 2022-12-07 NOTE — Progress Notes (Unsigned)
  [name] [address] [place] [address] Phone: [phone] Subjective:    I'm seeing this patient by the request  of:  Patient, No Pcp Per  CC: back and neck pain   [id number]  Martha [name] is a 42 y.o. female coming in with complaint of back and neck pain. OMT on 10/10/2022. Patient states continues have some discomfort.  Has not had as much time to take care of himself secondary to stress with work moment.          Reviewed prior external information including notes and imaging from previsou exam, outside providers and external EMR if available.   As well as notes that were available from care everywhere and other healthcare systems.  Past medical history, social, surgical and family history all reviewed in electronic medical record.  No pertanent information unless stated regarding to the chief complaint.   Past Medical History:  Diagnosis Date   Asthma    Lichen planus    MS (multiple sclerosis) (HCC)    PCOS (polycystic ovarian syndrome)    Raynaud's disease     No Known Allergies   Review of Systems:  No headache, visual changes, nausea, vomiting, diarrhea, constipation, dizziness, abdominal pain, skin rash, fevers, chills, night sweats, weight loss, swollen lymph nodes, body aches, joint swelling, chest pain, shortness of breath, mood changes. POSITIVE muscle aches  Objective  Blood pressure 118/82, height 5\' 5"  (1.651 m), weight 144 lb (65.3 kg).   General: No apparent distress alert and oriented x3 mood and affect normal, dressed appropriately.  HEENT: Pupils equal, extraocular movements intact  Respiratory: Patient's speak in full sentences and does not appear short of breath  Cardiovascular: No lower extremity edema, non tender, no erythema  Neck exam does have some loss of lordosis.  Significant tightness in the thoracic area.  Negative Spurling's.  No dizziness with extension of the neck  Osteopathic  findings  C2 flexed rotated and side bent right C3 flexed rotated and side bent left T3 extended rotated and side bent right inhaled rib T4 extended rotated and side bent left L2 flexed rotated and side bent right L4 flexed rotated and side bent left Sacrum right on right     Assessment and Plan:  Thoracic back pain Chronic problem with some exacerbation recently.  Discussed icing regimen and home exercises, which activities to do and which ones to avoid.  Likely still responded relatively well to osteopathic manipulation.  Discussed increasing activity slowly.  Discussed still ergonomics.  Discussed trying to find time for herself at the moment.  Follow-up again in 6 weeks    Nonallopathic problems  Decision today to treat with OMT was based on Physical Exam  After verbal consent patient was treated with HVLA, ME, FPR techniques in cervical, rib, thoracic, lumbar, and sacral  areas  Patient tolerated the procedure well with improvement in symptoms  Patient given exercises, stretches and lifestyle modifications  See medications in patient instructions if given  Patient will follow up in 4-8 weeks     The above documentation has been reviewed and is accurate and complete Judi Saa, DO         Note: This dictation was prepared with Dragon dictation along with smaller phrase technology. Any transcriptional errors that result from this process are unintentional.

## 2022-12-10 ENCOUNTER — Ambulatory Visit: Payer: 59 | Admitting: Podiatry

## 2022-12-10 ENCOUNTER — Ambulatory Visit (INDEPENDENT_AMBULATORY_CARE_PROVIDER_SITE_OTHER): Payer: 59 | Admitting: Neurology

## 2022-12-10 ENCOUNTER — Other Ambulatory Visit (HOSPITAL_COMMUNITY): Payer: Self-pay

## 2022-12-10 ENCOUNTER — Encounter: Payer: Self-pay | Admitting: Family Medicine

## 2022-12-10 ENCOUNTER — Ambulatory Visit (INDEPENDENT_AMBULATORY_CARE_PROVIDER_SITE_OTHER): Payer: 59 | Admitting: Family Medicine

## 2022-12-10 ENCOUNTER — Encounter: Payer: Self-pay | Admitting: Neurology

## 2022-12-10 VITALS — BP 118/82 | Ht 65.0 in | Wt 144.0 lb

## 2022-12-10 VITALS — BP 98/68 | HR 82 | Ht 65.0 in | Wt 144.5 lb

## 2022-12-10 DIAGNOSIS — M9904 Segmental and somatic dysfunction of sacral region: Secondary | ICD-10-CM | POA: Diagnosis not present

## 2022-12-10 DIAGNOSIS — G43001 Migraine without aura, not intractable, with status migrainosus: Secondary | ICD-10-CM

## 2022-12-10 DIAGNOSIS — R208 Other disturbances of skin sensation: Secondary | ICD-10-CM

## 2022-12-10 DIAGNOSIS — M9903 Segmental and somatic dysfunction of lumbar region: Secondary | ICD-10-CM

## 2022-12-10 DIAGNOSIS — G8929 Other chronic pain: Secondary | ICD-10-CM | POA: Diagnosis not present

## 2022-12-10 DIAGNOSIS — M9908 Segmental and somatic dysfunction of rib cage: Secondary | ICD-10-CM

## 2022-12-10 DIAGNOSIS — M546 Pain in thoracic spine: Secondary | ICD-10-CM

## 2022-12-10 DIAGNOSIS — M9901 Segmental and somatic dysfunction of cervical region: Secondary | ICD-10-CM

## 2022-12-10 DIAGNOSIS — Z79899 Other long term (current) drug therapy: Secondary | ICD-10-CM

## 2022-12-10 DIAGNOSIS — M9902 Segmental and somatic dysfunction of thoracic region: Secondary | ICD-10-CM

## 2022-12-10 DIAGNOSIS — G35 Multiple sclerosis: Secondary | ICD-10-CM

## 2022-12-10 MED ORDER — ZOLPIDEM TARTRATE 10 MG PO TABS
5.0000 mg | ORAL_TABLET | Freq: Every day | ORAL | 5 refills | Status: DC
Start: 2022-12-10 — End: 2023-06-24
  Filled 2022-12-10 – 2023-02-14 (×2): qty 30, 30d supply, fill #0
  Filled 2023-04-22: qty 30, 30d supply, fill #1

## 2022-12-10 NOTE — Progress Notes (Signed)
[facility]   PATIENT: Martha [name] DOB: 1980/10/13  REFERRING DOCTOR OR PCP:  Charm Barges [facility] Neurology) SOURCE: Patient, notes from neurology, laboratory reports, imaging reports, MRI images personally reviewed.  _________________________________   HISTORICAL  CHIEF COMPLAINT:  Chief Complaint  Patient presents with   Follow-up    Pt in room 10. Here for MS follow up. Pt reports doing well.  Pt reports headaches are about 3-5 per month. Stopped Imipramine caused dry mouth, doesn't believe it helped much.     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 woman with multiple sclerosis.  Update 12/10/2022:  She is on Vumerity and tolerates it well. She flushes if takes without food.   No GI issues.  She denies exacerbations.     Gait and balance are good on a flat surface.  She had one fall in 2022 on stairs so she usually holds the bannister.  She has mild sensation like water running down calves or vibrating but these are not too bad.   Vision is better than 20/20.   ON was her first exacerbatin in 2009   Color vision is fine  She reports short spells of vertigo just lasting seconds, often in trains.  Other days have no episodes.  They often ocur while laying down in bed.    She is noting more headaches - frontal and around eye.  She often wakes up with them but they improve as they wake up.    They occur 2-6 days a week (5-10 a month for 4 or more hours). Often with migrainous features (Nausea, phonophobia) and she takes a Maxalt     Some are triggered by barometric change.   She has not tried an oral anti-CGRP agent.   She tried Topamax in the past with no benefit.    Imipramine caused dry mouth so she stopped  Her sleep quality is often poor but she does not snore.  She has both sleep onset and maintenance issues.   Ambien 6.25 mg helps her fall asleep but she wakes up 4 hours later. She takes just a couple nights/week   Doxepin  sometimes helps her stay asleep and other times not.   Trazodone caused nasal congestant and she stopped.    She changed jobs and now is working days only.     Fatigue is doing ok.  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   Vision is fine.  Bladder is fine.   She had ON near the time of diagnosis in 2009.  She saw Dr. Dione Booze recently and her exam was fine.       She has lost 70 pounds on purpose, helped by semaglutide and diet.       She has Raynaud's, left > right hand.  She wears heated socks which helps feet.   She has had this for many years but the episodes are more painful recently.  She generally does worse during winter than summer.   MS History She was diagnosed with MS in 2009 after presenting with right optic neuritis.  She initially saw ophthalmology and was referred to neuro-ophthalmology (Dr. Augustine Radar).  After an MRI showed changes consistent with MS, she was referred to Dr. Terrilyn Saver, at [facility] neurology.     She was placed on Avonex and tolerated it well.  Due to LFT elevation, she switched to Copaxone.    She had tolerability issues and switched to Tecfidera in 2013.  She had some mild liver elevation.  Her MS was stable on all of these medications.  She stopped Tecfidera in 2017 due to family planning issues.    She has not been able to get pregnant and is ready to restart therapy.  She started Vumerity August 2021.  She has low vitamin D and takes supplements weekly.       Imaging:  MRI of the brain 12/05/2018 showed multiple T2/FLAIR hyperintense foci, predominantly in the periventricular white matter radially oriented to the ventricles.  A couple foci were also noted in the juxtacortical white matter, possibly a couple punctate foci in the pons, deep white matter and one focus in the right cerebellar hemisphere.  None of the foci enhanced.  There were no films for  comparison.    She reports a cervical spine MRI in 2013 did not show any plaque.      MRI lumbar in 2015 showed mild facet hypertrophy at a few levels.     MRI brain 11/03/2019 showed multiple T2/FLAIR hyperintense foci in the periventricular, juxtacortical and deep white matter of both hemispheres consistent with demyelinating plaque associated with multiple sclerosis. Most of the foci were present on the 12/05/2018 MRI. However, there is one new juxtacortical focus in the right hemisphere. That focus also enhances after contrast implying a more acute or subacute timeframe.  MRI cervical spine 11/03/2019 showed a normal spinal cord and no significant DJD.  OTHER: She had Lichen Planus on the left arm and has a sore in her mouth potentially the same diagnosis.  She is going to see an oral surgeon for possible biopsy.   REVIEW OF SYSTEMS: Constitutional: No fevers, chills, sweats, or change in appetite Eyes: No visual changes, double vision, eye pain Ear, nose and throat: No hearing loss, ear pain, nasal congestion, sore throat.  She currently has a sore in her mouth potentially worrisome for lichen planus Cardiovascular: No chest pain, palpitations Respiratory:  No shortness of breath at rest or with exertion.   No wheezes GastrointestinaI: No nausea, vomiting, diarrhea, abdominal pain, fecal incontinence Genitourinary:  No dysuria, urinary retention or frequency.  No nocturia. Musculoskeletal:  No neck pain.  She has some back pain. Integumentary: No rash, pruritus.  She had lichen planus in the left arm Neurological: as above Psychiatric: No depression at this time.  No anxiety Endocrine: No palpitations, diaphoresis, change in appetite, change in weigh or increased thirst Hematologic/Lymphatic:  No anemia, purpura, petechiae. Allergic/Immunologic: No itchy/runny eyes, nasal congestion, recent allergic reactions, rashes  ALLERGIES: No Known Allergies  HOME MEDICATIONS:  Current  Outpatient Medications:    imipramine (TOFRANIL) 25 MG tablet, Take 1-2 tablets (25-50 mg total) by mouth at bedtime., Disp: 60 tablet, Rfl: 11   ondansetron (ZOFRAN) 4 MG tablet, Take 1 tablet (4 mg total) by mouth 2 (two) times daily as needed., Disp: 60 tablet, Rfl: 3   ondansetron (ZOFRAN-ODT) 4 MG disintegrating tablet, Dissolve 2 tablets (8 mg total) in the mouth 2 (two) times daily., Disp: 60 tablet, Rfl: 1   VUMERITY 231 MG CPDR, Take 462 mg by mouth in the morning and at bedtime., Disp: 360 capsule, Rfl: 3   amLODipine (NORVASC) 2.5 MG tablet, Take 1 tablet (2.5 mg total) by mouth daily. (Patient not taking: Reported on 12/10/2022), Disp: 90 tablet, Rfl: 0   ondansetron (ZOFRAN) 4 MG tablet, Take  1 tablet (4 mg total) by mouth 2 (two) times daily as needed. (Patient not taking: Reported on 12/10/2022), Disp: 60 tablet, Rfl: 3   tretinoin (RETIN-A) 0.025 % cream, Apply 1 application to face externally once a day in the evening (Patient not taking: Reported on 12/10/2022), Disp: 20 g, Rfl: 0   zolpidem (AMBIEN) 10 MG tablet, Take 1/2-1 tablet (5-10 mg total) by mouth at bedtime., Disp: 30 tablet, Rfl: 5  PAST MEDICAL HISTORY: Past Medical History:  Diagnosis Date   Asthma    Lichen planus    MS (multiple sclerosis) (HCC)    PCOS (polycystic ovarian syndrome)    Raynaud's disease     PAST SURGICAL HISTORY: Past Surgical History:  Procedure Laterality Date   colonoscopy     DILATION AND CURETTAGE OF UTERUS     x 3   ESOPHAGOGASTRODUODENOSCOPY     TONSILLECTOMY AND ADENOIDECTOMY      FAMILY HISTORY: Family History  Problem Relation Age of Onset   Diabetes Mother    Thyroid disease Mother    Hypertension Mother    Hypercholesterolemia Mother    Migraines Mother    Depression Mother    Diabetes Father    Thyroid disease Father    CAD Father    Hypercholesterolemia Father    Hypertension Father    Depression Father    Pernicious anemia Father    Breast cancer Paternal Aunt      SOCIAL HISTORY:  Social History   Socioeconomic History   Marital status: Married    Spouse name: Not on file   Number of children: 0   Years of education: college   Highest education level: Professional school degree (e.g., MD, DDS, DVM, JD)  Occupational History   Occupation: physician  Tobacco Use   Smoking status: Never   Smokeless tobacco: Never  Vaping Use   Vaping status: Never Used  Substance and Sexual Activity   Alcohol use: No   Drug use: No   Sexual activity: Not on file  Other Topics Concern   Not on file  Social History Narrative   Right handed   1-2 cups caffeine daily   Lives at home with husband   Social Determinants of Health   Financial Resource Strain: Not on file  Food Insecurity: Not on file  Transportation Needs: Not on file  Physical Activity: Not on file  Stress: Not on file  Social Connections: Not on file  Intimate Partner Violence: Not on file     PHYSICAL EXAM  Vitals:   12/10/22 0815  BP: 98/68  Pulse: 82  Weight: 144 lb 8 oz (65.5 kg)  Height: 5\' 5"  (1.651 m)    Body mass index is 24.05 kg/m.   General: The patient is well-developed and well-nourished and in no acute distress  HEENT:  Head is [place]/AT.  Sclera are anicteric.    Skin: Extremities are without rash or  edema.  Neurologic Exam  Mental status: The patient is alert and oriented x 3 at the time of the examination. The patient has apparent normal recent and remote memory, with an apparently normal attention span and concentration ability.   Speech is normal.  Cranial nerves: Extraocular movements are full.     Facial strength is symmetric.  No obvious hearing deficits are noted.  Motor:  Muscle bulk is normal.   Tone is normal. Strength is  5 / 5 in all 4 extremities.   Sensory: She had normal sensation to touch and  vibration in the arms and legs.    Coordination: Cerebellar testing reveals good finger-nose-finger and heel-to-shin bilaterally.  Gait  and station: Station is normal.   Gait is normal. Tandem is minimally wide.   Romberg is normal. Reflexes: Deep tendon reflexes are symmetric and normal bilaterally.        DIAGNOSTIC DATA (LABS, IMAGING, TESTING) - I reviewed patient records, labs, notes, testing and imaging myself where available.  Lab Results  Component Value Date   WBC 3.8 05/28/2022   HGB 12.2 05/28/2022   HCT 40.7 05/28/2022   MCV 78 (L) 05/28/2022   PLT 356 05/28/2022      Component Value Date/Time   NA 141 05/28/2022 0903   K 4.1 05/28/2022 0903   CL 103 05/28/2022 0903   CO2 20 05/28/2022 0903   GLUCOSE 73 05/28/2022 0903   GLUCOSE 125 (H) 10/12/2019 0005   BUN 14 05/28/2022 0903   CREATININE 0.61 05/28/2022 0903   CALCIUM 9.2 05/28/2022 0903   PROT 6.9 05/28/2022 0903   ALBUMIN 4.4 05/28/2022 0903   AST 14 05/28/2022 0903   ALT 20 05/28/2022 0903   ALKPHOS 47 05/28/2022 0903   BILITOT 0.5 05/28/2022 0903   GFRNONAA >60 10/12/2019 0005   GFRAA >60 10/12/2019 0005       ASSESSMENT AND PLAN  Multiple sclerosis (HCC) - Plan: CBC with Differential/Platelet, MR BRAIN W WO CONTRAST  High risk medication use - Plan: CBC with Differential/Platelet  Migraine without aura and with status migrainosus, not intractable  Dysesthesia   1.  Continue Vumerity.  Check labs (once had lymphocytes of 0.6 and once 0.7 but last was 1.3 in March 2024).  We will check an MRI of the brain to determine if there is any breakthrough activity and consider a different disease modifying therapy if this is occurring. 2.   for episodic migraines   Continue prn Maxalt.   Trial of Nurtec  [id number]  07/2024 3.   Change CR to IR Ambien.  4.   Return to see me in 6 months or sooner for new or worsening neurologic symptoms.   Dante Roudebush A. Epimenio Foot, MD, [facility] 12/10/2022, 12:13 PM Certified in Neurology, Clinical Neurophysiology, Sleep Medicine and Neuroimaging  [facility] [address] [place], [place] [address] [phone]

## 2022-12-10 NOTE — Patient Instructions (Signed)
See me in 6-8 weeks 

## 2022-12-10 NOTE — Assessment & Plan Note (Signed)
Chronic problem with some exacerbation recently.  Discussed icing regimen and home exercises, which activities to do and which ones to avoid.  Likely still responded relatively well to osteopathic manipulation.  Discussed increasing activity slowly.  Discussed still ergonomics.  Discussed trying to find time for herself at the moment.  Follow-up again in 6 weeks

## 2022-12-11 ENCOUNTER — Ambulatory Visit
Admission: RE | Admit: 2022-12-11 | Discharge: 2022-12-11 | Disposition: A | Discharge status: Home / Self Care | Payer: 59 | Source: Ambulatory Visit | Attending: Obstetrics and Gynecology | Admitting: Obstetrics and Gynecology

## 2022-12-11 ENCOUNTER — Other Ambulatory Visit: Payer: Self-pay | Admitting: Diagnostic Radiology

## 2022-12-11 DIAGNOSIS — N6323 Unspecified lump in the left breast, lower outer quadrant: Secondary | ICD-10-CM | POA: Diagnosis not present

## 2022-12-11 DIAGNOSIS — R921 Mammographic calcification found on diagnostic imaging of breast: Secondary | ICD-10-CM

## 2022-12-11 DIAGNOSIS — N6321 Unspecified lump in the left breast, upper outer quadrant: Secondary | ICD-10-CM | POA: Diagnosis not present

## 2022-12-11 DIAGNOSIS — N6012 Diffuse cystic mastopathy of left breast: Secondary | ICD-10-CM | POA: Diagnosis not present

## 2022-12-11 HISTORY — PX: BREAST BIOPSY: SHX20

## 2022-12-11 LAB — CBC WITH DIFFERENTIAL/PLATELET
Basophils Absolute: 0 10*3/uL (ref 0.0–0.2)
Basos: 1 %
EOS (ABSOLUTE): 0.1 10*3/uL (ref 0.0–0.4)
Eos: 1 %
Hematocrit: 42.2 % (ref 34.0–46.6)
Hemoglobin: 12.8 g/dL (ref 11.1–15.9)
Immature Grans (Abs): 0 10*3/uL (ref 0.0–0.1)
Immature Granulocytes: 1 %
Lymphocytes Absolute: 0.9 10*3/uL (ref 0.7–3.1)
Lymphs: 20 %
MCH: 23.8 pg — ABNORMAL LOW (ref 26.6–33.0)
MCHC: 30.3 g/dL — ABNORMAL LOW (ref 31.5–35.7)
MCV: 79 fL (ref 79–97)
Monocytes Absolute: 0.5 10*3/uL (ref 0.1–0.9)
Monocytes: 13 %
Neutrophils Absolute: 2.7 10*3/uL (ref 1.4–7.0)
Neutrophils: 64 %
Platelets: 377 10*3/uL (ref 150–450)
RBC: 5.37 x10E6/uL — ABNORMAL HIGH (ref 3.77–5.28)
RDW: 14.5 % (ref 11.7–15.4)
WBC: 4.3 10*3/uL (ref 3.4–10.8)

## 2022-12-12 LAB — SURGICAL PATHOLOGY

## 2022-12-18 ENCOUNTER — Other Ambulatory Visit: Payer: Self-pay

## 2022-12-18 ENCOUNTER — Other Ambulatory Visit: Payer: 59

## 2022-12-18 MED ORDER — COMIRNATY 30 MCG/0.3ML IM SUSY
0.3000 mL | PREFILLED_SYRINGE | Freq: Once | INTRAMUSCULAR | 0 refills | Status: AC
  Filled 2022-12-18: qty 0.3, 1d supply, fill #0

## 2022-12-18 MED ORDER — FLULAVAL 0.5 ML IM SUSY
0.5000 mL | PREFILLED_SYRINGE | Freq: Once | INTRAMUSCULAR | 0 refills | Status: AC
  Filled 2022-12-18: qty 0.5, 1d supply, fill #0

## 2022-12-31 ENCOUNTER — Encounter: Payer: Self-pay | Admitting: Neurology

## 2023-01-03 ENCOUNTER — Ambulatory Visit
Admission: RE | Admit: 2023-01-03 | Discharge: 2023-01-03 | Disposition: A | Discharge status: Home / Self Care | Payer: 59 | Source: Ambulatory Visit | Attending: Neurology | Admitting: Neurology

## 2023-01-03 DIAGNOSIS — G35 Multiple sclerosis: Secondary | ICD-10-CM | POA: Diagnosis not present

## 2023-01-03 MED ORDER — GADOPICLENOL 0.5 MMOL/ML IV SOLN
6.0000 mL | Freq: Once | INTRAVENOUS | Status: AC | PRN
  Administered 2023-01-03: 6 mL via INTRAVENOUS

## 2023-01-07 ENCOUNTER — Encounter: Payer: Self-pay | Admitting: Podiatry

## 2023-01-07 ENCOUNTER — Other Ambulatory Visit (HOSPITAL_COMMUNITY): Payer: Self-pay

## 2023-01-07 ENCOUNTER — Ambulatory Visit (INDEPENDENT_AMBULATORY_CARE_PROVIDER_SITE_OTHER): Payer: 59 | Admitting: Podiatry

## 2023-01-07 DIAGNOSIS — L603 Nail dystrophy: Secondary | ICD-10-CM

## 2023-01-07 DIAGNOSIS — I7300 Raynaud's syndrome without gangrene: Secondary | ICD-10-CM

## 2023-01-07 MED ORDER — "NITROGLYCERIN NICU 2% OINTMENT "
1.0000 | TOPICAL_OINTMENT | Freq: Two times a day (BID) | TRANSDERMAL | 2 refills | Status: AC
  Filled 2023-01-07: qty 60, 30d supply, fill #0

## 2023-01-07 NOTE — Progress Notes (Signed)
  Subjective:  Patient ID: Martha [name], female    DOB: 11-Jun-1980,  MRN: [id number]  Chief Complaint  Patient presents with   Nail Problem    "I have a nail that has grown underneath a nail."    42 y.o. female presents with the above complaint. History confirmed with patient.  Her Raynaud's has been flaring as well she takes amlodipine for this.  The nail seems like it was growing out and she was able to remove part of the loose portions on the right side  Objective:  Physical Exam: Toes are cool to touch, no trophic changes or ulcerative lesions, normal DP and PT pulses, normal sensory exam, and areas of onycholysis and transverse Beau lines present both nails  Assessment:   1. Nail dystrophy   2. Raynaud's disease without gangrene      Plan:  Patient was evaluated and treated and all questions answered.  For Raynaud's that she has been taking amlodipine but still has issues with it and has pain in the toes some.  I prescribed  Nitro-Bid paste to use as needed to see if this alleviates any of this.  We discussed the possibility of headache while using it.  Regarding her nail dystrophy I recommended analysis of the nail plate and a portion of the distal lateral right hallux nail was removed and sent for PCR.  If there is presence of onychomycosis then we will treat accordingly.  She does not have major risk factors for pressure induced microtrauma as she works from home and wears appropriate shoe gear.  Return if symptoms worsen or fail to improve.

## 2023-01-08 ENCOUNTER — Other Ambulatory Visit (HOSPITAL_COMMUNITY): Payer: Self-pay

## 2023-01-09 ENCOUNTER — Other Ambulatory Visit (HOSPITAL_COMMUNITY): Payer: Self-pay

## 2023-01-11 ENCOUNTER — Other Ambulatory Visit (HOSPITAL_COMMUNITY): Payer: Self-pay

## 2023-01-14 ENCOUNTER — Other Ambulatory Visit: Payer: Self-pay | Admitting: Podiatry

## 2023-01-14 DIAGNOSIS — L603 Nail dystrophy: Secondary | ICD-10-CM

## 2023-01-17 NOTE — Progress Notes (Signed)
  [name] [address] [place] [address] Phone: [phone] Subjective:   INadine Counts, am serving as a scribe for Dr. Antoine Primas.  I'm seeing this patient by the request  of:  Patient, No Pcp Per  CC: back and neck pain follow up   PIR:[id number]  Martha [name] is a 42 y.o. female coming in with complaint of back and neck pain. OMT on 12/10/2022. Patient states same per usual. No new concerns.           Reviewed prior external information including notes and imaging from previsou exam, outside providers and external EMR if available.   As well as notes that were available from care everywhere and other healthcare systems.  Past medical history, social, surgical and family history all reviewed in electronic medical record.  No pertanent information unless stated regarding to the chief complaint.   Past Medical History:  Diagnosis Date   Asthma    Lichen planus    MS (multiple sclerosis) (HCC)    PCOS (polycystic ovarian syndrome)    Raynaud's disease     No Known Allergies   Review of Systems:  No headache, visual changes, nausea, vomiting, diarrhea, constipation, dizziness, abdominal pain, skin rash, fevers, chills, night sweats, weight loss, swollen lymph nodes, body aches, joint swelling, chest pain, shortness of breath, mood changes. POSITIVE muscle aches  Objective  Blood pressure 110/74, pulse 74, height 5\' 5"  (1.651 m), weight 142 lb (64.4 kg), last menstrual period 12/15/2022, SpO2 95%.   General: No apparent distress alert and oriented x3 mood and affect normal, dressed appropriately.  HEENT: Pupils equal, extraocular movements intact  Respiratory: Patient's speak in full sentences and does not appear short of breath  Cardiovascular: No lower extremity edema, non tender, no erythema  Gait MSK:  Back does have some loss lordosis noted.  Does have some tightness noted in the paraspinal musculature.  Negative  straight leg test noted.  Osteopathic findings  C2 flexed rotated and side bent right C7 flexed rotated and side bent left T3 extended rotated and side bent right inhaled rib T9 extended rotated and side bent left L1 flexed rotated and side bent right Sacrum right on right     Assessment and Plan:  Thoracic back pain Chronic problem with mild exacerbation noted.  Discussed icing regimen and home exercises.  Discussed which activities to do and which ones to avoid.  Increasing activity slowly.  No sign of any type of multiple sclerosis.  Follow-up in 6 to 8 weeks    Nonallopathic problems  Decision today to treat with OMT was based on Physical Exam  After verbal consent patient was treated with HVLA, ME, FPR techniques in cervical, rib, thoracic, lumbar, and sacral  areas  Patient tolerated the procedure well with improvement in symptoms  Patient given exercises, stretches and lifestyle modifications  See medications in patient instructions if given  Patient will follow up in 4-8 weeks    The above documentation has been reviewed and is accurate and complete Judi Saa, DO          Note: This dictation was prepared with Dragon dictation along with smaller phrase technology. Any transcriptional errors that result from this process are unintentional.

## 2023-01-21 ENCOUNTER — Ambulatory Visit (INDEPENDENT_AMBULATORY_CARE_PROVIDER_SITE_OTHER): Payer: 59 | Admitting: Family Medicine

## 2023-01-21 ENCOUNTER — Encounter: Payer: Self-pay | Admitting: Family Medicine

## 2023-01-21 VITALS — BP 110/74 | HR 74 | Ht 65.0 in | Wt 142.0 lb

## 2023-01-21 DIAGNOSIS — M546 Pain in thoracic spine: Secondary | ICD-10-CM

## 2023-01-21 DIAGNOSIS — M9902 Segmental and somatic dysfunction of thoracic region: Secondary | ICD-10-CM

## 2023-01-21 DIAGNOSIS — M9904 Segmental and somatic dysfunction of sacral region: Secondary | ICD-10-CM

## 2023-01-21 DIAGNOSIS — G8929 Other chronic pain: Secondary | ICD-10-CM

## 2023-01-21 DIAGNOSIS — M9908 Segmental and somatic dysfunction of rib cage: Secondary | ICD-10-CM | POA: Diagnosis not present

## 2023-01-21 DIAGNOSIS — M9901 Segmental and somatic dysfunction of cervical region: Secondary | ICD-10-CM

## 2023-01-21 DIAGNOSIS — M9903 Segmental and somatic dysfunction of lumbar region: Secondary | ICD-10-CM

## 2023-01-21 NOTE — Assessment & Plan Note (Signed)
Chronic problem with mild exacerbation noted.  Discussed icing regimen and home exercises.  Discussed which activities to do and which ones to avoid.  Increasing activity slowly.  No sign of any type of multiple sclerosis.  Follow-up in 6 to 8 weeks

## 2023-01-22 ENCOUNTER — Encounter: Payer: Self-pay | Admitting: Podiatry

## 2023-01-23 ENCOUNTER — Other Ambulatory Visit (HOSPITAL_COMMUNITY): Payer: Self-pay

## 2023-01-23 MED ORDER — TERBINAFINE HCL 250 MG PO TABS
250.0000 mg | ORAL_TABLET | Freq: Every day | ORAL | 0 refills | Status: AC
  Filled 2023-01-23: qty 90, 90d supply, fill #0

## 2023-02-08 ENCOUNTER — Other Ambulatory Visit: Payer: Self-pay

## 2023-02-14 ENCOUNTER — Encounter: Payer: Self-pay | Admitting: Family Medicine

## 2023-02-14 ENCOUNTER — Other Ambulatory Visit (HOSPITAL_COMMUNITY): Payer: Self-pay

## 2023-02-14 ENCOUNTER — Ambulatory Visit: Payer: 59 | Admitting: Family Medicine

## 2023-02-14 VITALS — BP 110/74 | Ht 65.0 in | Wt 136.0 lb

## 2023-02-14 DIAGNOSIS — M9903 Segmental and somatic dysfunction of lumbar region: Secondary | ICD-10-CM

## 2023-02-14 DIAGNOSIS — G8929 Other chronic pain: Secondary | ICD-10-CM

## 2023-02-14 DIAGNOSIS — M546 Pain in thoracic spine: Secondary | ICD-10-CM | POA: Diagnosis not present

## 2023-02-14 DIAGNOSIS — M9904 Segmental and somatic dysfunction of sacral region: Secondary | ICD-10-CM

## 2023-02-14 DIAGNOSIS — M9901 Segmental and somatic dysfunction of cervical region: Secondary | ICD-10-CM

## 2023-02-14 DIAGNOSIS — M9902 Segmental and somatic dysfunction of thoracic region: Secondary | ICD-10-CM | POA: Diagnosis not present

## 2023-02-14 DIAGNOSIS — M9908 Segmental and somatic dysfunction of rib cage: Secondary | ICD-10-CM | POA: Diagnosis not present

## 2023-02-14 MED ORDER — METHYLPREDNISOLONE ACETATE 80 MG/ML IJ SUSP
80.0000 mg | Freq: Once | INTRAMUSCULAR | Status: AC
Start: 2023-02-14 — End: 2023-02-14
  Administered 2023-02-14: 80 mg via INTRAMUSCULAR

## 2023-02-14 MED ORDER — KETOROLAC TROMETHAMINE 60 MG/2ML IM SOLN
60.0000 mg | Freq: Once | INTRAMUSCULAR | Status: AC
Start: 2023-02-14 — End: 2023-02-14
  Administered 2023-02-14: 60 mg via INTRAMUSCULAR

## 2023-02-14 NOTE — Patient Instructions (Signed)
Happy [facility] Injections in backside Can do inversion Keep appt in 2 weeks

## 2023-02-14 NOTE — Assessment & Plan Note (Addendum)
Low back pain.  Discussed icing regimen and home exercises, discussed which activities to do and which ones to avoid.  Increase activity slowly over the course of next several weeks.  Toradol and Depo-Medrol given today secondary to the severity.  Do not feel that this is secondary to her multiple sclerosis.  Discussed patient though that we will come back again in 2 weeks.

## 2023-02-14 NOTE — Progress Notes (Signed)
  [name] [address] [place] [address] Phone: [phone] Subjective:   Bruce Donath, am serving as a scribe for Dr. Antoine Primas.  I'm seeing this patient by the request  of:  Patient, No Pcp Per  CC:   [id number]  Martha [name] is a 42 y.o. female coming in with complaint of back and neck pain. OMT 01/21/2023. Pain increased one week. Patient states that she was reaching for something and she felt a spasm in lower back that radiated down the L leg.   Medications patient has been prescribed:   Taking:         Reviewed prior external information including notes and imaging from previsou exam, outside providers and external EMR if available.   As well as notes that were available from care everywhere and other healthcare systems.  Past medical history, social, surgical and family history all reviewed in electronic medical record.  No pertanent information unless stated regarding to the chief complaint.   Past Medical History:  Diagnosis Date   Asthma    Lichen planus    MS (multiple sclerosis) (HCC)    PCOS (polycystic ovarian syndrome)    Raynaud's disease     No Known Allergies   Review of Systems:  No headache, visual changes, nausea, vomiting, diarrhea, constipation, dizziness, abdominal pain, skin rash, fevers, chills, night sweats, weight loss, swollen lymph nodes, body aches, joint swelling, chest pain, shortness of breath, mood changes. POSITIVE muscle aches  Objective  There were no vitals taken for this visit.   General: No apparent distress alert and oriented x3 mood and affect normal, dressed appropriately.  HEENT: Pupils equal, extraocular movements intact  Respiratory: Patient's speak in full sentences and does not appear short of breath  Cardiovascular: No lower extremity edema, non tender, no erythema  Gait MSK:  Back   Osteopathic findings  C2 flexed rotated and side bent right C6 flexed  rotated and side bent left T3 extended rotated and side bent right inhaled rib T9 extended rotated and side bent left L2 flexed rotated and side bent right Sacrum right on right       Assessment and Plan:  No problem-specific Assessment & Plan notes found for this encounter.    Nonallopathic problems  Decision today to treat with OMT was based on Physical Exam  After verbal consent patient was treated with HVLA, ME, FPR techniques in cervical, rib, thoracic, lumbar, and sacral  areas  Patient tolerated the procedure well with improvement in symptoms  Patient given exercises, stretches and lifestyle modifications  See medications in patient instructions if given  Patient will follow up in 4-8 weeks             Note: This dictation was prepared with Dragon dictation along with smaller phrase technology. Any transcriptional errors that result from this process are unintentional.

## 2023-02-14 NOTE — Progress Notes (Signed)
[name] [address] [place] [address] Phone: [phone] Subjective:   INadine Counts, am serving as a scribe for Dr. Antoine Primas.  I'm seeing this patient by the request  of:  Patient, No Pcp Per  CC: Back and neck pain  [id number]  Martha [name] is a 42 y.o. female coming in with complaint of back and neck pain. Patient states discussed HEP patient states that unfortunately she did have an exacerbation of her back pain.  Started to affect daily activities.  Patient states all weekend was barely able to move off of her couch and had to use a heating pad.  Patient states that there was some radicular symptoms going down both legs.  Was going down even to the ankle area.  Rates the severity of pain is 8 out of 10.  Medications patient has been prescribed:   Taking:         Reviewed prior external information including notes and imaging from previsou exam, outside providers and external EMR if available.   As well as notes that were available from care everywhere and other healthcare systems.  Past medical history, social, surgical and family history all reviewed in electronic medical record.  No pertanent information unless stated regarding to the chief complaint.   Past Medical History:  Diagnosis Date   Asthma    Lichen planus    MS (multiple sclerosis) (HCC)    PCOS (polycystic ovarian syndrome)    Raynaud's disease     No Known Allergies   Review of Systems:  No headache, visual changes, nausea, vomiting, diarrhea, constipation, dizziness, abdominal pain, skin rash, fevers, chills, night sweats, weight loss, swollen lymph nodes, body aches, joint swelling, chest pain, shortness of breath, mood changes. POSITIVE muscle aches  Objective  Blood pressure 110/74, height 5\' 5"  (1.651 m), weight 136 lb (61.7 kg).   General: No apparent distress alert and oriented x3 mood and affect normal, dressed appropriately.  HEENT:  Pupils equal, extraocular movements intact  Respiratory: Patient's speak in full sentences and does not appear short of breath  Cardiovascular: No lower extremity edema, non tender, no erythema  Gait within normal MSK:  Back does have significant tightness in the lower back noted.  Tightness also does have a slipped rib on the right side noted today.  Osteopathic findings  C2 flexed rotated and side bent right C4 flexed rotated and side bent left C6 flexed rotated and side bent right T3 extended rotated and side bent right inhaled rib T9 extended rotated and side bent right inhaled rib L2 flexed rotated and side bent right Sacrum right on right     Assessment and Plan:  Thoracic back pain Low back pain.  Discussed icing regimen and home exercises, discussed which activities to do and which ones to avoid.  Increase activity slowly over the course of next several weeks.  Toradol and Depo-Medrol given today secondary to the severity.  Do not feel that this is secondary to her multiple sclerosis.  Discussed patient though that we will come back again in 2 weeks.    Nonallopathic problems  Decision today to treat with OMT was based on Physical Exam  After verbal consent patient was treated with HVLA, ME, FPR techniques in cervical, rib, thoracic, lumbar, and sacral  areas  Patient tolerated the procedure well with improvement in symptoms  Patient given exercises, stretches and lifestyle modifications  See medications in patient instructions if given  Patient will  follow up in 4-8 weeks    The above documentation has been reviewed and is accurate and complete Judi Saa, DO          Note: This dictation was prepared with Dragon dictation along with smaller phrase technology. Any transcriptional errors that result from this process are unintentional.

## 2023-02-15 ENCOUNTER — Other Ambulatory Visit: Payer: Self-pay

## 2023-02-15 ENCOUNTER — Other Ambulatory Visit (HOSPITAL_COMMUNITY): Payer: Self-pay

## 2023-02-15 ENCOUNTER — Ambulatory Visit: Payer: 59 | Admitting: Family Medicine

## 2023-02-25 NOTE — Progress Notes (Signed)
  [name] [address] [place] [address] Phone: [phone] Subjective:   Bruce Donath, am serving as a scribe for Dr. Antoine Primas.  I'm seeing this patient by the request  of:  Patient, No Pcp Per  CC: Back and neck pain follow-up  [id number]  Martha [name] is a 42 y.o. female coming in with complaint of back and neck pain. OMT 02/14/2023. Patient states that she is doing better. Lumbar spine is still painful though.   Medications patient has been prescribed: None  Taking:         Reviewed prior external information including notes and imaging from previsou exam, outside providers and external EMR if available.   As well as notes that were available from care everywhere and other healthcare systems.  Past medical history, social, surgical and family history all reviewed in electronic medical record.  No pertanent information unless stated regarding to the chief complaint.   Past Medical History:  Diagnosis Date   Asthma    Lichen planus    MS (multiple sclerosis) (HCC)    PCOS (polycystic ovarian syndrome)    Raynaud's disease     No Known Allergies   Review of Systems:  No headache, visual changes, nausea, vomiting, diarrhea, constipation, dizziness, abdominal pain, skin rash, fevers, chills, night sweats, weight loss, swollen lymph nodes, body aches, joint swelling, chest pain, shortness of breath, mood changes. POSITIVE muscle aches  Objective  Blood pressure 108/74, height 5\' 5"  (1.651 m), SpO2 100%.   General: No apparent distress alert and oriented x3 mood and affect normal, dressed appropriately.  HEENT: Pupils equal, extraocular movements intact  Respiratory: Patient's speak in full sentences and does not appear short of breath  Cardiovascular: No lower extremity edema, non tender, no erythema  Gait MSK:  Back does have some loss lordosis noted.  Some tenderness to palpation in the paraspinal  musculature.  Patient does have tightness noted in the thoracic spine as well.  Osteopathic findings  C2 flexed rotated and side bent right C6 flexed rotated and side bent left T3 extended rotated and side bent right inhaled rib T9 extended rotated and side bent left L2 flexed rotated and side bent right Sacrum right on right    Assessment and Plan:  Thoracic back pain Chronic problem but is improving some.  Discussed icing regimen and home exercises, which activities to do and which ones to avoid.  Increase activity slowly over follow-up again in 6 to 8 weeks.    Nonallopathic problems  Decision today to treat with OMT was based on Physical Exam  After verbal consent patient was treated with HVLA, ME, FPR techniques in cervical, rib, thoracic, lumbar, and sacral  areas  Patient tolerated the procedure well with improvement in symptoms  Patient given exercises, stretches and lifestyle modifications  See medications in patient instructions if given  Patient will follow up in 4-8 weeks    The above documentation has been reviewed and is accurate and complete Judi Saa, DO          Note: This dictation was prepared with Dragon dictation along with smaller phrase technology. Any transcriptional errors that result from this process are unintentional.

## 2023-03-01 ENCOUNTER — Encounter: Payer: Self-pay | Admitting: Family Medicine

## 2023-03-01 ENCOUNTER — Ambulatory Visit (INDEPENDENT_AMBULATORY_CARE_PROVIDER_SITE_OTHER): Payer: 59 | Admitting: Family Medicine

## 2023-03-01 VITALS — BP 108/74 | Ht 65.0 in

## 2023-03-01 DIAGNOSIS — M9902 Segmental and somatic dysfunction of thoracic region: Secondary | ICD-10-CM

## 2023-03-01 DIAGNOSIS — G8929 Other chronic pain: Secondary | ICD-10-CM | POA: Diagnosis not present

## 2023-03-01 DIAGNOSIS — M9901 Segmental and somatic dysfunction of cervical region: Secondary | ICD-10-CM | POA: Diagnosis not present

## 2023-03-01 DIAGNOSIS — M9904 Segmental and somatic dysfunction of sacral region: Secondary | ICD-10-CM | POA: Diagnosis not present

## 2023-03-01 DIAGNOSIS — M9908 Segmental and somatic dysfunction of rib cage: Secondary | ICD-10-CM

## 2023-03-01 DIAGNOSIS — M9903 Segmental and somatic dysfunction of lumbar region: Secondary | ICD-10-CM

## 2023-03-01 DIAGNOSIS — M546 Pain in thoracic spine: Secondary | ICD-10-CM | POA: Diagnosis not present

## 2023-03-01 NOTE — Patient Instructions (Signed)
[place] [address] your [address] See me in 5-6 weeks

## 2023-03-01 NOTE — Assessment & Plan Note (Signed)
Chronic problem but is improving some.  Discussed icing regimen and home exercises, which activities to do and which ones to avoid.  Increase activity slowly over follow-up again in 6 to 8 weeks.

## 2023-03-25 ENCOUNTER — Telehealth: Payer: Self-pay | Admitting: "Endocrinology

## 2023-03-25 ENCOUNTER — Other Ambulatory Visit: Payer: Self-pay | Admitting: *Deleted

## 2023-03-25 DIAGNOSIS — Z7689 Persons encountering health services in other specified circumstances: Secondary | ICD-10-CM

## 2023-03-25 DIAGNOSIS — E785 Hyperlipidemia, unspecified: Secondary | ICD-10-CM

## 2023-03-25 NOTE — Telephone Encounter (Signed)
Labs have been updated . 

## 2023-03-25 NOTE — Telephone Encounter (Signed)
Labs need to be updated.

## 2023-04-03 ENCOUNTER — Other Ambulatory Visit (HOSPITAL_COMMUNITY): Payer: Self-pay

## 2023-04-08 NOTE — Progress Notes (Signed)
 [name] [address] [place] [address] Phone: [phone] Subjective:   Martha [name], am serving as a scribe for Dr. Arthea Claudene.  I'm seeing this patient by the request  of:  Patient, No Pcp Per  CC: back and neck pain follow up   [id number]  Martha [name] is a 43 y.o. female coming in with complaint of back and neck pain. OMT on 03/01/2023. Patient states overall she has been doing relatively good, some increasing stress with work.  Patient is getting more responsibilities without the commitment from her employer to help her.         Reviewed prior external information including notes and imaging from previsou exam, outside providers and external EMR if available.   As well as notes that were available from care everywhere and other healthcare systems.  Past medical history, social, surgical and family history all reviewed in electronic medical record.  No pertanent information unless stated regarding to the chief complaint.   Past Medical History:  Diagnosis Date   Asthma    Lichen planus    MS (multiple sclerosis) (HCC)    PCOS (polycystic ovarian syndrome)    Raynaud's disease     No Known Allergies   Review of Systems:  No headache, visual changes, nausea, vomiting, diarrhea, constipation, dizziness, abdominal pain, skin rash, fevers, chills, night sweats, weight loss, swollen lymph nodes, joint swelling, chest pain, shortness of breath, mood changes. POSITIVE muscle aches, body aches  Objective  Blood pressure 108/78, pulse 77, height 5' 5 (1.651 m), weight 133 lb (60.3 kg), SpO2 98%.   General: No apparent distress alert and oriented x3 mood and affect normal, dressed appropriately.  HEENT: Pupils equal, extraocular movements intact  Respiratory: Patient's speak in full sentences and does not appear short of breath  Cardiovascular: No lower extremity edema, non tender, no erythema  MSK:  Back does have some  loss lordosis noted.  Some tenderness to palpation in the paraspinal musculature.  Tightness with FABER noted.  Patient does have more pain in the thoracolumbar junction than usual.  Osteopathic findings  C3 flexed rotated and side bent right C6 flexed rotated and side bent left T3 extended rotated and side bent right inhaled rib T11 extended rotated and side bent left L2 flexed rotated and side bent right L3 flexed rotated and side bent left Sacrum right on right     Assessment and Plan:  Thoracic back pain Multifactorial with exacerbation secondary to probably likely some stress recently.  This with patient about icing regimen and home exercises.  Patient will continue to work on ergonomics were possible.  Will try to find time for herself.  Hold on any new medications.  Follow-up again in 6 to 8 weeks for further evaluation and treatment    Nonallopathic problems  Decision today to treat with OMT was based on Physical Exam  After verbal consent patient was treated with HVLA, ME, FPR techniques in cervical, rib, thoracic, lumbar, and sacral  areas  Patient tolerated the procedure well with improvement in symptoms  Patient given exercises, stretches and lifestyle modifications  See medications in patient instructions if given  Patient will follow up in 4-8 weeks    The above documentation has been reviewed and is accurate and complete Martha Messenger M Geraline Halberstadt, DO          Note: This dictation was prepared with Dragon dictation along with smaller phrase technology. Any transcriptional errors that result from this  process are unintentional.

## 2023-04-10 ENCOUNTER — Other Ambulatory Visit (HOSPITAL_COMMUNITY): Payer: Self-pay

## 2023-04-10 ENCOUNTER — Ambulatory Visit (INDEPENDENT_AMBULATORY_CARE_PROVIDER_SITE_OTHER): Payer: 59 | Admitting: Family Medicine

## 2023-04-10 ENCOUNTER — Encounter (HOSPITAL_COMMUNITY): Payer: Self-pay

## 2023-04-10 VITALS — BP 108/78 | HR 77 | Ht 65.0 in | Wt 133.0 lb

## 2023-04-10 DIAGNOSIS — M9901 Segmental and somatic dysfunction of cervical region: Secondary | ICD-10-CM

## 2023-04-10 DIAGNOSIS — M9908 Segmental and somatic dysfunction of rib cage: Secondary | ICD-10-CM | POA: Diagnosis not present

## 2023-04-10 DIAGNOSIS — M9904 Segmental and somatic dysfunction of sacral region: Secondary | ICD-10-CM | POA: Diagnosis not present

## 2023-04-10 DIAGNOSIS — G8929 Other chronic pain: Secondary | ICD-10-CM | POA: Diagnosis not present

## 2023-04-10 DIAGNOSIS — M546 Pain in thoracic spine: Secondary | ICD-10-CM

## 2023-04-10 DIAGNOSIS — M9902 Segmental and somatic dysfunction of thoracic region: Secondary | ICD-10-CM

## 2023-04-10 DIAGNOSIS — M9903 Segmental and somatic dysfunction of lumbar region: Secondary | ICD-10-CM | POA: Diagnosis not present

## 2023-04-10 MED ORDER — ZEPBOUND 10 MG/0.5ML ~~LOC~~ SOAJ
10.0000 mg | SUBCUTANEOUS | 3 refills | Status: DC
  Filled 2023-04-10: qty 2, 28d supply, fill #0

## 2023-04-10 MED ORDER — ZEPBOUND 7.5 MG/0.5ML ~~LOC~~ SOAJ
7.5000 mg | SUBCUTANEOUS | 0 refills | Status: DC
Start: 2023-04-10 — End: 2023-06-24
  Filled 2023-04-10 (×2): qty 2, 28d supply, fill #0

## 2023-04-10 NOTE — Patient Instructions (Signed)
 Good to see you! See you again in 6 weeks

## 2023-04-11 ENCOUNTER — Encounter: Payer: Self-pay | Admitting: Family Medicine

## 2023-04-11 NOTE — Assessment & Plan Note (Signed)
 Multifactorial with exacerbation secondary to probably likely some stress recently.  This with patient about icing regimen and home exercises.  Patient will continue to work on ergonomics were possible.  Will try to find time for herself.  Hold on any new medications.  Follow-up again in 6 to 8 weeks for further evaluation and treatment

## 2023-04-23 ENCOUNTER — Other Ambulatory Visit: Payer: Self-pay

## 2023-04-29 ENCOUNTER — Ambulatory Visit: Payer: 59 | Admitting: "Endocrinology

## 2023-05-07 ENCOUNTER — Other Ambulatory Visit (HOSPITAL_COMMUNITY): Payer: Self-pay

## 2023-05-07 DIAGNOSIS — L578 Other skin changes due to chronic exposure to nonionizing radiation: Secondary | ICD-10-CM | POA: Diagnosis not present

## 2023-05-07 DIAGNOSIS — L821 Other seborrheic keratosis: Secondary | ICD-10-CM | POA: Diagnosis not present

## 2023-05-07 DIAGNOSIS — L814 Other melanin hyperpigmentation: Secondary | ICD-10-CM | POA: Diagnosis not present

## 2023-05-07 DIAGNOSIS — D229 Melanocytic nevi, unspecified: Secondary | ICD-10-CM | POA: Diagnosis not present

## 2023-05-07 DIAGNOSIS — Z411 Encounter for cosmetic surgery: Secondary | ICD-10-CM | POA: Diagnosis not present

## 2023-05-07 MED ORDER — TRETINOIN 0.05 % EX CREA
1.0000 | TOPICAL_CREAM | Freq: Every evening | CUTANEOUS | 3 refills | Status: AC
Start: 2023-05-07 — End: ?
  Filled 2023-05-07: qty 45, 90d supply, fill #0
  Filled 2023-05-07: qty 60, 90d supply, fill #0

## 2023-05-08 ENCOUNTER — Other Ambulatory Visit (HOSPITAL_COMMUNITY): Payer: Self-pay

## 2023-05-13 ENCOUNTER — Other Ambulatory Visit: Payer: Self-pay | Admitting: Obstetrics and Gynecology

## 2023-05-13 DIAGNOSIS — N6489 Other specified disorders of breast: Secondary | ICD-10-CM

## 2023-05-13 DIAGNOSIS — R921 Mammographic calcification found on diagnostic imaging of breast: Secondary | ICD-10-CM

## 2023-05-13 NOTE — Progress Notes (Unsigned)
 [name] [address] [place] [address] Phone: [phone] Subjective:   Martha [name], am serving as a scribe for Dr. Antoine Primas.  I'm seeing this patient by the request  of:  Patient, No Pcp Per  CC: Back and neck pain follow-up  [id number]  Martha [name] is a 43 y.o. female coming in with complaint of back and neck pain. OMT on 04/10/2023. Patient states that that upper back is painful on R side more than usual. Doing ok today.   Medications patient has been prescribed:   Taking:       Reviewed prior external information including notes and imaging from previsou exam, outside providers and external EMR if available.   As well as notes that were available from care everywhere and other healthcare systems.  Past medical history, social, surgical and family history all reviewed in electronic medical record.  No pertanent information unless stated regarding to the chief complaint.   Past Medical History:  Diagnosis Date   Asthma    Lichen planus    MS (multiple sclerosis) (HCC)    PCOS (polycystic ovarian syndrome)    Raynaud's disease     No Known Allergies   Review of Systems:  No headache, visual changes, nausea, vomiting, diarrhea, constipation, dizziness, abdominal pain, skin rash, fevers, chills, night sweats, weight loss, swollen lymph nodes, body aches, joint swelling, chest pain, shortness of breath, mood changes. POSITIVE muscle aches  Objective  Blood pressure 122/84, pulse 87, height 5\' 5"  (1.651 m), weight 132 lb (59.9 kg), SpO2 95%.   General: No apparent distress alert and oriented x3 mood and affect normal, dressed appropriately.  HEENT: Pupils equal, extraocular movements intact  Respiratory: Patient's speak in full sentences and does not appear short of breath  Cardiovascular: No lower extremity edema, non tender, no erythema  MSK:  Back significant tightness noted in the parascapular area.  Right  side does have multiple ribs that are displaced noted today.  Patient's neck exam does have some loss lordosis noted as well.  Osteopathic findings  C2 flexed rotated and side bent right C6 flexed rotated and side bent left T3 extended rotated and side bent right inhaled rib T9 extended rotated and side bent left L2 flexed rotated and side bent right L3 flexed rotated and side bent left Sacrum right on right       Assessment and Plan:  Thoracic back pain Multifactorial.  No significant weakness to go but continues to have the subluxation.  Has some hypermobility as well as some vascular aspects.  Discussed with patient about the underlying multiple sclerosis as well we will continue to monitor.  Do not feel laboratory workup is needed right now but if worsening pain would consider it.  Consider a burst of prednisone as well if needed.  Discussed icing regimen and home exercises.  Follow-up again in 6 to 8 weeks    Nonallopathic problems  Decision today to treat with OMT was based on Physical Exam  After verbal consent patient was treated with HVLA, ME, FPR techniques in cervical, rib, thoracic, lumbar, and sacral  areas  Patient tolerated the procedure well with improvement in symptoms  Patient given exercises, stretches and lifestyle modifications  See medications in patient instructions if given  Patient will follow up in 4-8 weeks     The above documentation has been reviewed and is accurate and complete Judi Saa, DO         Note:  This dictation was prepared with Dragon dictation along with smaller phrase technology. Any transcriptional errors that result from this process are unintentional.

## 2023-05-14 ENCOUNTER — Encounter: Payer: Self-pay | Admitting: Family Medicine

## 2023-05-14 ENCOUNTER — Ambulatory Visit: Payer: 59 | Admitting: Family Medicine

## 2023-05-14 ENCOUNTER — Encounter: Payer: Self-pay | Admitting: Obstetrics and Gynecology

## 2023-05-14 VITALS — BP 122/84 | HR 87 | Ht 65.0 in | Wt 132.0 lb

## 2023-05-14 DIAGNOSIS — M9902 Segmental and somatic dysfunction of thoracic region: Secondary | ICD-10-CM

## 2023-05-14 DIAGNOSIS — M9903 Segmental and somatic dysfunction of lumbar region: Secondary | ICD-10-CM

## 2023-05-14 DIAGNOSIS — M9901 Segmental and somatic dysfunction of cervical region: Secondary | ICD-10-CM | POA: Diagnosis not present

## 2023-05-14 DIAGNOSIS — M9908 Segmental and somatic dysfunction of rib cage: Secondary | ICD-10-CM

## 2023-05-14 DIAGNOSIS — M546 Pain in thoracic spine: Secondary | ICD-10-CM

## 2023-05-14 DIAGNOSIS — M9904 Segmental and somatic dysfunction of sacral region: Secondary | ICD-10-CM

## 2023-05-14 DIAGNOSIS — G8929 Other chronic pain: Secondary | ICD-10-CM

## 2023-05-14 NOTE — Assessment & Plan Note (Signed)
 Multifactorial.  No significant weakness to go but continues to have the subluxation.  Has some hypermobility as well as some vascular aspects.  Discussed with patient about the underlying multiple sclerosis as well we will continue to monitor.  Do not feel laboratory workup is needed right now but if worsening pain would consider it.  Consider a burst of prednisone as well if needed.  Discussed icing regimen and home exercises.  Follow-up again in 6 to 8 weeks

## 2023-05-23 ENCOUNTER — Ambulatory Visit: Payer: 59 | Admitting: "Endocrinology

## 2023-06-11 ENCOUNTER — Encounter: Payer: Self-pay | Admitting: Podiatry

## 2023-06-11 ENCOUNTER — Ambulatory Visit (INDEPENDENT_AMBULATORY_CARE_PROVIDER_SITE_OTHER): Admitting: Podiatry

## 2023-06-11 ENCOUNTER — Other Ambulatory Visit (HOSPITAL_COMMUNITY): Payer: Self-pay

## 2023-06-11 VITALS — Ht 65.0 in | Wt 132.0 lb

## 2023-06-11 DIAGNOSIS — B351 Tinea unguium: Secondary | ICD-10-CM

## 2023-06-11 DIAGNOSIS — L603 Nail dystrophy: Secondary | ICD-10-CM

## 2023-06-11 MED ORDER — TERBINAFINE HCL 250 MG PO TABS
250.0000 mg | ORAL_TABLET | Freq: Every day | ORAL | 0 refills | Status: AC
  Filled 2023-06-11: qty 90, 90d supply, fill #0

## 2023-06-11 NOTE — Progress Notes (Signed)
  Subjective:  Patient ID: Martha [name], female    DOB: [birth year],  MRN: [id number]  Chief Complaint  Patient presents with   Nail Problem    F/U for Nail dystrophy, Raynaud's disease without gangrene  Patient states that she feels there has been no change thus far    43 y.o. female presents with the above complaint. History confirmed with patient.  Does not note much improvement  Objective:  Physical Exam: Toes are cool to touch, no trophic changes or ulcerative lesions, normal DP and PT pulses, normal sensory exam, and areas of onycholysis and transverse Beau lines present both nails some longitudinal streaking and flaking of nails, right hallux some discoloration present   Culture positive for onychomycosis         Assessment:   1. Onychomycosis      Plan:  Patient was evaluated and treated and all questions answered.  Still does have some discoloration.  I do think continuing Lamisil would be beneficial and an additional round of this was sent to pharmacy.  We also discussed laser treatment.  She will be scheduled for this as well for dual therapy.  New photographs taken today.  Return in 4 months to reevaluate and compare.  Also discussed that some of his dystrophy likely will not completely resolve due to her history of lichen planus  Return in about 4 months (around 10/11/2023) for follow up after nail fungus treatment.

## 2023-06-19 DIAGNOSIS — E669 Obesity, unspecified: Secondary | ICD-10-CM | POA: Diagnosis not present

## 2023-06-20 ENCOUNTER — Ambulatory Visit: Admitting: Podiatry

## 2023-06-20 ENCOUNTER — Ambulatory Visit
Admission: RE | Admit: 2023-06-20 | Discharge: 2023-06-20 | Disposition: A | Discharge status: Home / Self Care | Source: Ambulatory Visit | Attending: Obstetrics and Gynecology | Admitting: Obstetrics and Gynecology

## 2023-06-20 ENCOUNTER — Other Ambulatory Visit: Payer: Self-pay | Admitting: Obstetrics and Gynecology

## 2023-06-20 DIAGNOSIS — R921 Mammographic calcification found on diagnostic imaging of breast: Secondary | ICD-10-CM | POA: Diagnosis not present

## 2023-06-20 DIAGNOSIS — N6489 Other specified disorders of breast: Secondary | ICD-10-CM

## 2023-06-24 ENCOUNTER — Ambulatory Visit (INDEPENDENT_AMBULATORY_CARE_PROVIDER_SITE_OTHER): Payer: 59 | Admitting: Neurology

## 2023-06-24 ENCOUNTER — Other Ambulatory Visit: Payer: Self-pay

## 2023-06-24 ENCOUNTER — Encounter: Payer: Self-pay | Admitting: Neurology

## 2023-06-24 ENCOUNTER — Other Ambulatory Visit (HOSPITAL_COMMUNITY): Payer: Self-pay

## 2023-06-24 VITALS — BP 96/65 | HR 77 | Ht 65.0 in | Wt 131.5 lb

## 2023-06-24 DIAGNOSIS — G35 Multiple sclerosis: Secondary | ICD-10-CM

## 2023-06-24 DIAGNOSIS — G43709 Chronic migraine without aura, not intractable, without status migrainosus: Secondary | ICD-10-CM | POA: Diagnosis not present

## 2023-06-24 DIAGNOSIS — R208 Other disturbances of skin sensation: Secondary | ICD-10-CM | POA: Diagnosis not present

## 2023-06-24 DIAGNOSIS — Z79899 Other long term (current) drug therapy: Secondary | ICD-10-CM

## 2023-06-24 DIAGNOSIS — E559 Vitamin D deficiency, unspecified: Secondary | ICD-10-CM

## 2023-06-24 MED ORDER — ZOLPIDEM TARTRATE 10 MG PO TABS
5.0000 mg | ORAL_TABLET | Freq: Every day | ORAL | 5 refills | Status: DC
Start: 2023-06-24 — End: 2024-01-29
  Filled 2023-06-24: qty 30, 30d supply, fill #0
  Filled 2023-09-19: qty 30, 30d supply, fill #1
  Filled 2023-10-25: qty 30, 30d supply, fill #2

## 2023-06-24 MED ORDER — NURTEC 75 MG PO TBDP
75.0000 mg | ORAL_TABLET | Freq: Every day | ORAL | 3 refills | Status: DC | PRN
Start: 2023-06-24 — End: 2024-01-29
  Filled 2023-06-24: qty 8, 30d supply, fill #0
  Filled 2023-07-09: qty 16, 16d supply, fill #0
  Filled 2023-10-25: qty 16, 16d supply, fill #1

## 2023-06-24 NOTE — Progress Notes (Unsigned)
 [facility] [address] [place] [address] Phone: [phone] Subjective:   Martha [name], am serving as a scribe for Dr. Ronnell Coins.  I'm seeing this patient by the request  of:  Patient, No Pcp Per  CC: Low back pain acute  [id number]  Martha [name] is a 43 y.o. female coming in with complaint of back and neck pain. OMT 05/14/2023. Patient states that she had a flare of lumbar spine pain on Friday. Pain in middle of spine. No injury. Pain is improving but still not able to move as she normally would. Denies any radiating pain.   Medications patient has been prescribed: None  Taking:    Saw neurology recently.  Was getting a repeat MRI of the brain.     Reviewed prior external information including notes and imaging from previsou exam, outside providers and external EMR if available.   As well as notes that were available from care everywhere and other healthcare systems.  Past medical history, social, surgical and family history all reviewed in electronic medical record.  No pertanent information unless stated regarding to the chief complaint.   Past Medical History:  Diagnosis Date   Asthma    Lichen planus    MS (multiple sclerosis) (HCC)    PCOS (polycystic ovarian syndrome)    Raynaud's disease     No Known Allergies   Review of Systems:  No headache, visual changes, nausea, vomiting, diarrhea, constipation, dizziness, abdominal pain, skin rash, fevers, chills, night sweats, weight loss, swollen lymph nodes, body aches, joint swelling, chest pain, shortness of breath, mood changes. POSITIVE muscle aches  Objective  Blood pressure 102/68, height 5\' 5"  (1.651 m), weight 130 lb (59 kg).   General: No apparent distress alert and oriented x3 mood and affect normal, dressed appropriately.  HEENT: Pupils equal, extraocular movements intact  Respiratory: Patient's speak in full sentences and does not appear short of  breath  Cardiovascular: No lower extremity edema, non tender, no erythema  Significant loss of lordosis of the lumbar spine.  Tightness noted only in the paraspinal musculature.  Negative Faber, negative straight leg test.  Neurovascular intact distally.  Osteopathic findings  C2 flexed rotated and side bent right C6 flexed rotated and side bent left T3 extended rotated and side bent right inhaled rib T9 extended rotated and side bent left L2 flexed rotated and side bent right L3 flexed rotated and side bent left L4 flexed rotated and side bent right  Sacrum right on right       Assessment and Plan:  Thoracic back pain Usually thoracic back pain but seems to be more lumbar.  Has been more spasmatic.  Icing regimen of home exercises, discussed with patient about Toradol  injection which I think will be helpful.  Increase activity slowly.  Discussed icing regimen.  Refilled patient's muscle relaxer to help her with nighttime pain.  Worsening pain would need to consider advanced imaging but do not think that this will be necessary with no true radicular symptoms.  Follow-up with me again in in 4 weeks.    Nonallopathic problems  Decision today to treat with OMT was based on Physical Exam  After verbal consent patient was treated with HVLA, ME, FPR techniques in cervical, rib, thoracic, lumbar, and sacral  areas  Patient tolerated the procedure well with improvement in symptoms  Patient given exercises, stretches and lifestyle modifications  See medications in patient instructions if given  Patient will follow up  in 4-8 weeks    The above documentation has been reviewed and is accurate and complete Martha Margo, DO          Note: This dictation was prepared with Dragon dictation along with smaller phrase technology. Any transcriptional errors that result from this process are unintentional.

## 2023-06-24 NOTE — Progress Notes (Signed)
 [facility]   PATIENT: Martha [name] DOB: [birth year]  REFERRING DOCTOR OR PCP:  Margy Shin [facility]) SOURCE: Patient, notes from neurology, laboratory reports, imaging reports, MRI images personally reviewed.  _________________________________   HISTORICAL  CHIEF COMPLAINT:  Chief Complaint  Patient presents with   Follow-up    Pt in room 10 alone Here for MS follow up. On Vumerity . Pt reports doing well, last eye exam was Dr.Gout last year. No concerns.     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 woman with multiple sclerosis.  Update 06/24/2023:  She is on Vumerity  and tolerates it well. She flushes if takes without food.   No GI issues.   She denies new symptoms or exacerbation.   10/24 MRI stable compared to 2023.   Gait and balance are good on a flat surface.  She had one fall in 2022 on stairs so she usually holds the bannister.  She has mild sensation like water running down calves or vibrating but these are not too bad.   Vision is better than 20/20.   ON was her first exacerbatin in 2009 (OD ON)   Color vision is fine.   She sees Dr. Candi Chafe q 2 years.    She reports short spells of vertigo just lasting seconds, often in trains.  Other days have no episodes.  They often in nay position -- having fewer spells than past year.   She has these for a few days  She is noting more headaches - frontal and around eye.  She often wakes up with them but they improve as they wake up.    They occur 2-6 days a week (5-10 a month for 4 or more hours). Often with migrainous features (Nausea, phonophobia) and she takes a Maxalt .  Maxalt  makes her sleepy so can't take at work (will take Excedrin with little benefit).       Some are triggered by barometric change.  Nurtec helped (had samples never a script).  She has not tried an injectable.   anti-CGRP agent.   She tried Topamax in the past with no benefit.    Imipramine  caused dry mouth so  she stopped  Her sleep quality is often poor but she does not snore.  She has both sleep onset and maintenance issues.   Ambien  6.25 mg helps her fall asleep but she wakes up 4 hours later. She takes just a couple nights/week   Doxepin  sometimes helps her stay asleep and other times not.   Trazodone caused nasal congestant and she stopped.    She changed jobs and now is working days only.     Fatigue is doing ok.  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   Vision is fine.  Bladder is fine.   She had ON near the time of diagnosis in 2009.  She saw Dr. Candi Chafe recently and her exam was fine.       She has lost 70 pounds on purpose, helped by semaglutide  and diet.       She has Raynaud's, left > right hand.  She wears heated socks which helps feet.   She has had this for many years but the episodes are more painful recently.  She generally does worse during winter than summer.   MS History She was diagnosed with  MS in 2009 after presenting with right optic neuritis.  She initially saw ophthalmology and was referred to neuro-ophthalmology (Dr. Durand Gift).  After an MRI showed changes consistent with MS, she was referred to Dr. Shoshana Dowse, at [facility] neurology.     She was placed on Avonex and tolerated it well.   Due to LFT elevation, she switched to Copaxone.    She had tolerability issues and switched to Tecfidera in 2013.  She had some mild liver elevation.  Her MS was stable on all of these medications.  She stopped Tecfidera in 2017 due to family planning issues.    She has not been able to get pregnant and is ready to restart therapy.  She started Vumerity  August 2021.  She has low vitamin D  and takes supplements weekly.       Imaging:  MRI of the brain 12/05/2018 showed multiple T2/FLAIR hyperintense foci, predominantly in the periventricular white matter radially oriented to the ventricles.   A couple foci were also noted in the juxtacortical white matter, possibly a couple punctate foci in the pons, deep white matter and one focus in the right cerebellar hemisphere.  None of the foci enhanced.  There were no films for comparison.    She reports a cervical spine MRI in 2013 did not show any plaque.      MRI lumbar in 2015 showed mild facet hypertrophy at a few levels.     MRI brain 11/03/2019 showed multiple T2/FLAIR hyperintense foci in the periventricular, juxtacortical and deep white matter of both hemispheres consistent with demyelinating plaque associated with multiple sclerosis. Most of the foci were present on the 12/05/2018 MRI. However, there is one new juxtacortical focus in the right hemisphere. That focus also enhances after contrast implying a more acute or subacute timeframe.  MRI cervical spine 11/03/2019 showed a normal spinal cord and no significant DJD.  MRI brain 01/03/2023 showed no new lesions compared to April, 2023  OTHER: She had Lichen Planus on the left arm and has a sore in her mouth potentially the same diagnosis.  She is going to see an oral surgeon for possible biopsy.   REVIEW OF SYSTEMS: Constitutional: No fevers, chills, sweats, or change in appetite Eyes: No visual changes, double vision, eye pain Ear, nose and throat: No hearing loss, ear pain, nasal congestion, sore throat.  She currently has a sore in her mouth potentially worrisome for lichen planus Cardiovascular: No chest pain, palpitations Respiratory:  No shortness of breath at rest or with exertion.   No wheezes GastrointestinaI: No nausea, vomiting, diarrhea, abdominal pain, fecal incontinence Genitourinary:  No dysuria, urinary retention or frequency.  No nocturia. Musculoskeletal:  No neck pain.  She has some back pain. Integumentary: No rash, pruritus.  She had lichen planus in the left arm Neurological: as above Psychiatric: No depression at this time.  No anxiety Endocrine: No  palpitations, diaphoresis, change in appetite, change in weigh or increased thirst Hematologic/Lymphatic:  No anemia, purpura, petechiae. Allergic/Immunologic: No itchy/runny eyes, nasal congestion, recent allergic reactions, rashes  ALLERGIES: No Known Allergies  HOME MEDICATIONS:  Current Outpatient Medications:    amLODipine  (NORVASC ) 2.5 MG tablet, Take 1 tablet (2.5 mg total) by mouth daily., Disp: 90 tablet, Rfl: 0   imipramine  (TOFRANIL ) 25 MG tablet, Take 1-2 tablets (25-50 mg total) by mouth at bedtime., Disp: 60 tablet, Rfl: 11   nitroGLYCERIN  (NITROGLYN) 2 % OINT ointment, Apply 1 Application topically every 12 (twelve) hours. To affected toes, Disp: 60 g,  Rfl: 2   ondansetron  (ZOFRAN -ODT) 4 MG disintegrating tablet, Dissolve 2 tablets (8 mg total) in the mouth 2 (two) times daily., Disp: 60 tablet, Rfl: 1   Rimegepant Sulfate  (NURTEC) 75 MG TBDP, Take 1 tablet (75 mg total) by mouth daily as needed for migraine., Disp: 30 tablet, Rfl: 3   terbinafine  (LAMISIL ) 250 MG tablet, Take 1 tablet (250 mg total) by mouth daily., Disp: 90 tablet, Rfl: 0   tretinoin  (RETIN-A ) 0.05 % cream, Apply 1 Application topically to the face every evening., Disp: 60 g, Rfl: 3   VUMERITY  231 MG CPDR, Take 462 mg by mouth in the morning and at bedtime., Disp: 360 capsule, Rfl: 3   zolpidem  (AMBIEN ) 10 MG tablet, Take 1/2-1 tablet (5-10 mg total) by mouth at bedtime., Disp: 30 tablet, Rfl: 5  PAST MEDICAL HISTORY: Past Medical History:  Diagnosis Date   Asthma    Lichen planus    MS (multiple sclerosis) (HCC)    PCOS (polycystic ovarian syndrome)    Raynaud's disease     PAST SURGICAL HISTORY: Past Surgical History:  Procedure Laterality Date   BREAST BIOPSY Left 12/11/2022   MM LT BREAST BX W LOC DEV 1ST LESION IMAGE BX SPEC STEREO GUIDE 12/11/2022 [facility]   BREAST BIOPSY Left 12/11/2022   MM LT BREAST BX W LOC DEV EA AD LESION IMG BX SPEC STEREO GUIDE 12/11/2022 [facility]    colonoscopy     DILATION AND CURETTAGE OF UTERUS     x 3   ESOPHAGOGASTRODUODENOSCOPY     TONSILLECTOMY AND ADENOIDECTOMY      FAMILY HISTORY: Family History  Problem Relation Age of Onset   Diabetes Mother    Thyroid  disease Mother    Hypertension Mother    Hypercholesterolemia Mother    Migraines Mother    Depression Mother    Diabetes Father    Thyroid  disease Father    CAD Father    Hypercholesterolemia Father    Hypertension Father    Depression Father    Pernicious anemia Father    Breast cancer Paternal Aunt     SOCIAL HISTORY:  Social History   Socioeconomic History   Marital status: Married    Spouse name: Not on file   Number of children: 0   Years of education: college   Highest education level: Professional school degree (e.g., MD, DDS, DVM, JD)  Occupational History   Occupation: physician  Tobacco Use   Smoking status: Never   Smokeless tobacco: Never  Vaping Use   Vaping status: Never Used  Substance and Sexual Activity   Alcohol use: No   Drug use: No   Sexual activity: Not on file  Other Topics Concern   Not on file  Social History Narrative   Right handed   1-2 cups caffeine daily   Lives at home with husband   Social Drivers of Health   Financial Resource Strain: Not on file  Food Insecurity: Not on file  Transportation Needs: Not on file  Physical Activity: Not on file  Stress: Stress Concern Present (04/01/2023)   Received from [facility]   [employer] of Occupational Health - Occupational Stress Questionnaire    Feeling of Stress : Very much  Social Connections: Not on file  Intimate Partner Violence: Not on file     PHYSICAL EXAM  Vitals:   06/24/23 0823  BP: 96/65  Pulse: 77  Weight: 131 lb 8 oz (59.6 kg)  Height: 5'  5" (1.651 m)    Body mass index is 21.88 kg/m.   General: The patient is well-developed and well-nourished and in no acute distress  HEENT:  Head is [place]/AT.  Sclera are  anicteric.    Skin: Extremities are without rash or  edema.  Neurologic Exam  Mental status: The patient is alert and oriented x 3 at the time of the examination. The patient has apparent normal recent and remote memory, with an apparently normal attention span and concentration ability.   Speech is normal.  Cranial nerves: Extraocular movements are full.     Facial strength is symmetric.  No obvious hearing deficits are noted.  Motor:  Muscle bulk is normal.   Tone is normal. Strength is  5 / 5 in all 4 extremities.   Sensory: She had normal sensation to touch and vibration in the arms and legs.    Coordination: Cerebellar testing reveals good finger-nose-finger and heel-to-shin bilaterally.  Gait and station: Station is normal.   Gait is normal. Tandem is minimally wide.    Romberg is normal.  Reflexes: Deep tendon reflexes are symmetric and normal bilaterally.        DIAGNOSTIC DATA (LABS, IMAGING, TESTING) - I reviewed patient records, labs, notes, testing and imaging myself where available.  Lab Results  Component Value Date   WBC 4.3 12/10/2022   HGB 12.8 12/10/2022   HCT 42.2 12/10/2022   MCV 79 12/10/2022   PLT 377 12/10/2022      Component Value Date/Time   NA 141 05/28/2022 0903   K 4.1 05/28/2022 0903   CL 103 05/28/2022 0903   CO2 20 05/28/2022 0903   GLUCOSE 73 05/28/2022 0903   GLUCOSE 125 (H) 10/12/2019 0005   BUN 14 05/28/2022 0903   CREATININE 0.61 05/28/2022 0903   CALCIUM 9.2 05/28/2022 0903   PROT 6.9 05/28/2022 0903   ALBUMIN 4.4 05/28/2022 0903   AST 14 05/28/2022 0903   ALT 20 05/28/2022 0903   ALKPHOS 47 05/28/2022 0903   BILITOT 0.5 05/28/2022 0903   GFRNONAA >60 10/12/2019 0005   GFRAA >60 10/12/2019 0005       ASSESSMENT AND PLAN  Multiple sclerosis (HCC) - Plan: CBC with Differential/Platelet, Comprehensive metabolic panel with GFR, Lipid panel, TSH  High risk medication use - Plan: CBC with Differential/Platelet, Comprehensive  metabolic panel with GFR, Lipid panel, TSH  Vitamin D  deficiency - Plan: VITAMIN D  25 Hydroxy (Vit-D Deficiency, Fractures)  Dysesthesia  Chronic migraine w/o aura, not intractable, w/o stat migr   1.  Continue Vumerity .  Check labs (once had lymphocytes of 0.6 and once 0.7 but last was 1.3 in March 2024).  We will check an MRI of the brain to determine if there is any breakthrough activity and consider a different disease modifying therapy if this is occurring. 2.   for episodic migraines   Continue prn Maxalt .   Add Nurtec  (samples [id number] P  09/2025) 3.   continue Ambien .  4.   Return to see me in 6 months or sooner for new or worsening neurologic symptoms.   Valentine Kuechle A. Godwin Lat, MD, [facility] 06/24/2023, 9:12 AM Certified in Neurology, Clinical Neurophysiology, Sleep Medicine and Neuroimaging  [facility] [address] [place], [place] [address] [phone]

## 2023-06-25 ENCOUNTER — Encounter: Payer: Self-pay | Admitting: Neurology

## 2023-06-25 LAB — CBC WITH DIFFERENTIAL/PLATELET
Basophils Absolute: 0.1 10*3/uL (ref 0.0–0.2)
Basos: 1 %
EOS (ABSOLUTE): 0.1 10*3/uL (ref 0.0–0.4)
Eos: 1 %
Hematocrit: 41 % (ref 34.0–46.6)
Hemoglobin: 12 g/dL (ref 11.1–15.9)
Immature Grans (Abs): 0 10*3/uL (ref 0.0–0.1)
Immature Granulocytes: 1 %
Lymphocytes Absolute: 1 10*3/uL (ref 0.7–3.1)
Lymphs: 23 %
MCH: 23.3 pg — ABNORMAL LOW (ref 26.6–33.0)
MCHC: 29.3 g/dL — ABNORMAL LOW (ref 31.5–35.7)
MCV: 80 fL (ref 79–97)
Monocytes Absolute: 0.4 10*3/uL (ref 0.1–0.9)
Monocytes: 10 %
Neutrophils Absolute: 2.7 10*3/uL (ref 1.4–7.0)
Neutrophils: 64 %
Platelets: 370 10*3/uL (ref 150–450)
RBC: 5.14 x10E6/uL (ref 3.77–5.28)
RDW: 13.4 % (ref 11.7–15.4)
WBC: 4.3 10*3/uL (ref 3.4–10.8)

## 2023-06-25 LAB — COMPREHENSIVE METABOLIC PANEL WITH GFR
ALT: 13 IU/L (ref 0–32)
AST: 10 IU/L (ref 0–40)
Albumin: 4.4 g/dL (ref 3.9–4.9)
Alkaline Phosphatase: 44 IU/L (ref 44–121)
BUN/Creatinine Ratio: 15 (ref 9–23)
BUN: 11 mg/dL (ref 6–24)
Bilirubin Total: 0.4 mg/dL (ref 0.0–1.2)
CO2: 25 mmol/L (ref 20–29)
Calcium: 9.2 mg/dL (ref 8.7–10.2)
Chloride: 105 mmol/L (ref 96–106)
Creatinine, Ser: 0.71 mg/dL (ref 0.57–1.00)
Globulin, Total: 2.1 g/dL (ref 1.5–4.5)
Glucose: 70 mg/dL (ref 70–99)
Potassium: 5.1 mmol/L (ref 3.5–5.2)
Sodium: 142 mmol/L (ref 134–144)
Total Protein: 6.5 g/dL (ref 6.0–8.5)
eGFR: 108 mL/min/{1.73_m2} (ref >59)

## 2023-06-25 LAB — LIPID PANEL
Chol/HDL Ratio: 2.5 ratio (ref 0.0–4.4)
Cholesterol, Total: 167 mg/dL (ref 100–199)
HDL: 66 mg/dL (ref >39)
LDL Chol Calc (NIH): 92 mg/dL (ref 0–99)
Triglycerides: 44 mg/dL (ref 0–149)
VLDL Cholesterol Cal: 9 mg/dL (ref 5–40)

## 2023-06-25 LAB — TSH: TSH: 2.16 u[IU]/mL (ref 0.450–4.500)

## 2023-06-25 LAB — VITAMIN D 25 HYDROXY (VIT D DEFICIENCY, FRACTURES): Vit D, 25-Hydroxy: 60.8 ng/mL (ref 30.0–100.0)

## 2023-06-26 ENCOUNTER — Encounter: Payer: Self-pay | Admitting: Family Medicine

## 2023-06-26 ENCOUNTER — Ambulatory Visit: Admitting: Family Medicine

## 2023-06-26 VITALS — BP 102/68 | Ht 65.0 in | Wt 130.0 lb

## 2023-06-26 DIAGNOSIS — G8929 Other chronic pain: Secondary | ICD-10-CM | POA: Diagnosis not present

## 2023-06-26 DIAGNOSIS — M9908 Segmental and somatic dysfunction of rib cage: Secondary | ICD-10-CM | POA: Diagnosis not present

## 2023-06-26 DIAGNOSIS — M9904 Segmental and somatic dysfunction of sacral region: Secondary | ICD-10-CM

## 2023-06-26 DIAGNOSIS — M9901 Segmental and somatic dysfunction of cervical region: Secondary | ICD-10-CM | POA: Diagnosis not present

## 2023-06-26 DIAGNOSIS — M9902 Segmental and somatic dysfunction of thoracic region: Secondary | ICD-10-CM

## 2023-06-26 DIAGNOSIS — M546 Pain in thoracic spine: Secondary | ICD-10-CM | POA: Diagnosis not present

## 2023-06-26 DIAGNOSIS — M9903 Segmental and somatic dysfunction of lumbar region: Secondary | ICD-10-CM

## 2023-06-26 MED ORDER — KETOROLAC TROMETHAMINE 60 MG/2ML IM SOLN
60.0000 mg | Freq: Once | INTRAMUSCULAR | Status: AC
Start: 2023-06-26 — End: 2023-06-26
  Administered 2023-06-26: 60 mg via INTRAMUSCULAR

## 2023-06-26 NOTE — Assessment & Plan Note (Signed)
 Usually thoracic back pain but seems to be more lumbar.  Has been more spasmatic.  Icing regimen of home exercises, discussed with patient about Toradol  injection which I think will be helpful.  Increase activity slowly.  Discussed icing regimen.  Refilled patient's muscle relaxer to help her with nighttime pain.  Worsening pain would need to consider advanced imaging but do not think that this will be necessary with no true radicular symptoms.  Follow-up with me again in in 4 weeks.

## 2023-06-26 NOTE — Patient Instructions (Signed)
 See me in 4 weeks ok to double book

## 2023-06-27 ENCOUNTER — Other Ambulatory Visit (HOSPITAL_COMMUNITY): Payer: Self-pay

## 2023-06-27 ENCOUNTER — Other Ambulatory Visit: Payer: Self-pay

## 2023-06-27 ENCOUNTER — Encounter: Payer: Self-pay | Admitting: Family Medicine

## 2023-06-27 MED ORDER — METHOCARBAMOL 500 MG PO TABS
500.0000 mg | ORAL_TABLET | Freq: Three times a day (TID) | ORAL | 0 refills | Status: AC
Start: 2023-06-27 — End: ?
  Filled 2023-06-27: qty 90, 30d supply, fill #0

## 2023-06-28 ENCOUNTER — Telehealth: Payer: Self-pay

## 2023-06-28 ENCOUNTER — Other Ambulatory Visit (HOSPITAL_COMMUNITY): Payer: Self-pay

## 2023-06-28 NOTE — Telephone Encounter (Signed)
 Pharmacy Patient Advocate Encounter   Received notification from CoverMyMeds that prior authorization for Nurtec 75MG  dispersible tablets is required/requested.   Insurance verification completed.   The patient is insured through [facility] .   Per test claim: PA required; PA submitted to above mentioned insurance via CoverMyMeds Key/confirmation #/EOC [id number] Status is pending

## 2023-07-02 ENCOUNTER — Other Ambulatory Visit (HOSPITAL_COMMUNITY): Payer: Self-pay

## 2023-07-02 ENCOUNTER — Telehealth: Payer: Self-pay

## 2023-07-02 NOTE — Telephone Encounter (Signed)
 Pharmacy Patient Advocate Encounter  Received notification from [facility] that Prior Authorization for Nurtec 75MG  dispersible tablets has been APPROVED from 07/02/2023 to 12/29/2023. Ran test claim, Copay is $19.97. This test claim was processed through [facility]- copay amounts may vary at other pharmacies due to pharmacy/plan contracts, or as the patient moves through the different stages of their insurance plan.   PA #/Case ID/Reference #: PA Case ID #: [id number]

## 2023-07-02 NOTE — Telephone Encounter (Signed)
 Pharmacy Patient Advocate Encounter   Received notification from CoverMyMeds that prior authorization for VUMERITY  231MG  delayed-release capsules is required/requested.   Insurance verification completed.   The patient is insured through [facility] .   Per test claim: PA required; PA submitted to above mentioned insurance via CoverMyMeds Key/confirmation #/EOC [facility] Status is pending

## 2023-07-03 ENCOUNTER — Ambulatory Visit: Admitting: Family Medicine

## 2023-07-05 ENCOUNTER — Other Ambulatory Visit

## 2023-07-05 ENCOUNTER — Other Ambulatory Visit (HOSPITAL_COMMUNITY): Payer: Self-pay

## 2023-07-05 NOTE — Telephone Encounter (Signed)
 Pharmacy Patient Advocate Encounter  Received notification from [facility] that Prior Authorization for VUMERITY  231MG  delayed-release capsules has been APPROVED from 07/18/2023 to 07/16/2024   PA #/Case ID/Reference #: [id number]

## 2023-07-09 ENCOUNTER — Other Ambulatory Visit: Payer: Self-pay | Admitting: Neurology

## 2023-07-09 ENCOUNTER — Other Ambulatory Visit: Payer: Self-pay | Admitting: *Deleted

## 2023-07-09 ENCOUNTER — Other Ambulatory Visit (HOSPITAL_COMMUNITY): Payer: Self-pay

## 2023-07-09 DIAGNOSIS — G35D Multiple sclerosis, unspecified: Secondary | ICD-10-CM

## 2023-07-09 DIAGNOSIS — G35 Multiple sclerosis: Secondary | ICD-10-CM

## 2023-07-09 NOTE — Telephone Encounter (Signed)
 Last seen on 06/24/23 Follow up scheduled on 01/29/24

## 2023-07-17 ENCOUNTER — Ambulatory Visit (INDEPENDENT_AMBULATORY_CARE_PROVIDER_SITE_OTHER): Admitting: Podiatrist

## 2023-07-17 DIAGNOSIS — L603 Nail dystrophy: Secondary | ICD-10-CM

## 2023-07-17 DIAGNOSIS — B351 Tinea unguium: Secondary | ICD-10-CM

## 2023-07-17 NOTE — Progress Notes (Signed)
 Patient presents today for the laser treatment # 1. Diagnosed with mycotic nail infection by Dr. Michalene Agee.  Wer are starting  laser therapy to compliment oral lamisil  therapy   Toenail most affected are bilateral first through fourth on both feet.  There is horizontal ridging of the nails and some breakage.   All other systems are negative.   Laser therapy via [facility] Laser therapy system was admininstered to affected toenails both feet .  The Patient tolerated the treatment well. All safety precautions were in place.   Single laser pass was done on non-affected nails.   Follow up in 4 weeks for laser # 2  Pre laser photographs taken by Dr. Michalene Agee on 06/11/2023 office visit for comparison

## 2023-07-22 NOTE — Progress Notes (Signed)
  [name] [address] [place] [address] Phone: [phone] Subjective:    I'm seeing this patient by the request  of:  Patient, No Pcp Per  CC: back and neck pain follow up   GEX:[id number]  Martha [name] is a 43 y.o. female coming in with complaint of back and neck pain. OMT 06/26/2023. Patient states that her shoulder is bothering her over middle deltoid. Pulled covers over her body. Painful she pushes in that area.   Back pain is the same as last visit.   Medications patient has been prescribed: Robaxin   Taking: yes         Reviewed prior external information including notes and imaging from previsou exam, outside providers and external EMR if available.   As well as notes that were available from care everywhere and other healthcare systems.  Past medical history, social, surgical and family history all reviewed in electronic medical record.  No pertanent information unless stated regarding to the chief complaint.   Past Medical History:  Diagnosis Date   Asthma    Lichen planus    MS (multiple sclerosis) (HCC)    PCOS (polycystic ovarian syndrome)    Raynaud's disease     No Known Allergies   Review of Systems:  No headache, visual changes, nausea, vomiting, diarrhea, constipation, dizziness, abdominal pain, skin rash, fevers, chills, night sweats, weight loss, swollen lymph nodes, body aches, joint swelling, chest pain, shortness of breath, mood changes. POSITIVE muscle aches  Objective  Blood pressure 102/74, height 5\' 5"  (1.651 m), weight 127 lb (57.6 kg).   General: No apparent distress alert and oriented x3 mood and affect normal, dressed appropriately.  HEENT: Pupils equal, extraocular movements intact  Respiratory: Patient's speak in full sentences and does not appear short of breath  Cardiovascular: No lower extremity edema, non tender, no erythema  MSK:  Back does have some loss lordosis noted.  Some  tightness noted in the paraspinal musculature.  Patient does have more tightness noted in the neck than anywhere else today.  Seems to be also in the scapular region.  Osteopathic findings  C2 flexed rotated and side bent right C7 flexed rotated and side bent left T3 extended rotated and side bent right inhaled rib T5 extended rotated and side bent left L2 flexed rotated and side bent right Sacrum right on right     Assessment and Plan:  Thoracic back pain Discussed home exercises and icing regimen, discussed which activities to do and which ones to avoid.  Increase activity slowly.  Discussed icing regimen and home exercises.  Patient will be traveling.  Start working    Nonallopathic problems  Decision today to treat with OMT was based on Physical Exam  After verbal consent patient was treated with HVLA, ME, FPR techniques in cervical, rib, thoracic, lumbar, and sacral  areas  Patient tolerated the procedure well with improvement in symptoms  Patient given exercises, stretches and lifestyle modifications  See medications in patient instructions if given  Patient will follow up in 4-8 weeks    The above documentation has been reviewed and is accurate and complete Martha [name] M Martha Varnell, DO          Note: This dictation was prepared with Dragon dictation along with smaller phrase technology. Any transcriptional errors that result from this process are unintentional.

## 2023-07-24 ENCOUNTER — Encounter: Payer: Self-pay | Admitting: Family Medicine

## 2023-07-24 ENCOUNTER — Ambulatory Visit (INDEPENDENT_AMBULATORY_CARE_PROVIDER_SITE_OTHER): Admitting: Family Medicine

## 2023-07-24 VITALS — BP 102/74 | Ht 65.0 in | Wt 127.0 lb

## 2023-07-24 DIAGNOSIS — M546 Pain in thoracic spine: Secondary | ICD-10-CM

## 2023-07-24 DIAGNOSIS — M9901 Segmental and somatic dysfunction of cervical region: Secondary | ICD-10-CM | POA: Diagnosis not present

## 2023-07-24 DIAGNOSIS — M9903 Segmental and somatic dysfunction of lumbar region: Secondary | ICD-10-CM | POA: Diagnosis not present

## 2023-07-24 DIAGNOSIS — M9902 Segmental and somatic dysfunction of thoracic region: Secondary | ICD-10-CM | POA: Diagnosis not present

## 2023-07-24 DIAGNOSIS — G8929 Other chronic pain: Secondary | ICD-10-CM

## 2023-07-24 DIAGNOSIS — M9908 Segmental and somatic dysfunction of rib cage: Secondary | ICD-10-CM | POA: Diagnosis not present

## 2023-07-24 DIAGNOSIS — M9904 Segmental and somatic dysfunction of sacral region: Secondary | ICD-10-CM

## 2023-07-24 NOTE — Assessment & Plan Note (Addendum)
 Discussed home exercises and icing regimen, discussed which activities to do and which ones to avoid.  Increase activity slowly.  Discussed icing regimen and home exercises.  Patient will be traveling.  Start working on some posture ergonomics throughout the day if possible.  Continue to be active otherwise has Robaxin  and encouraged her to try 750 mg.Aaron Aas

## 2023-08-16 ENCOUNTER — Ambulatory Visit (INDEPENDENT_AMBULATORY_CARE_PROVIDER_SITE_OTHER): Admitting: *Deleted

## 2023-08-16 DIAGNOSIS — L603 Nail dystrophy: Secondary | ICD-10-CM

## 2023-08-16 NOTE — Progress Notes (Signed)
 Patient presents today for the laser treatment # 2. Diagnosed with mycotic nail infection by Dr. Michalene Agee.    We are starting  laser therapy to compliment oral lamisil  therapy   Toenail most affected are bilateral first through fourth on both feet.  There is horizontal ridging of the nails and some breakage.   All other systems are negative.  No filing done today. Laser therapy via [facility] Laser therapy system was admininstered to affected toenails both feet . The Patient tolerated the treatment well. All safety precautions were in place.    Follow up in 4 weeks for laser # 3  Pre laser photographs taken by Dr. Michalene Agee on 06/11/2023 office visit for comparison

## 2023-08-23 NOTE — Progress Notes (Signed)
 [name] [address] [place] [address] Phone: [phone] Subjective:    I'm seeing this patient by the request  of:  Patient, No Pcp Per  CC: Back and neck pain follow-up  YEP:Dlagzrupcz  Martha [name] is a 43 y.o. female coming in with complaint of back and neck pain. OMT on 07/24/2023. Patient states she is the same still having some discomfort, recently did have an upper respiratory infection.  Does not feel sick, no fevers, just has had recently had a cough though.          Reviewed prior external information including notes and imaging from previsou exam, outside providers and external EMR if available.   As well as notes that were available from care everywhere and other healthcare systems.  Past medical history, social, surgical and family history all reviewed in electronic medical record.  No pertanent information unless stated regarding to the chief complaint.   Past Medical History:  Diagnosis Date   Asthma    Lichen planus    MS (multiple sclerosis) (HCC)    PCOS (polycystic ovarian syndrome)    Raynaud's disease     No Known Allergies   Review of Systems:  No headache, visual changes, nausea, vomiting, diarrhea, constipation, dizziness, abdominal pain, skin rash, fevers, chills, night sweats, weight loss, swollen lymph nodes, body aches, joint swelling, chest pain, shortness of breath, mood changes. POSITIVE muscle aches  Objective  Blood pressure 108/78, height 5' 5 (1.651 m), weight 128 lb (58.1 kg).   General: No apparent distress alert and oriented x3 mood and affect normal, dressed appropriately.  HEENT: Pupils equal, extraocular movements intact  Respiratory: Patient's speak in full sentences and does not appear short of breath  Cardiovascular: No lower extremity edema, non tender, no erythema  Gait relatively normal MSK:  Back does have tightness noted in the paraspinal musculature.  More in the thoracic  area.  Tightness noted on the paraspinal musculature of the lumbar spine right greater than left  Osteopathic findings  C2 flexed rotated and side bent right C6 flexed rotated and side bent left T3 extended rotated and side bent right inhaled rib T9 extended rotated and side bent left L1 flexed rotated and side bent right L3 flexed rotated and side bent left Sacrum right on right       Assessment and Plan:  Thoracic back pain Tightness in the upper back does have some tightness noted in the paraspinal musculature.  Negative straight leg test for anything else.  Recently did have a cough and an upper respiratory infection and if worsening may need some antibiotics but I think patient is on the tail end of that.  Follow-up with me again in 6 to 8 weeks otherwise.    Nonallopathic problems  Decision today to treat with OMT was based on Physical Exam  After verbal consent patient was treated with HVLA, ME, FPR techniques in cervical, rib, thoracic, lumbar, and sacral  areas  Patient tolerated the procedure well with improvement in symptoms  Patient given exercises, stretches and lifestyle modifications  See medications in patient instructions if given  Patient will follow up in 4-8 weeks    The above documentation has been reviewed and is accurate and complete Martha Naves M Lirio Bach, DO          Note: This dictation was prepared with Dragon dictation along with smaller phrase technology. Any transcriptional errors that result from this process are unintentional.

## 2023-08-30 ENCOUNTER — Ambulatory Visit (INDEPENDENT_AMBULATORY_CARE_PROVIDER_SITE_OTHER): Admitting: Family Medicine

## 2023-08-30 ENCOUNTER — Encounter: Payer: Self-pay | Admitting: Family Medicine

## 2023-08-30 VITALS — BP 108/78 | Ht 65.0 in | Wt 128.0 lb

## 2023-08-30 DIAGNOSIS — M9901 Segmental and somatic dysfunction of cervical region: Secondary | ICD-10-CM | POA: Diagnosis not present

## 2023-08-30 DIAGNOSIS — M9903 Segmental and somatic dysfunction of lumbar region: Secondary | ICD-10-CM | POA: Diagnosis not present

## 2023-08-30 DIAGNOSIS — M9904 Segmental and somatic dysfunction of sacral region: Secondary | ICD-10-CM

## 2023-08-30 DIAGNOSIS — M9902 Segmental and somatic dysfunction of thoracic region: Secondary | ICD-10-CM

## 2023-08-30 DIAGNOSIS — M546 Pain in thoracic spine: Secondary | ICD-10-CM | POA: Diagnosis not present

## 2023-08-30 DIAGNOSIS — G8929 Other chronic pain: Secondary | ICD-10-CM | POA: Diagnosis not present

## 2023-08-30 DIAGNOSIS — M9908 Segmental and somatic dysfunction of rib cage: Secondary | ICD-10-CM

## 2023-08-30 NOTE — Assessment & Plan Note (Signed)
 Tightness in the upper back does have some tightness noted in the paraspinal musculature.  Negative straight leg test for anything else.  Recently did have a cough and an upper respiratory infection and if worsening may need some antibiotics but I think patient is on the tail end of that.  Follow-up with me again in 6 to 8 weeks otherwise.

## 2023-09-10 ENCOUNTER — Other Ambulatory Visit (HOSPITAL_COMMUNITY): Payer: Self-pay

## 2023-09-10 MED ORDER — AZITHROMYCIN 250 MG PO TABS
ORAL_TABLET | ORAL | 0 refills | Status: AC
  Filled 2023-09-10: qty 6, 5d supply, fill #0

## 2023-09-13 ENCOUNTER — Ambulatory Visit (INDEPENDENT_AMBULATORY_CARE_PROVIDER_SITE_OTHER): Admitting: *Deleted

## 2023-09-13 DIAGNOSIS — L603 Nail dystrophy: Secondary | ICD-10-CM

## 2023-09-13 NOTE — Progress Notes (Signed)
 Patient presents today for the 3rd laser treatment. Diagnosed with mycotic nail infection by Dr. Silva.    Toenail most affected are bilateral 1st through 4th on both feet. There is horizontal ridging of the nails and some breakage.  Patient is frustrated stating she thinks her nails aren't clearing due to an autoimmune disease. She has MS and Raynauds as well  All other systems are negative.  No filing done today. Laser therapy via [facility] was admininstered to 1-5 toenails both feet . The Patient tolerated the treatment well. All safety precautions were in place.   Patient is also taking terbinafine .   Follow up in 6 weeks for laser # 4  Pre laser photographs taken by Dr. Silva on 06/11/2023 office visit for comparison

## 2023-09-17 ENCOUNTER — Ambulatory Visit: Admitting: Family Medicine

## 2023-09-17 ENCOUNTER — Encounter: Payer: Self-pay | Admitting: Family Medicine

## 2023-09-17 VITALS — BP 108/64 | HR 77 | Ht 65.0 in | Wt 129.0 lb

## 2023-09-17 DIAGNOSIS — M9903 Segmental and somatic dysfunction of lumbar region: Secondary | ICD-10-CM

## 2023-09-17 DIAGNOSIS — M9908 Segmental and somatic dysfunction of rib cage: Secondary | ICD-10-CM

## 2023-09-17 DIAGNOSIS — M9901 Segmental and somatic dysfunction of cervical region: Secondary | ICD-10-CM

## 2023-09-17 DIAGNOSIS — M9904 Segmental and somatic dysfunction of sacral region: Secondary | ICD-10-CM

## 2023-09-17 DIAGNOSIS — M9902 Segmental and somatic dysfunction of thoracic region: Secondary | ICD-10-CM | POA: Diagnosis not present

## 2023-09-17 DIAGNOSIS — M546 Pain in thoracic spine: Secondary | ICD-10-CM | POA: Diagnosis not present

## 2023-09-17 DIAGNOSIS — G8929 Other chronic pain: Secondary | ICD-10-CM

## 2023-09-17 NOTE — Progress Notes (Signed)
  Martha [name] [address] [place] [address] Phone: [phone] Subjective:   ISusannah Gully, am serving as a scribe for Dr. Arthea Claudene.  I'm seeing this patient by the request  of:  Patient, No Pcp Per  CC: Upper back and neck pain  YEP:Dlagzrupcz  Martha [name] is a 43 y.o. female coming in with complaint of back and neck pain. OMT on 09/17/2023. Patient states same per usual. No new concerns.          Reviewed prior external information including notes and imaging from previsou exam, outside providers and external EMR if available.   As well as notes that were available from care everywhere and other healthcare systems.  Past medical history, social, surgical and family history all reviewed in electronic medical record.  No pertanent information unless stated regarding to the chief complaint.   Past Medical History:  Diagnosis Date   Asthma    Lichen planus    MS (multiple sclerosis) (HCC)    PCOS (polycystic ovarian syndrome)    Raynaud's disease     No Known Allergies   Review of Systems:  No headache, visual changes, nausea, vomiting, diarrhea, constipation, dizziness, abdominal pain, skin rash, fevers, chills, night sweats, weight loss, swollen lymph nodes, body aches, joint swelling, chest pain, shortness of breath, mood changes. POSITIVE muscle aches  Objective  Blood pressure 108/64, pulse 77, height 5' 5 (1.651 m), weight 129 lb (58.5 kg), SpO2 99%.   General: No apparent distress alert and oriented x3 mood and affect normal, dressed appropriately.  HEENT: Pupils equal, extraocular movements intact  Respiratory: Patient's speak in full sentences and does not appear short of breath  Cardiovascular: No lower extremity edema, non tender, no erythema  Gait MSK:  Back does have some loss lordosis noted.  Some tenderness to palpation in the paraspinal musculature.  Osteopathic findings  C2 flexed rotated and side  bent right C6 flexed rotated and side bent left T3 extended rotated and side bent right inhaled rib T9 extended rotated and side bent left T11 extended rotated and side bent left L2 flexed rotated and side bent right L3 flexed rotated and side bent left Sacrum right on right       Assessment and Plan:  Thoracic back pain Significant improvement with patient's weight loss.  Patient has done better.  Now that the cough seems to be almost completely resolved at this time seems to be better as well.  Will continue to monitor.  Will continue to work on [employer].  Will follow-up again in 6 to 8 weeks otherwise.    Nonallopathic problems  Decision today to treat with OMT was based on Physical Exam  After verbal consent patient was treated with HVLA, ME, FPR techniques in cervical, rib, thoracic, lumbar, and sacral  areas  Patient tolerated the procedure well with improvement in symptoms  Patient given exercises, stretches and lifestyle modifications  See medications in patient instructions if given  Patient will follow up in 4-8 weeks     The above documentation has been reviewed and is accurate and complete Keyshon Stein M Montell Leopard, DO         Note: This dictation was prepared with Dragon dictation along with smaller phrase technology. Any transcriptional errors that result from this process are unintentional.

## 2023-09-17 NOTE — Assessment & Plan Note (Signed)
 Significant improvement with patient's weight loss.  Patient has done better.  Now that the cough seems to be almost completely resolved at this time seems to be better as well.  Will continue to monitor.  Will continue to work on [employer] cabin [employer].  Will follow-up again in 6 to 8 weeks otherwise.

## 2023-09-19 ENCOUNTER — Ambulatory Visit: Admitting: Family Medicine

## 2023-09-19 ENCOUNTER — Other Ambulatory Visit (HOSPITAL_COMMUNITY): Payer: Self-pay

## 2023-09-19 DIAGNOSIS — L299 Pruritus, unspecified: Secondary | ICD-10-CM | POA: Diagnosis not present

## 2023-09-19 MED ORDER — HYDROXYZINE HCL 25 MG PO TABS
25.0000 mg | ORAL_TABLET | Freq: Every evening | ORAL | 0 refills | Status: AC
  Filled 2023-09-19: qty 30, 15d supply, fill #0

## 2023-09-19 MED ORDER — TRIAMCINOLONE ACETONIDE 0.1 % EX CREA
1.0000 | TOPICAL_CREAM | Freq: Two times a day (BID) | CUTANEOUS | 0 refills | Status: AC
  Filled 2023-09-19: qty 454, 90d supply, fill #0

## 2023-09-20 ENCOUNTER — Other Ambulatory Visit (HOSPITAL_COMMUNITY): Payer: Self-pay

## 2023-09-20 ENCOUNTER — Other Ambulatory Visit: Payer: Self-pay

## 2023-10-10 ENCOUNTER — Ambulatory Visit: Admitting: Family Medicine

## 2023-10-11 ENCOUNTER — Encounter: Payer: Self-pay | Admitting: Obstetrics and Gynecology

## 2023-10-14 ENCOUNTER — Ambulatory Visit: Admitting: Podiatry

## 2023-10-15 ENCOUNTER — Ambulatory Visit (INDEPENDENT_AMBULATORY_CARE_PROVIDER_SITE_OTHER): Admitting: Podiatry

## 2023-10-15 ENCOUNTER — Other Ambulatory Visit (HOSPITAL_COMMUNITY): Payer: Self-pay

## 2023-10-15 ENCOUNTER — Encounter (HOSPITAL_COMMUNITY): Payer: Self-pay

## 2023-10-15 VITALS — Ht 65.0 in | Wt 129.0 lb

## 2023-10-15 DIAGNOSIS — L603 Nail dystrophy: Secondary | ICD-10-CM

## 2023-10-15 MED ORDER — BETAMETHASONE DIPROPIONATE AUG 0.05 % EX OINT
1.0000 | TOPICAL_OINTMENT | Freq: Every day | CUTANEOUS | 2 refills | Status: AC
  Filled 2023-10-15: qty 45, 45d supply, fill #0

## 2023-10-16 ENCOUNTER — Other Ambulatory Visit (HOSPITAL_COMMUNITY): Payer: Self-pay

## 2023-10-16 NOTE — Progress Notes (Signed)
  Subjective:  Patient ID: Martha [name], female    DOB: Mar 14, 1980,  MRN: [id number]  Chief Complaint  Patient presents with   Nail Problem    RM 2 Patient is here for bilateral onychomycosis follow-up. Nails show some improvement.    43 y.o. female presents with the above complaint. History confirmed with patient.  Has had a few laser sessions now.  Completed the second round of Lamisil .  Objective:  Physical Exam: Toes are cool to touch, no trophic changes or ulcerative lesions, normal DP and PT pulses, normal sensory exam, and areas of onycholysis and transverse Beau lines present both nails some longitudinal streaking and flaking of nails, some discoloration of the bilateral hallux and left third and right fourth toenail   Culture positive for onychomycosis            Assessment:   1. Nail dystrophy      Plan:  Patient was evaluated and treated and all questions answered.  Not much change after laser and second round of Lamisil .  May still take further time for the nail to grow to improve.  We also discussed the possibility of inflammatory nail disease causing this as well.  She does have a history of lichen planus.  I prescribed her topical Temovate  ointment to trial this to see if we have improvement with a steroid treatment.  Intralesional nailbed injections of steroid can be done as well but this can be quite uncomfortable and I am not sure how much improvement we will have with this.  Diprolene  ointment Rx sent to pharmacy.  Return in 6 months to reevaluate.  New photographs taken.  Return in about 6 months (around 04/16/2024) for f/u nail treatment.

## 2023-10-25 ENCOUNTER — Other Ambulatory Visit: Payer: Self-pay

## 2023-11-06 NOTE — Progress Notes (Unsigned)
 [name] [address] [place] [address] Phone: [phone] Subjective:    I'm seeing this patient by the request  of:  Patient, No Pcp Per  CC: Back pain follow-up  YEP:Dlagzrupcz  Martha [name] is a 43 y.o. female coming in with complaint of back and neck pain. OMT 09/17/2023. Patient states that she is ready for adjustment today. Tender near R scapula.  Seems to be fairly severe.  More than patient's usual baseline.  Medications patient has been prescribed: Robaxin   Taking: Intermittently         Reviewed prior external information including notes and imaging from previsou exam, outside providers and external EMR if available.   As well as notes that were available from care everywhere and other healthcare systems.  Past medical history, social, surgical and family history all reviewed in electronic medical record.  No pertanent information unless stated regarding to the chief complaint.   Past Medical History:  Diagnosis Date   Asthma    Lichen planus    MS (multiple sclerosis) (HCC)    PCOS (polycystic ovarian syndrome)    Raynaud's disease     No Known Allergies   Review of Systems:  No headache, visual changes, nausea, vomiting, diarrhea, constipation, dizziness, abdominal pain, skin rash, fevers, chills, night sweats, weight loss, swollen lymph nodes, body aches, joint swelling, chest pain, shortness of breath, mood changes. POSITIVE muscle aches  Objective  Blood pressure 98/72, pulse 74, height 5' 5 (1.651 m), weight 132 lb (59.9 kg), SpO2 100%.   General: No apparent distress alert and oriented x3 mood and affect normal, dressed appropriately.  HEENT: Pupils equal, extraocular movements intact  Respiratory: Patient's speak in full sentences and does not appear short of breath  Cardiovascular: No lower extremity edema, non tender, no erythema  Gait MSK:  Back does have tightness noted in the parascapular area.   Seems to be right greater than left.  Patient's neck exam does have tenderness also on the right side of the neck.  Negative Spurling's noted today.  Osteopathic findings  C2 flexed rotated and side bent right C6 flexed rotated and side bent left T3 extended rotated and side bent right inhaled rib T9 extended rotated and side bent left L2 flexed rotated and side bent right Sacrum right on right       Assessment and Plan:  Thoracic back pain Chronic problem that is multifactorial.  Doing relatively well though with the home exercises overall, some seems to be stress related.  Patient does have large breast tissue that could also be potentially contributing.  Patient has a history of multiple sclerosis but I do not see any true difficulties at this time.  Discussed icing regimen and home exercises, discussed which activities to do and which ones to avoid.  Increase activity slowly.  Follow-up again with me in 6 to 8 weeks    Nonallopathic problems  Decision today to treat with OMT was based on Physical Exam  After verbal consent patient was treated with HVLA, ME, FPR techniques in cervical, rib, thoracic, lumbar, and sacral  areas  Patient tolerated the procedure well with improvement in symptoms  Patient given exercises, stretches and lifestyle modifications  See medications in patient instructions if given  Patient will follow up in 4-8 weeks    The above documentation has been reviewed and is accurate and complete Martha CHRISTELLA Claudene, DO          Note: This dictation was  prepared with Dragon dictation along with smaller phrase technology. Any transcriptional errors that result from this process are unintentional.

## 2023-11-07 ENCOUNTER — Ambulatory Visit (INDEPENDENT_AMBULATORY_CARE_PROVIDER_SITE_OTHER): Admitting: Family Medicine

## 2023-11-07 ENCOUNTER — Encounter: Payer: Self-pay | Admitting: Family Medicine

## 2023-11-07 VITALS — BP 98/72 | HR 74 | Ht 65.0 in | Wt 132.0 lb

## 2023-11-07 DIAGNOSIS — M9902 Segmental and somatic dysfunction of thoracic region: Secondary | ICD-10-CM

## 2023-11-07 DIAGNOSIS — M9904 Segmental and somatic dysfunction of sacral region: Secondary | ICD-10-CM | POA: Diagnosis not present

## 2023-11-07 DIAGNOSIS — M546 Pain in thoracic spine: Secondary | ICD-10-CM

## 2023-11-07 DIAGNOSIS — G8929 Other chronic pain: Secondary | ICD-10-CM | POA: Diagnosis not present

## 2023-11-07 DIAGNOSIS — M9903 Segmental and somatic dysfunction of lumbar region: Secondary | ICD-10-CM | POA: Diagnosis not present

## 2023-11-07 DIAGNOSIS — M9901 Segmental and somatic dysfunction of cervical region: Secondary | ICD-10-CM | POA: Diagnosis not present

## 2023-11-07 DIAGNOSIS — M9908 Segmental and somatic dysfunction of rib cage: Secondary | ICD-10-CM

## 2023-11-07 NOTE — Assessment & Plan Note (Signed)
 Chronic problem that is multifactorial.  Doing relatively well though with the home exercises overall, some seems to be stress related.  Patient does have large breast tissue that could also be potentially contributing.  Patient has a history of multiple sclerosis but I do not see any true difficulties at this time.  Discussed icing regimen and home exercises, discussed which activities to do and which ones to avoid.  Increase activity slowly.  Follow-up again with me in 6 to 8 weeks

## 2023-12-03 NOTE — Progress Notes (Deleted)
  [name] [address] [place] [address] Phone: [phone] Subjective:    I'm seeing this patient by the request  of:  Patient, No Pcp Per  CC:   YEP:Dlagzrupcz  Martha [name] is a 43 y.o. female coming in with complaint of back and neck pain. OMT 11/07/2023. Patient states   Medications patient has been prescribed: Robaxin   Taking:         Reviewed prior external information including notes and imaging from previsou exam, outside providers and external EMR if available.   As well as notes that were available from care everywhere and other healthcare systems.  Past medical history, social, surgical and family history all reviewed in electronic medical record.  No pertanent information unless stated regarding to the chief complaint.   Past Medical History:  Diagnosis Date   Asthma    Lichen planus    MS (multiple sclerosis)    PCOS (polycystic ovarian syndrome)    Raynaud's disease     No Known Allergies   Review of Systems:  No headache, visual changes, nausea, vomiting, diarrhea, constipation, dizziness, abdominal pain, skin rash, fevers, chills, night sweats, weight loss, swollen lymph nodes, body aches, joint swelling, chest pain, shortness of breath, mood changes. POSITIVE muscle aches  Objective  There were no vitals taken for this visit.   General: No apparent distress alert and oriented x3 mood and affect normal, dressed appropriately.  HEENT: Pupils equal, extraocular movements intact  Respiratory: Patient's speak in full sentences and does not appear short of breath  Cardiovascular: No lower extremity edema, non tender, no erythema  Gait MSK:  Back   Osteopathic findings  C2 flexed rotated and side bent right C6 flexed rotated and side bent left T3 extended rotated and side bent right inhaled rib T9 extended rotated and side bent left L2 flexed rotated and side bent right Sacrum right on right        Assessment and Plan:  No problem-specific Assessment & Plan notes found for this encounter.    Nonallopathic problems  Decision today to treat with OMT was based on Physical Exam  After verbal consent patient was treated with HVLA, ME, FPR techniques in cervical, rib, thoracic, lumbar, and sacral  areas  Patient tolerated the procedure well with improvement in symptoms  Patient given exercises, stretches and lifestyle modifications  See medications in patient instructions if given  Patient will follow up in 4-8 weeks             Note: This dictation was prepared with Dragon dictation along with smaller phrase technology. Any transcriptional errors that result from this process are unintentional.

## 2023-12-04 ENCOUNTER — Ambulatory Visit: Admitting: Family Medicine

## 2023-12-05 DIAGNOSIS — Z1331 Encounter for screening for depression: Secondary | ICD-10-CM | POA: Diagnosis not present

## 2023-12-05 DIAGNOSIS — Z01419 Encounter for gynecological examination (general) (routine) without abnormal findings: Secondary | ICD-10-CM | POA: Diagnosis not present

## 2023-12-18 ENCOUNTER — Telehealth: Payer: Self-pay | Admitting: Pharmacist

## 2023-12-18 NOTE — Telephone Encounter (Signed)
 Pharmacy Patient Advocate Encounter  Received notification from [facility] that Prior Authorization for NURTEC 75 MG PO TBDP has been APPROVED from 12/18/2023 to 12/16/2024   PA #/Case ID/Reference #: [id number]

## 2023-12-18 NOTE — Telephone Encounter (Signed)
 Pharmacy Patient Advocate Encounter   Received notification from CoverMyMeds that prior authorization for Nurtec 75MG  dispersible tablets is required/requested.   Insurance verification completed.   The patient is insured through [facility].   Per test claim: PA required; PA submitted to above mentioned insurance via Latent Key/confirmation #/EOC [id number] Status is pending

## 2023-12-23 ENCOUNTER — Ambulatory Visit
Admission: RE | Admit: 2023-12-23 | Discharge: 2023-12-23 | Disposition: A | Source: Ambulatory Visit | Attending: Obstetrics and Gynecology | Admitting: Obstetrics and Gynecology

## 2023-12-23 ENCOUNTER — Other Ambulatory Visit: Payer: Self-pay | Admitting: Obstetrics and Gynecology

## 2023-12-23 DIAGNOSIS — R921 Mammographic calcification found on diagnostic imaging of breast: Secondary | ICD-10-CM

## 2023-12-23 DIAGNOSIS — R928 Other abnormal and inconclusive findings on diagnostic imaging of breast: Secondary | ICD-10-CM | POA: Diagnosis not present

## 2023-12-23 DIAGNOSIS — N6489 Other specified disorders of breast: Secondary | ICD-10-CM | POA: Diagnosis not present

## 2023-12-23 NOTE — Progress Notes (Unsigned)
 Darlyn Claudene [name] [address] [place] [address] Phone: [phone] Subjective:   Martha [name], am serving as a scribe for Dr. Arthea Claudene.  I'm seeing this patient by the request  of:  Patient, No Pcp Per  CC: Neck and back pain follow-up  YEP:Dlagzrupcz  Martha [name] is a 43 y.o. female coming in with complaint of back and neck pain. OMT on 11/07/2023. Patient states that she has been doing the same as last visit.  Patient has been doing relatively well but continues to have some stress related to her job.  Luckily her husband is feeling better and that has been more beneficial for some of her mood recently.  Medications patient has been prescribed:   Taking:         Reviewed prior external information including notes and imaging from previsou exam, outside providers and external EMR if available.   As well as notes that were available from care everywhere and other healthcare systems.  Past medical history, social, surgical and family history all reviewed in electronic medical record.  No pertanent information unless stated regarding to the chief complaint.   Past Medical History:  Diagnosis Date   Asthma    Lichen planus    MS (multiple sclerosis)    PCOS (polycystic ovarian syndrome)    Raynaud's disease     No Known Allergies   Review of Systems:  No headache, visual changes, nausea, vomiting, diarrhea, constipation, dizziness, abdominal pain, skin rash, fevers, chills, night sweats, weight loss, swollen lymph nodes, body aches, joint swelling, chest pain, shortness of breath, mood changes. POSITIVE muscle aches  Objective  Blood pressure 122/82, pulse 79, height 5' 5 (1.651 m), weight 132 lb (59.9 kg), SpO2 98%.   General: No apparent distress alert and oriented x3 mood and affect normal, dressed appropriately.  HEENT: Pupils equal, extraocular movements intact  Respiratory: Patient's speak in full sentences and does not  appear short of breath  Cardiovascular: No lower extremity edema, non tender, no erythema  Gait MSK:  Back tenderness more in the thoracolumbar juncture and more in the parascapular area.  Neck exam does have some loss of sidebending bilaterally.  Osteopathic findings  C2 flexed rotated and side bent right C6 flexed rotated and side bent left T3 extended rotated and side bent right inhaled rib T9 extended rotated and side bent left L2 flexed rotated and side bent right L3 flexed rotated and side bent left Sacrum right on right       Assessment and Plan:  Thoracic back pain Chronic problem, discussed icing regimen and home exercises, discussed which activities to do and which ones to avoid.  Patient has done relatively well overall.  Workup has been fairly unremarkable otherwise.  No signs of any type of MS flare at the moment.  Follow-up again in 6 to 8 weeks    Nonallopathic problems  Decision today to treat with OMT was based on Physical Exam  After verbal consent patient was treated with HVLA, ME, FPR techniques in cervical, rib, thoracic, lumbar, and sacral  areas avoided HVLA on the cervical spine  Patient tolerated the procedure well with improvement in symptoms  Patient given exercises, stretches and lifestyle modifications  See medications in patient instructions if given  Patient will follow up in 4-8 weeks     The above documentation has been reviewed and is accurate and complete Maxi Carreras M Delorse Shane, DO         Note:  This dictation was prepared with Dragon dictation along with smaller phrase technology. Any transcriptional errors that result from this process are unintentional.

## 2023-12-24 ENCOUNTER — Ambulatory Visit (INDEPENDENT_AMBULATORY_CARE_PROVIDER_SITE_OTHER): Admitting: Family Medicine

## 2023-12-24 VITALS — BP 122/82 | HR 79 | Ht 65.0 in | Wt 132.0 lb

## 2023-12-24 DIAGNOSIS — M9901 Segmental and somatic dysfunction of cervical region: Secondary | ICD-10-CM

## 2023-12-24 DIAGNOSIS — M9908 Segmental and somatic dysfunction of rib cage: Secondary | ICD-10-CM

## 2023-12-24 DIAGNOSIS — M546 Pain in thoracic spine: Secondary | ICD-10-CM

## 2023-12-24 DIAGNOSIS — G8929 Other chronic pain: Secondary | ICD-10-CM | POA: Diagnosis not present

## 2023-12-24 DIAGNOSIS — M9903 Segmental and somatic dysfunction of lumbar region: Secondary | ICD-10-CM | POA: Diagnosis not present

## 2023-12-24 DIAGNOSIS — M9902 Segmental and somatic dysfunction of thoracic region: Secondary | ICD-10-CM | POA: Diagnosis not present

## 2023-12-24 DIAGNOSIS — M9904 Segmental and somatic dysfunction of sacral region: Secondary | ICD-10-CM

## 2023-12-25 ENCOUNTER — Encounter: Payer: Self-pay | Admitting: Family Medicine

## 2023-12-25 NOTE — Assessment & Plan Note (Signed)
 Chronic problem, discussed icing regimen and home exercises, discussed which activities to do and which ones to avoid.  Patient has done relatively well overall.  Workup has been fairly unremarkable otherwise.  No signs of any type of MS flare at the moment.  Follow-up again in 6 to 8 weeks

## 2023-12-27 ENCOUNTER — Other Ambulatory Visit (HOSPITAL_BASED_OUTPATIENT_CLINIC_OR_DEPARTMENT_OTHER): Payer: Self-pay

## 2023-12-27 MED ORDER — FLUZONE 0.5 ML IM SUSY
0.5000 mL | PREFILLED_SYRINGE | Freq: Once | INTRAMUSCULAR | 0 refills | Status: AC
  Filled 2023-12-27: qty 0.5, 1d supply, fill #0

## 2023-12-27 MED ORDER — COMIRNATY 30 MCG/0.3ML IM SUSY
0.3000 mL | PREFILLED_SYRINGE | Freq: Once | INTRAMUSCULAR | 0 refills | Status: AC
  Filled 2023-12-27: qty 0.3, 1d supply, fill #0

## 2024-01-01 ENCOUNTER — Ambulatory Visit: Admitting: Family Medicine

## 2024-01-01 ENCOUNTER — Other Ambulatory Visit (HOSPITAL_COMMUNITY): Payer: Self-pay

## 2024-01-01 MED ORDER — ONDANSETRON HCL 4 MG PO TABS
4.0000 mg | ORAL_TABLET | Freq: Two times a day (BID) | ORAL | 3 refills | Status: AC | PRN
  Filled 2024-01-01 – 2024-01-03 (×2): qty 60, 30d supply, fill #0

## 2024-01-01 MED ORDER — RIZATRIPTAN BENZOATE 5 MG PO TBDP
5.0000 mg | ORAL_TABLET | Freq: Every day | ORAL | 5 refills | Status: AC | PRN
  Filled 2024-01-01 – 2024-01-03 (×2): qty 27, 30d supply, fill #0
  Filled 2024-03-27: qty 27, 30d supply, fill #1

## 2024-01-03 ENCOUNTER — Other Ambulatory Visit (HOSPITAL_BASED_OUTPATIENT_CLINIC_OR_DEPARTMENT_OTHER): Payer: Self-pay

## 2024-01-03 ENCOUNTER — Other Ambulatory Visit (HOSPITAL_COMMUNITY): Payer: Self-pay

## 2024-01-04 ENCOUNTER — Other Ambulatory Visit (HOSPITAL_BASED_OUTPATIENT_CLINIC_OR_DEPARTMENT_OTHER): Payer: Self-pay

## 2024-01-06 ENCOUNTER — Other Ambulatory Visit (HOSPITAL_BASED_OUTPATIENT_CLINIC_OR_DEPARTMENT_OTHER): Payer: Self-pay

## 2024-01-06 ENCOUNTER — Other Ambulatory Visit (HOSPITAL_COMMUNITY): Payer: Self-pay

## 2024-01-07 ENCOUNTER — Other Ambulatory Visit (HOSPITAL_BASED_OUTPATIENT_CLINIC_OR_DEPARTMENT_OTHER): Payer: Self-pay

## 2024-01-20 ENCOUNTER — Telehealth: Payer: Self-pay | Admitting: Neurology

## 2024-01-20 NOTE — Telephone Encounter (Signed)
 ..  Pt understands that although there may be some limitations with this type of visit, we will take all precautions to reduce any security or privacy concerns.  Pt understands that this will be treated like an in office visit and we will file with pt's insurance, and there may be a patient responsible charge related to this service. ? ?

## 2024-01-24 ENCOUNTER — Ambulatory Visit: Attending: Cardiology | Admitting: Cardiology

## 2024-01-24 ENCOUNTER — Other Ambulatory Visit: Payer: Self-pay

## 2024-01-24 ENCOUNTER — Other Ambulatory Visit (HOSPITAL_BASED_OUTPATIENT_CLINIC_OR_DEPARTMENT_OTHER): Payer: Self-pay

## 2024-01-24 ENCOUNTER — Encounter: Payer: Self-pay | Admitting: Cardiology

## 2024-01-24 VITALS — BP 120/74 | HR 97 | Ht 65.0 in | Wt 125.9 lb

## 2024-01-24 DIAGNOSIS — R002 Palpitations: Secondary | ICD-10-CM | POA: Diagnosis not present

## 2024-01-24 MED ORDER — DILTIAZEM HCL ER COATED BEADS 180 MG PO CP24
180.0000 mg | ORAL_CAPSULE | Freq: Every day | ORAL | 3 refills | Status: AC
  Filled 2024-01-24: qty 90, 90d supply, fill #0

## 2024-01-24 NOTE — Patient Instructions (Signed)
 Medication Instructions:  Your physician has recommended you make the following change in your medication:  START Diltiazem  180 mg daily  *If you need a refill on your cardiac medications before your next appointment, please call your pharmacy*  Lab Work: None ordered  If you have any lab test that is abnormal or we need to change your treatment, we will call you to review the results.  Testing/Procedures: None ordered  Follow-Up: At [facility], you and your health needs are our priority.  As part of our continuing mission to provide you with exceptional heart care, our providers are all part of one team.  This team includes your primary Cardiologist (physician) and Advanced Practice Providers or APPs (Physician Assistants and Nurse Practitioners) who all work together to provide you with the care you need, when you need it.  Your next appointment:   3 month(s)  Provider:   Soyla Norton, MD     Thank you for choosing [facility]!!   [phone]

## 2024-01-24 NOTE — Progress Notes (Signed)
 Electrophysiology Office Note:   Date:  01/24/2024  ID:  Martha [name], DOB 12/22/1980, MRN [id number]  Primary Cardiologist: None Primary Heart Failure: None Electrophysiologist: Ares Cardozo Gladis Norton, MD      History of Present Illness:   Martha [name] is a 43 y.o. female with h/o relapsing remitting multiple sclerosis, PCOS, Raynaud's, palpitations/SVT seen today for routine electrophysiology followup.   Discussed the use of AI scribe software for clinical note transcription with the patient, who gave verbal consent to proceed.  History of Present Illness Dr. Jazell Rosenau is a 43 year old female with multiple sclerosis and anemia who presents with palpitations and increased heart rate.  Over the past two weeks, she has experienced an increased heart rate, even during sleep, with episodes reaching up to 100 beats per minute. She describes the sensation as painful and likens it to doing backflips in her chest. Her resting heart rate is typically in the 60s according to her phone and watch, but it has been elevated recently. She sometimes feels like her heart is pounding during these episodes.  She has a history of multiple sclerosis, which she reports is under control for the most part. She also mentions a long-standing history of anemia, with consistently low MCH and MCHC levels, though she does not believe this is related to her current symptoms.  She experiences migraines, particularly when the weather changes, and reports having a headache on the day of the visit. She notes that her migraines are intense but does not believe they are related to her heart symptoms.  She has a history of weight loss through intermittent fasting and eliminating refined carbs, which she continues to maintain. She previously tried Wegovy  but discontinued it due to side effects, including nausea and tachycardia.  she denies chest pain, palpitations, dyspnea, PND, orthopnea, nausea, vomiting, dizziness,  syncope, edema, weight gain, or early satiety.   Review of systems complete and found to be negative unless listed in HPI.   EP Information / Studies Reviewed:    EKG is ordered today. Personal review as below.  EKG Interpretation Date/Time:  Friday January 24 2024 14:08:47 EST Ventricular Rate:  97 PR Interval:  124 QRS Duration:  78 QT Interval:  336 QTC Calculation: 426 R Axis:   84  Text Interpretation: Normal sinus rhythm Normal ECG When compared with ECG of 11-Oct-2019 22:12, No significant change since last tracing Confirmed by Tereka Thorley ([id number]) on 01/24/2024 2:14:02 PM     Risk Assessment/Calculations:           Physical Exam:   VS:  BP 120/74 (BP Location: Right Arm, Patient Position: Sitting, Cuff Size: Normal)   Pulse 97   Ht 5' 5 (1.651 m)   Wt 125 lb 14.4 oz (57.1 kg)   SpO2 99%   BMI 20.95 kg/m    Wt Readings from Last 3 Encounters:  01/24/24 125 lb 14.4 oz (57.1 kg)  12/24/23 132 lb (59.9 kg)  11/07/23 132 lb (59.9 kg)     GEN: Well nourished, well developed in no acute distress NECK: No JVD; No carotid bruits CARDIAC: Regular rate and rhythm, no murmurs, rubs, gallops RESPIRATORY:  Clear to auscultation without rales, wheezing or rhonchi  ABDOMEN: Soft, non-tender, non-distended EXTREMITIES:  No edema; No deformity   ASSESSMENT AND PLAN:    1.  Palpitations: Possibly due to atrial tachycardia.  She had an episode of palpitations a few months prior which spontaneously resolved.  Over the last 2 weeks, she is continue  to have heart rates that are somewhat more rapid.  Baseline heart rates are now in the 90s where they were previously in the 60s.  She feels significant palpitations.  Jedadiah Abdallah start diltiazem  180 mg daily.  This could be a short-term medication.  Follow up with Dr. Inocencio in 3 months  Signed, Lissa Rowles Gladis Inocencio, MD

## 2024-01-29 ENCOUNTER — Telehealth (INDEPENDENT_AMBULATORY_CARE_PROVIDER_SITE_OTHER): Admitting: Neurology

## 2024-01-29 ENCOUNTER — Encounter: Payer: Self-pay | Admitting: Neurology

## 2024-01-29 ENCOUNTER — Other Ambulatory Visit (HOSPITAL_BASED_OUTPATIENT_CLINIC_OR_DEPARTMENT_OTHER): Payer: Self-pay

## 2024-01-29 ENCOUNTER — Other Ambulatory Visit: Payer: Self-pay

## 2024-01-29 ENCOUNTER — Other Ambulatory Visit (HOSPITAL_COMMUNITY): Payer: Self-pay

## 2024-01-29 DIAGNOSIS — G4700 Insomnia, unspecified: Secondary | ICD-10-CM

## 2024-01-29 DIAGNOSIS — R202 Paresthesia of skin: Secondary | ICD-10-CM

## 2024-01-29 DIAGNOSIS — G43001 Migraine without aura, not intractable, with status migrainosus: Secondary | ICD-10-CM | POA: Diagnosis not present

## 2024-01-29 DIAGNOSIS — G35A Relapsing-remitting multiple sclerosis: Secondary | ICD-10-CM

## 2024-01-29 DIAGNOSIS — E559 Vitamin D deficiency, unspecified: Secondary | ICD-10-CM | POA: Diagnosis not present

## 2024-01-29 DIAGNOSIS — Z79899 Other long term (current) drug therapy: Secondary | ICD-10-CM | POA: Diagnosis not present

## 2024-01-29 DIAGNOSIS — Z8669 Personal history of other diseases of the nervous system and sense organs: Secondary | ICD-10-CM

## 2024-01-29 MED ORDER — NURTEC 75 MG PO TBDP
75.0000 mg | ORAL_TABLET | Freq: Every day | ORAL | 3 refills | Status: AC | PRN
  Filled 2024-01-29: qty 16, 30d supply, fill #0

## 2024-01-29 MED ORDER — DOXEPIN HCL 10 MG PO CAPS
10.0000 mg | ORAL_CAPSULE | Freq: Every day | ORAL | 3 refills | Status: AC
  Filled 2024-01-29: qty 90, 90d supply, fill #0

## 2024-01-29 MED ORDER — ZOLPIDEM TARTRATE 10 MG PO TABS
5.0000 mg | ORAL_TABLET | Freq: Every day | ORAL | 5 refills | Status: AC
  Filled 2024-01-29: qty 30, 30d supply, fill #0
  Filled 2024-03-27: qty 30, 30d supply, fill #1

## 2024-01-29 NOTE — Progress Notes (Signed)
 [facility]   PATIENT: Martha [name] DOB: 04/19/1980  REFERRING DOCTOR OR PCP:  Shona Beckmann [facility] Neurology) SOURCE: Patient, notes from neurology, laboratory reports, imaging reports, MRI images personally reviewed.  _________________________________   HISTORICAL  CHIEF COMPLAINT:  Chief Complaint  Patient presents with   Multiple Sclerosis    On Vumerity    Virtual Visit via Video Note I connected with [name]  on 01/29/24 at  8:30 AM EST by a video enabled telemedicine application and verified that I am speaking with the correct person.  I discussed the limitations of evaluation and management by telemedicine and the availability of in person appointments. The patient expressed understanding and agreed to proceed.  Locations:  Patient at home; provider in office  Follow Up Instructions: I discussed the assessment and treatment plan with the patient. The patient was provided an opportunity to ask questions and all were answered. The patient agreed with the plan and demonstrated an understanding of the instructions.    The patient was advised to call back or seek an in-person evaluation if the symptoms worsen or if the condition fails to improve as anticipated.  I provided 20 minutes of non-face-to-face time during this encounter.  HISTORY OF PRESENT ILLNESS:  Martha [name] is a 43 y.o. woman with multiple sclerosis.  Update 01/29/2024:  She is on Vumerity  and tolerates it well. She flushes often if she takes without food.   No GI issues.   She denies new symptoms or exacerbation.   10/24 MRI stable compared to 2023.   Gait and balance are good on a flat surface.  She had one fall in 2022 on stairs so she usually holds the bannister.  She has paresthesias in her legs  Vision is better than 20/20.   ON was her first exacerbatin in 2009 (OD ON)   Color vision is fine.   She sees [name] in a couple months.       She is having migrainous  headaches - unilateral and  around eye.  She often wakes up with them but they improve as they wake up.    They occur about 5 a month for 4 or more hours). Often with migrainous features (Nausea, phonophobia) and she takes Excedrin if at work and Maxalt  (makes her sleepy)      Some are triggered by barometric change.  Nurtec helped as well (had samples never a script).  She has not tried an injectable.    She tried Topamax in the past with no benefit.    Imipramine  caused dry mouth so she stopped  Her sleep quality is often poor but she does not snore.  She has sleep maintenance issues.   Ambien  5 mg helps her fall asleep if she has sleep onset issues (takes 2-3 pills a week) but she wakes up 4 hours later. She takes just a couple nights/week   Doxepin  sometimes helped her stay asleep.   Trazodone caused nasal congestant and she stopped.    She changed jobs and now is working days only.     Fatigue is doing ok.  She is working 11-12 hour days (from home - utilization)   Vision is fine.   Bladder is fine.      She has lost weight down to 125 pounds (85- 90 pounds) on purpose, helped by semaglutide  and diet.       PE:  She is a well-developed well-nourished woman in no acute distress.  The head is  normocephalic and atraumatic.  Sclera are anicteric.  Visible skin appears normal.  The neck has a good range of motion.   She is alert and fully oriented with fluent speech and good attention, knowledge and memory.  Extraocular muscles are intact.  Facial strength is normal.  She appears to have normal strength in the arms.  Rapid alternating movements and finger-nose-finger are performed well.   MS History She was diagnosed with MS in 2009 after presenting with right optic neuritis.  She initially saw ophthalmology and was referred to neuro-ophthalmology (Dr. [name]).  After an MRI showed changes consistent with MS, she was referred to Dr. [name], at [facility] neurology.     She was placed on Avonex and tolerated it  well.   Due to LFT elevation, she switched to Copaxone.    She had tolerability issues and switched to Tecfidera in 2013.  She had some mild liver elevation.  Her MS was stable on all of these medications.  She stopped Tecfidera in 2017 due to family planning issues.    She has not been able to get pregnant and is ready to restart therapy.  She started Vumerity  August 2021.  She has low vitamin D  and takes supplements weekly.       Imaging:  MRI of the brain 12/05/2018 showed multiple T2/FLAIR hyperintense foci, predominantly in the periventricular white matter radially oriented to the ventricles.  A couple foci were also noted in the juxtacortical white matter, possibly a couple punctate foci in the pons, deep white matter and one focus in the right cerebellar hemisphere.  None of the foci enhanced.  There were no films for comparison.    She reports a cervical spine MRI in 2013 did not show any plaque.      MRI lumbar in 2015 showed mild facet hypertrophy at a few levels.     MRI brain 11/03/2019 showed multiple T2/FLAIR hyperintense foci in the periventricular, juxtacortical and deep white matter of both hemispheres consistent with demyelinating plaque associated with multiple sclerosis. Most of the foci were present on the 12/05/2018 MRI. However, there is one new juxtacortical focus in the right hemisphere. That focus also enhances after contrast implying a more acute or subacute timeframe.  MRI cervical spine 11/03/2019 showed a normal spinal cord and no significant DJD.  MRI brain 01/03/2023 showed no new lesions compared to April, 2023  OTHER: She had Lichen Planus on the left arm and has a sore in her mouth potentially the same diagnosis.  She is going to see an oral surgeon for possible biopsy.   REVIEW OF SYSTEMS: Constitutional: No fevers, chills, sweats, or change in appetite Eyes: No visual changes, double vision, eye pain Ear, nose and throat: No hearing loss, ear pain, nasal  congestion, sore throat.  She currently has a sore in her mouth potentially worrisome for lichen planus Cardiovascular: No chest pain, palpitations Respiratory:  No shortness of breath at rest or with exertion.   No wheezes GastrointestinaI: No nausea, vomiting, diarrhea, abdominal pain, fecal incontinence Genitourinary:  No dysuria, urinary retention or frequency.  No nocturia. Musculoskeletal:  No neck pain.  She has some back pain. Integumentary: No rash, pruritus.  She had lichen planus in the left arm Neurological: as above Psychiatric: No depression at this time.  No anxiety Endocrine: No palpitations, diaphoresis, change in appetite, change in weigh or increased thirst Hematologic/Lymphatic:  No anemia, purpura, petechiae. Allergic/Immunologic: No itchy/runny eyes, nasal congestion, recent allergic reactions, rashes  ALLERGIES: No  Known Allergies  HOME MEDICATIONS:  Current Outpatient Medications:    doxepin  (SINEQUAN ) 10 MG capsule, Take 1 capsule (10 mg total) by mouth at bedtime., Disp: 90 capsule, Rfl: 3   augmented betamethasone  dipropionate (DIPROLENE -AF) 0.05 % ointment, Apply 1 Application topically daily., Disp: 45 g, Rfl: 2   diltiazem  (CARDIZEM  CD) 180 MG 24 hr capsule, Take 1 capsule (180 mg total) by mouth daily., Disp: 90 capsule, Rfl: 3   hydrOXYzine  (ATARAX ) 25 MG tablet, Take 1-2 tablets (25-50 mg total) by mouth at night., Disp: 30 tablet, Rfl: 0   imipramine  (TOFRANIL ) 25 MG tablet, Take 1-2 tablets (25-50 mg total) by mouth at bedtime., Disp: 60 tablet, Rfl: 11   methocarbamol  (ROBAXIN ) 500 MG tablet, Take 1 tablet (500 mg total) by mouth 3 (three) times daily., Disp: 90 tablet, Rfl: 0   nitroGLYCERIN  (NITROGLYN) 2 % OINT ointment, Apply 1 Application topically every 12 (twelve) hours. To affected toes, Disp: 60 g, Rfl: 2   ondansetron  (ZOFRAN ) 4 MG tablet, Take 1 tablet (4 mg total) by mouth 2 (two) times daily as needed., Disp: 60 tablet, Rfl: 3   ondansetron   (ZOFRAN -ODT) 4 MG disintegrating tablet, Dissolve 2 tablets (8 mg total) in the mouth 2 (two) times daily., Disp: 60 tablet, Rfl: 1   Rimegepant Sulfate  (NURTEC) 75 MG TBDP, Take 1 tablet (75 mg total) by mouth daily as needed for migraine., Disp: 30 tablet, Rfl: 3   rizatriptan  (MAXALT -MLT) 5 MG disintegrating tablet, Take 1 tablet (5 mg total) by mouth daily as needed., Disp: 30 tablet, Rfl: 5   tretinoin  (RETIN-A ) 0.05 % cream, Apply 1 Application topically to the face every evening., Disp: 60 g, Rfl: 3   triamcinolone  cream (KENALOG ) 0.1 %, Apply 1 Application topically 2 (two) times daily., Disp: 454 g, Rfl: 0   VUMERITY  231 MG CPDR, Take 2 capsules (462 mg) by mouth in the morning and at bedtime., Disp: 120 capsule, Rfl: 7   zolpidem  (AMBIEN ) 10 MG tablet, Take 1/2-1 tablet (5-10 mg total) by mouth at bedtime., Disp: 30 tablet, Rfl: 5  PAST MEDICAL HISTORY: Past Medical History:  Diagnosis Date   Asthma    Lichen planus    MS (multiple sclerosis)    PCOS (polycystic ovarian syndrome)    Raynaud's disease     PAST SURGICAL HISTORY: Past Surgical History:  Procedure Laterality Date   BREAST BIOPSY Left 12/11/2022   MM LT BREAST BX W LOC DEV 1ST LESION IMAGE BX SPEC STEREO GUIDE 12/11/2022 [facility]   BREAST BIOPSY Left 12/11/2022   MM LT BREAST BX W LOC DEV EA AD LESION IMG BX SPEC STEREO GUIDE 12/11/2022 [facility]   colonoscopy     DILATION AND CURETTAGE OF UTERUS     x 3   ESOPHAGOGASTRODUODENOSCOPY     TONSILLECTOMY AND ADENOIDECTOMY      FAMILY HISTORY: Family History  Problem Relation Age of Onset   Diabetes Mother    Thyroid  disease Mother    Hypertension Mother    Hypercholesterolemia Mother    Migraines Mother    Depression Mother    Diabetes Father    Thyroid  disease Father    CAD Father    Hypercholesterolemia Father    Hypertension Father    Depression Father    Pernicious anemia Father    Breast cancer Paternal Aunt     SOCIAL  HISTORY:  Social History   Socioeconomic History   Marital status: Married    Spouse name: Not  on file   Number of children: 0   Years of education: college   Highest education level: Professional school degree (e.g., MD, DDS, DVM, JD)  Occupational History   Occupation: physician  Tobacco Use   Smoking status: Never   Smokeless tobacco: Never  Vaping Use   Vaping status: Never Used  Substance and Sexual Activity   Alcohol use: No   Drug use: No   Sexual activity: Not on file  Other Topics Concern   Not on file  Social History Narrative   Right handed   1-2 cups caffeine daily   Lives at home with husband   Social Drivers of Health   Financial Resource Strain: Not on file  Food Insecurity: Not on file  Transportation Needs: Not on file  Physical Activity: Not on file  Stress: Stress Concern Present (04/01/2023)   Received from [facility]   [employer] of Occupational Health - Occupational Stress Questionnaire    Feeling of Stress : Very much  Social Connections: Not on file  Intimate Partner Violence: Not on file     PHYSICAL EXAM  There were no vitals filed for this visit.   There is no height or weight on file to calculate BMI.   General: The patient is well-developed and well-nourished and in no acute distress  HEENT:  Head is [place]/AT.  Sclera are anicteric.    Skin: Extremities are without rash or  edema.  Neurologic Exam  Mental status: The patient is alert and oriented x 3 at the time of the examination. The patient has apparent normal recent and remote memory, with an apparently normal attention span and concentration ability.   Speech is normal.  Cranial nerves: Extraocular movements are full.     Facial strength is symmetric.  No obvious hearing deficits are noted.  Motor:  Muscle bulk is normal.   Tone is normal. Strength is  5 / 5 in all 4 extremities.   Sensory: She had normal sensation to touch and vibration in the arms  and legs.    Coordination: Cerebellar testing reveals good finger-nose-finger and heel-to-shin bilaterally.  Gait and station: Station is normal.   Gait is normal. Tandem is minimally wide.    Romberg is normal.  Reflexes: Deep tendon reflexes are symmetric and normal bilaterally.        DIAGNOSTIC DATA (LABS, IMAGING, TESTING) - I reviewed patient records, labs, notes, testing and imaging myself where available.  Lab Results  Component Value Date   WBC 4.3 06/24/2023   HGB 12.0 06/24/2023   HCT 41.0 06/24/2023   MCV 80 06/24/2023   PLT 370 06/24/2023      Component Value Date/Time   NA 142 06/24/2023 0911   K 5.1 06/24/2023 0911   CL 105 06/24/2023 0911   CO2 25 06/24/2023 0911   GLUCOSE 70 06/24/2023 0911   GLUCOSE 125 (H) 10/12/2019 0005   BUN 11 06/24/2023 0911   CREATININE 0.71 06/24/2023 0911   CALCIUM 9.2 06/24/2023 0911   PROT 6.5 06/24/2023 0911   ALBUMIN 4.4 06/24/2023 0911   AST 10 06/24/2023 0911   ALT 13 06/24/2023 0911   ALKPHOS 44 06/24/2023 0911   BILITOT 0.4 06/24/2023 0911   GFRNONAA >60 10/12/2019 0005   GFRAA >60 10/12/2019 0005       ASSESSMENT AND PLAN  Multiple sclerosis, relapsing-remitting - Plan: CBC with Differential/Platelet  High risk medication use - Plan: CBC with Differential/Platelet  Vitamin D  deficiency  Insomnia,  unspecified type  Migraine without aura and with status migrainosus, not intractable  Paresthesia  History of optic neuritis   1.  Her MS is doing well with no recent exacerbation.  Continue Vumerity .  Check CBC with differential  2.   for episodic migraines   Continue prn Maxalt   or  Nurtec  3.   continue Ambien  5 mg p.o. nightly as needed.  Add doxepin  10 mg nightly 4.   Return to see me in 6 months or sooner for new or worsening neurologic symptoms.   Nasif Bos A. Vear, MD, [facility] 01/29/2024, 8:51 AM Certified in Neurology, Clinical Neurophysiology, Sleep Medicine and Neuroimaging  [facility]  [facility] [address] [place], [place] [address] [phone]

## 2024-02-03 NOTE — Progress Notes (Unsigned)
 [name] [address] [place] [address] Phone: [phone] Subjective:   Martha [name], am serving as a scribe for Dr. Arthea Claudene.  I'Martha seeing this patient by the request  of:  Patient, No Pcp Per  CC: Back and neck pain follow-up  YEP:Dlagzrupcz  Martha [name] is a 43 y.o. female coming in with complaint of back and neck pain. OMT on 12/24/2023. Patient states same per usual. No new symptoms.          Reviewed prior external information including notes and imaging from previsou exam, outside providers and external EMR if available.   As well as notes that were available from care everywhere and other healthcare systems.  Past medical history, social, surgical and family history all reviewed in electronic medical record.  No pertanent information unless stated regarding to the chief complaint.   Past Medical History:  Diagnosis Date   Asthma    Lichen planus    MS (multiple sclerosis)    PCOS (polycystic ovarian syndrome)    Raynaud's disease     No Known Allergies   Review of Systems:  No headache, visual changes, nausea, vomiting, diarrhea, constipation, dizziness, abdominal pain, skin rash, fevers, chills, night sweats, weight loss, swollen lymph nodes, body aches, joint swelling, chest pain, shortness of breath, mood changes. POSITIVE muscle aches has had significant amount of palpitations.  Objective  Blood pressure 108/78, pulse 93, height 5' 5 (1.651 Martha), weight 127 lb (57.6 kg), SpO2 99%.   General: No apparent distress alert and oriented x3 mood and affect normal, dressed appropriately.  HEENT: Pupils equal, extraocular movements intact  Respiratory: Patient's speak in full sentences and does not appear short of breath  Cardiovascular: No lower extremity edema, non tender, no erythema  Gait MSK:  Back does have some loss of lordosis noted.  Tenderness to palpation in the paraspinal musculature.  Osteopathic  findings  C2 flexed rotated and side bent right C6 flexed rotated and side bent left T3 extended rotated and side bent right inhaled rib T9 extended rotated and side bent left L2 flexed rotated and side bent right Sacrum right on right       Assessment and Plan:  Thoracic back pain Chronic problem noted, discussed icing regimen and home exercises, discussed which activities to do and which ones to avoid.  Patient has been continue to work on core strengthening.  Has done extremely well though with weight loss and I do think that this is helped out significantly.  Responds well to osteopathic ambulation.  Follow-up again in 6 to 12 weeks  Palpitations Has had significant difficulty with palpitations.  Patient does have her Apple watch and shows that she can have a resting heart rate of 140.  Has been seen by electrophysiology.  Do think that a echocardiogram of the heart with a bubble study could be beneficial to rule out PFO.  Past medical history significant for MS that could be also a potential.  Could consider the possibility of sedimentation rate to see if there is any type of inflammation.  Patient wanted to hold on my part but will go with the echocardiogram and see if there is any other further evaluation and would have to follow-up with cardiology.    Nonallopathic problems  Decision today to treat with OMT was based on Physical Exam  After verbal consent patient was treated with HVLA, ME, FPR techniques in cervical, rib, thoracic, lumbar, and sacral  areas  Patient tolerated the procedure well with improvement in symptoms  Patient given exercises, stretches and lifestyle modifications  See medications in patient instructions if given  Patient will follow up in 4-8 weeks     The above documentation has been reviewed and is accurate and complete Martha [name] Martha Lynanne Delgreco, DO         Note: This dictation was prepared with Dragon dictation along with smaller phrase technology.  Any transcriptional errors that result from this process are unintentional.

## 2024-02-05 ENCOUNTER — Encounter: Payer: Self-pay | Admitting: Family Medicine

## 2024-02-05 ENCOUNTER — Ambulatory Visit: Admitting: Family Medicine

## 2024-02-05 VITALS — BP 108/78 | HR 93 | Ht 65.0 in | Wt 127.0 lb

## 2024-02-05 DIAGNOSIS — M9903 Segmental and somatic dysfunction of lumbar region: Secondary | ICD-10-CM | POA: Diagnosis not present

## 2024-02-05 DIAGNOSIS — R002 Palpitations: Secondary | ICD-10-CM | POA: Insufficient documentation

## 2024-02-05 DIAGNOSIS — M9902 Segmental and somatic dysfunction of thoracic region: Secondary | ICD-10-CM

## 2024-02-05 DIAGNOSIS — M9901 Segmental and somatic dysfunction of cervical region: Secondary | ICD-10-CM

## 2024-02-05 DIAGNOSIS — M546 Pain in thoracic spine: Secondary | ICD-10-CM | POA: Diagnosis not present

## 2024-02-05 DIAGNOSIS — M9908 Segmental and somatic dysfunction of rib cage: Secondary | ICD-10-CM

## 2024-02-05 DIAGNOSIS — G8929 Other chronic pain: Secondary | ICD-10-CM

## 2024-02-05 DIAGNOSIS — M9904 Segmental and somatic dysfunction of sacral region: Secondary | ICD-10-CM

## 2024-02-05 NOTE — Assessment & Plan Note (Signed)
 Has had significant difficulty with palpitations.  Patient does have her Apple watch and shows that she can have a resting heart rate of 140.  Has been seen by electrophysiology.  Do think that a echocardiogram of the heart with a bubble study could be beneficial to rule out PFO.  Past medical history significant for MS that could be also a potential.  Could consider the possibility of sedimentation rate to see if there is any type of inflammation.  Patient wanted to hold on my part but will go with the echocardiogram and see if there is any other further evaluation and would have to follow-up with cardiology.

## 2024-02-05 NOTE — Assessment & Plan Note (Signed)
 Chronic problem noted, discussed icing regimen and home exercises, discussed which activities to do and which ones to avoid.  Patient has been continue to work on core strengthening.  Has done extremely well though with weight loss and I do think that this is helped out significantly.  Responds well to osteopathic ambulation.  Follow-up again in 6 to 12 weeks

## 2024-02-05 NOTE — Patient Instructions (Signed)
 Echo with bubble study See you again in 5 weeks

## 2024-02-12 ENCOUNTER — Other Ambulatory Visit: Payer: Self-pay

## 2024-02-12 ENCOUNTER — Telehealth: Payer: Self-pay | Admitting: Neurology

## 2024-02-12 DIAGNOSIS — Z79899 Other long term (current) drug therapy: Secondary | ICD-10-CM

## 2024-02-12 DIAGNOSIS — G35A Relapsing-remitting multiple sclerosis: Secondary | ICD-10-CM

## 2024-02-12 NOTE — Telephone Encounter (Signed)
 Pt is asking if the order for her labs can be sent again for [facility], they were unable to find them, please resend.

## 2024-02-13 ENCOUNTER — Ambulatory Visit: Payer: Self-pay | Admitting: Neurology

## 2024-02-13 LAB — CBC WITH DIFFERENTIAL/PLATELET
Basophils Absolute: 0 x10E3/uL (ref 0.0–0.2)
Basos: 1 %
EOS (ABSOLUTE): 0 x10E3/uL (ref 0.0–0.4)
Eos: 1 %
Hematocrit: 40.3 % (ref 34.0–46.6)
Hemoglobin: 12.3 g/dL (ref 11.1–15.9)
Immature Grans (Abs): 0 x10E3/uL (ref 0.0–0.1)
Immature Granulocytes: 0 %
Lymphocytes Absolute: 1.1 x10E3/uL (ref 0.7–3.1)
Lymphs: 26 %
MCH: 23.9 pg — ABNORMAL LOW (ref 26.6–33.0)
MCHC: 30.5 g/dL — ABNORMAL LOW (ref 31.5–35.7)
MCV: 78 fL — ABNORMAL LOW (ref 79–97)
Monocytes Absolute: 0.5 x10E3/uL (ref 0.1–0.9)
Monocytes: 12 %
Neutrophils Absolute: 2.5 x10E3/uL (ref 1.4–7.0)
Neutrophils: 59 %
Platelets: 337 x10E3/uL (ref 150–450)
RBC: 5.15 x10E6/uL (ref 3.77–5.28)
RDW: 13.6 % (ref 11.7–15.4)
WBC: 4.3 x10E3/uL (ref 3.4–10.8)

## 2024-02-22 ENCOUNTER — Other Ambulatory Visit (HOSPITAL_BASED_OUTPATIENT_CLINIC_OR_DEPARTMENT_OTHER): Payer: Self-pay

## 2024-02-22 MED ORDER — VALACYCLOVIR HCL 1 G PO TABS
2.0000 g | ORAL_TABLET | Freq: Two times a day (BID) | ORAL | 0 refills | Status: AC
  Filled 2024-02-22: qty 20, 5d supply, fill #0

## 2024-03-10 ENCOUNTER — Other Ambulatory Visit: Payer: Self-pay | Admitting: Neurology

## 2024-03-10 ENCOUNTER — Ambulatory Visit: Admitting: Family Medicine

## 2024-03-10 DIAGNOSIS — G35D Multiple sclerosis, unspecified: Secondary | ICD-10-CM

## 2024-03-10 NOTE — Progress Notes (Unsigned)
 " Darlyn Claudene [name] [address] [place] [address] Phone: [phone] Subjective:   Martha [name], am serving as a scribe for Dr. Arthea Claudene.  I'm seeing this patient by the request  of:  Patient, No Pcp Per  CC: Back and neck pain follow-up  YEP:Dlagzrupcz  Martha [name] is a 44 y.o. female coming in with complaint of back and neck pain. OMT 02/05/2024. Patient states that she is sore but is not sure why.  Patient has not done anything specific.  Nothing that stopping her from activity.  Patient states that overall though she has been doing relatively well with everything else.  Medications patient has been prescribed: None  Taking:      Recently did see neurologist and laboratory workup was unremarkable.   Reviewed prior external information including notes and imaging from previsou exam, outside providers and external EMR if available.   As well as notes that were available from care everywhere and other healthcare systems.  Past medical history, social, surgical and family history all reviewed in electronic medical record.  No pertanent information unless stated regarding to the chief complaint.   Past Medical History:  Diagnosis Date   Asthma    Lichen planus    MS (multiple sclerosis)    PCOS (polycystic ovarian syndrome)    Raynaud's disease     Allergies[1]   Review of Systems:  No headache, visual changes, nausea, vomiting, diarrhea, constipation, dizziness, abdominal pain, skin rash, fevers, chills, night sweats, weight loss, swollen lymph nodes, body aches, joint swelling, chest pain, shortness of breath, mood changes. POSITIVE muscle aches  Objective  Blood pressure 110/68, pulse 78, height 5' 5 (1.651 m), weight 125 lb (56.7 kg), SpO2 99%.   General: No apparent distress alert and oriented x3 mood and affect normal, dressed appropriately.  HEENT: Pupils equal, extraocular movements intact  Respiratory: Patient's speak  in full sentences and does not appear short of breath  Cardiovascular: No lower extremity edema, non tender, no erythema  Gait relatively normal MSK:  Back tightness noted in the low back.  Tenderness to palpation in the paraspinal musculature.  Tightness with FABER right greater than left.  Osteopathic findings  C2 flexed rotated and side bent right C7 flexed rotated and side bent right T3 extended rotated and side bent right inhaled rib T8 extended rotated and side bent left L2 flexed rotated and side bent right Sacrum right on right       Assessment and Plan:  Thoracic back pain Chronic but stable.  Discussed icing regimen and home exercises, discussed which activities to do and which ones to avoid.  Increase activity slowly.  Discussed icing regimen.  Follow-up again in 6 to 12 weeks.    Nonallopathic problems  Decision today to treat with OMT was based on Physical Exam  After verbal consent patient was treated with HVLA, ME, FPR techniques in cervical, rib, thoracic, lumbar, and sacral  areas  Patient tolerated the procedure well with improvement in symptoms  Patient given exercises, stretches and lifestyle modifications  See medications in patient instructions if given  Patient will follow up in 4-8 weeks     The above documentation has been reviewed and is accurate and complete Martha Haupt [name], DO         Note: This dictation was prepared with Dragon dictation along with smaller phrase technology. Any transcriptional errors that result from this process are unintentional.            [  1] No Known Allergies  "

## 2024-03-11 ENCOUNTER — Ambulatory Visit: Admitting: Family Medicine

## 2024-03-11 ENCOUNTER — Encounter: Payer: Self-pay | Admitting: Family Medicine

## 2024-03-11 VITALS — BP 110/68 | HR 78 | Ht 65.0 in | Wt 125.0 lb

## 2024-03-11 DIAGNOSIS — G8929 Other chronic pain: Secondary | ICD-10-CM

## 2024-03-11 DIAGNOSIS — M9904 Segmental and somatic dysfunction of sacral region: Secondary | ICD-10-CM | POA: Diagnosis not present

## 2024-03-11 DIAGNOSIS — M546 Pain in thoracic spine: Secondary | ICD-10-CM

## 2024-03-11 DIAGNOSIS — M9908 Segmental and somatic dysfunction of rib cage: Secondary | ICD-10-CM

## 2024-03-11 DIAGNOSIS — M9902 Segmental and somatic dysfunction of thoracic region: Secondary | ICD-10-CM

## 2024-03-11 DIAGNOSIS — M9901 Segmental and somatic dysfunction of cervical region: Secondary | ICD-10-CM | POA: Diagnosis not present

## 2024-03-11 DIAGNOSIS — M9903 Segmental and somatic dysfunction of lumbar region: Secondary | ICD-10-CM

## 2024-03-11 NOTE — Assessment & Plan Note (Signed)
 Chronic but stable.  Discussed icing regimen and home exercises, discussed which activities to do and which ones to avoid.  Increase activity slowly.  Discussed icing regimen.  Follow-up again in 6 to 12 weeks.

## 2024-03-25 ENCOUNTER — Ambulatory Visit (HOSPITAL_COMMUNITY)
Admission: RE | Admit: 2024-03-25 | Discharge: 2024-03-25 | Disposition: A | Discharge status: Home / Self Care | Source: Ambulatory Visit | Attending: Cardiology | Admitting: Cardiology

## 2024-03-25 DIAGNOSIS — R002 Palpitations: Secondary | ICD-10-CM | POA: Diagnosis present

## 2024-03-25 DIAGNOSIS — Q2112 Patent foramen ovale: Secondary | ICD-10-CM | POA: Diagnosis not present

## 2024-03-25 LAB — ECHOCARDIOGRAM COMPLETE BUBBLE STUDY
Area-P 1/2: 6.43 cm2
S' Lateral: 2.5 cm

## 2024-03-26 ENCOUNTER — Other Ambulatory Visit: Payer: Self-pay

## 2024-03-26 ENCOUNTER — Ambulatory Visit: Payer: Self-pay | Admitting: Family Medicine

## 2024-03-26 DIAGNOSIS — R519 Headache, unspecified: Secondary | ICD-10-CM

## 2024-03-26 DIAGNOSIS — R002 Palpitations: Secondary | ICD-10-CM

## 2024-03-30 ENCOUNTER — Other Ambulatory Visit (HOSPITAL_BASED_OUTPATIENT_CLINIC_OR_DEPARTMENT_OTHER): Payer: Self-pay

## 2024-04-09 ENCOUNTER — Ambulatory Visit: Admitting: Podiatry

## 2024-04-09 VITALS — Ht 65.0 in | Wt 125.0 lb

## 2024-04-09 DIAGNOSIS — L603 Nail dystrophy: Secondary | ICD-10-CM

## 2024-04-09 DIAGNOSIS — I7300 Raynaud's syndrome without gangrene: Secondary | ICD-10-CM

## 2024-04-09 NOTE — Progress Notes (Signed)
"  °  Subjective:  Patient ID: Martha [name], female    DOB: 1980/04/23,  MRN: [id number]  Chief Complaint  Patient presents with   Nail Problem    RM 5 Return in about 6 months (around 04/16/2024) for f/u nail treatment.    44 y.o. female presents with the above complaint. History confirmed with patient.  Nails are doing quite well.  Has been using the Diprolene  steroid ointment.  Raynaud's has been flaring.  Objective:  Physical Exam: Toes are cool to touch, no trophic changes or ulcerative lesions, normal DP and PT pulses, normal sensory exam, and significant improvement in bilateral hallux nails  Culture positive for onychomycosis            Assessment:   1. Nail dystrophy   2. Raynaud's disease without gangrene      Plan:  Patient was evaluated and treated and all questions answered.  Doing much better.  Continues on the Diprolene  ointment.  Refill as needed when she needs it.  Follow-up as needed for this or other issues.  No follow-ups on file.   "

## 2024-04-16 ENCOUNTER — Ambulatory Visit: Admitting: Podiatry

## 2024-04-23 ENCOUNTER — Ambulatory Visit: Admitting: Family Medicine

## 2024-04-29 ENCOUNTER — Ambulatory Visit: Admitting: Cardiology

## 2024-05-05 ENCOUNTER — Ambulatory Visit: Admitting: Neurology

## 2024-06-24 ENCOUNTER — Encounter

## 2024-07-01 ENCOUNTER — Ambulatory Visit: Admitting: Cardiology
